# Patient Record
Sex: Female | Born: 1975
Health system: Southern US, Community
[De-identification: ages and names within clinical notes are randomized; demographics above are authoritative.]

## PROBLEM LIST (undated history)

## (undated) DIAGNOSIS — J45909 Unspecified asthma, uncomplicated: Secondary | ICD-10-CM

## (undated) DIAGNOSIS — N3010 Interstitial cystitis (chronic) without hematuria: Secondary | ICD-10-CM

## (undated) DIAGNOSIS — K589 Irritable bowel syndrome without diarrhea: Secondary | ICD-10-CM

## (undated) DIAGNOSIS — R51 Headache: Secondary | ICD-10-CM

## (undated) DIAGNOSIS — M1990 Unspecified osteoarthritis, unspecified site: Secondary | ICD-10-CM

## (undated) DIAGNOSIS — R519 Headache, unspecified: Secondary | ICD-10-CM

## (undated) DIAGNOSIS — K219 Gastro-esophageal reflux disease without esophagitis: Secondary | ICD-10-CM

## (undated) DIAGNOSIS — R102 Pelvic and perineal pain: Secondary | ICD-10-CM

## (undated) DIAGNOSIS — Z803 Family history of malignant neoplasm of breast: Secondary | ICD-10-CM

## (undated) DIAGNOSIS — M542 Cervicalgia: Secondary | ICD-10-CM

## (undated) DIAGNOSIS — T7840XA Allergy, unspecified, initial encounter: Secondary | ICD-10-CM

## (undated) DIAGNOSIS — I10 Essential (primary) hypertension: Secondary | ICD-10-CM

## (undated) DIAGNOSIS — R011 Cardiac murmur, unspecified: Secondary | ICD-10-CM

## (undated) DIAGNOSIS — E785 Hyperlipidemia, unspecified: Secondary | ICD-10-CM

## (undated) DIAGNOSIS — N809 Endometriosis, unspecified: Secondary | ICD-10-CM

## (undated) DIAGNOSIS — G8929 Other chronic pain: Secondary | ICD-10-CM

## (undated) HISTORY — PX: TUBAL LIGATION: SHX77

## (undated) HISTORY — DX: Headache: R51

## (undated) HISTORY — DX: Unspecified osteoarthritis, unspecified site: M19.90

## (undated) HISTORY — DX: Pelvic and perineal pain: R10.2

## (undated) HISTORY — DX: Other chronic pain: G89.29

## (undated) HISTORY — DX: Hyperlipidemia, unspecified: E78.5

## (undated) HISTORY — DX: Headache, unspecified: R51.9

## (undated) HISTORY — DX: Interstitial cystitis (chronic) without hematuria: N30.10

## (undated) HISTORY — DX: Gastro-esophageal reflux disease without esophagitis: K21.9

## (undated) HISTORY — DX: Essential (primary) hypertension: I10

## (undated) HISTORY — DX: Irritable bowel syndrome, unspecified: K58.9

## (undated) HISTORY — DX: Cervicalgia: M54.2

## (undated) HISTORY — DX: Endometriosis, unspecified: N80.9

## (undated) HISTORY — DX: Family history of malignant neoplasm of breast: Z80.3

## (undated) HISTORY — DX: Cardiac murmur, unspecified: R01.1

## (undated) HISTORY — DX: Unspecified asthma, uncomplicated: J45.909

## (undated) HISTORY — PX: CHOLECYSTECTOMY: SHX55

## (undated) HISTORY — DX: Allergy, unspecified, initial encounter: T78.40XA

---

## 2001-12-27 ENCOUNTER — Other Ambulatory Visit: Admission: RE | Admit: 2001-12-27 | Discharge: 2001-12-27 | Payer: Self-pay | Admitting: Obstetrics & Gynecology

## 2004-04-10 HISTORY — PX: LAPAROSCOPY: SHX197

## 2004-08-31 ENCOUNTER — Ambulatory Visit: Payer: Self-pay | Admitting: Unknown Physician Specialty

## 2004-11-10 ENCOUNTER — Ambulatory Visit: Payer: Self-pay | Admitting: Unknown Physician Specialty

## 2005-08-21 ENCOUNTER — Emergency Department: Payer: Self-pay | Admitting: Emergency Medicine

## 2009-05-17 ENCOUNTER — Ambulatory Visit: Payer: Self-pay | Admitting: Obstetrics and Gynecology

## 2009-08-27 ENCOUNTER — Emergency Department: Payer: Self-pay | Admitting: Emergency Medicine

## 2009-09-04 ENCOUNTER — Emergency Department (HOSPITAL_COMMUNITY): Admission: EM | Admit: 2009-09-04 | Discharge: 2009-09-05 | Payer: Self-pay | Admitting: Emergency Medicine

## 2009-10-13 ENCOUNTER — Ambulatory Visit: Payer: Self-pay | Admitting: Gastroenterology

## 2009-10-15 LAB — PATHOLOGY REPORT

## 2010-01-05 ENCOUNTER — Ambulatory Visit: Payer: Self-pay | Admitting: Gastroenterology

## 2010-08-04 ENCOUNTER — Other Ambulatory Visit: Payer: Self-pay | Admitting: Obstetrics & Gynecology

## 2010-08-04 DIAGNOSIS — N939 Abnormal uterine and vaginal bleeding, unspecified: Secondary | ICD-10-CM

## 2010-08-17 ENCOUNTER — Ambulatory Visit (HOSPITAL_COMMUNITY)
Admission: RE | Admit: 2010-08-17 | Discharge: 2010-08-17 | Disposition: A | Discharge status: Home / Self Care | Payer: BC Managed Care – PPO | Source: Ambulatory Visit | Attending: Obstetrics & Gynecology | Admitting: Obstetrics & Gynecology

## 2010-08-17 ENCOUNTER — Ambulatory Visit (HOSPITAL_COMMUNITY): Payer: BC Managed Care – PPO

## 2010-08-17 DIAGNOSIS — N925 Other specified irregular menstruation: Secondary | ICD-10-CM | POA: Insufficient documentation

## 2010-08-17 DIAGNOSIS — N803 Endometriosis of pelvic peritoneum, unspecified: Secondary | ICD-10-CM | POA: Insufficient documentation

## 2010-08-17 DIAGNOSIS — N938 Other specified abnormal uterine and vaginal bleeding: Secondary | ICD-10-CM | POA: Insufficient documentation

## 2010-08-17 DIAGNOSIS — N949 Unspecified condition associated with female genital organs and menstrual cycle: Secondary | ICD-10-CM | POA: Insufficient documentation

## 2010-08-17 DIAGNOSIS — N939 Abnormal uterine and vaginal bleeding, unspecified: Secondary | ICD-10-CM

## 2010-08-23 NOTE — Assessment & Plan Note (Signed)
NAME:  Anita Weaver, Anita Weaver                   ACCOUNT NO.:  [id number]   MEDICAL RECORD NO.:  [id number]          PATIENT TYPE:  POB   LOCATION:  [facility]         FACILITY:  [facility]   PHYSICIAN:  Argentina Donovan, MD        DATE OF BIRTH:  November 21, 1975   DATE OF SERVICE:  05/17/2009                                  CLINIC NOTE   The patient is a 35 year old Caucasian female gravida 3, para 3-0-0-3  with youngest child is 10, who had a tubal ligation after the birth of  her youngest child who is 10.  The patient in 2006 had a laparoscopy for  chronic pelvic pain and gallbladder disease.  She had a gallbladder  removed at that time.  At that point, she had a tubal ligation by  laparoscopy and was told by the gynecologist then that she had severe  endometriosis.  At that point she was only having painful periods but  that has developed since then into chronic pelvic pain even in between  periods as well as much more discomfort with her period.  Her pain is  localized to the lower abdomen bilaterally and it radiates to the lower  back.   PHYSICAL EXAMINATION:  ABDOMEN:  Soft, flat, slightly tender to deep  palpation in the lower abdomen but no guarding or rebound.  GU:  External genitalia is normal.  BUS within normal limits.  Vagina is  clean and well rugated.  Cervix clean and parous, it has been far off  anterior.  Bimanual pelvic examination, the uterosacral ligaments are  both covered with palpable nodules although the cul-de-sac seemed like  it is free and the uterus is in first-degree retroversion.  Adnexa could  not be outlined.   My plan is to obtain an ultrasound on this patient, we obtained the  records of her laparoscopy.  She also states that the other doctor has  photographs that were taken at the time of her laparoscopy which she is  going to obtain herself and I am going to have her come back in 2 weeks  for evaluation by one of her doctors at the surgery at this point.  I  talked to the patient about treatments and at the fact that it probably  would be best off with total abdominal hysterectomy and possibly removal  of the ovaries.  We talked about the pros and cons of removing the  ovaries in detail and if one is left often times, it means a second  operation down the line.  She does not seem like she wants that done.  I  have also told her that sometimes we will use nipple Lupron to soften  things up and make the surgery a little bit easier possibly and  preparation for the surgery especially if the ultrasound is  significantly abnormal.  She apparently has researched endometriosis  quite a bit since she was told she had and seems fairly educated to the  disease.   Impression is severe symptomatic pelvic endometriosis, probably needs  treatment with eventual hysterectomy.           ______________________________  Loistine Chance  Okey Dupre, MD     PR/MEDQ  D:  05/17/2009  T:  05/18/2009  Job:  [id number]

## 2011-08-23 ENCOUNTER — Other Ambulatory Visit: Payer: Self-pay | Admitting: Neurology

## 2011-08-23 DIAGNOSIS — R51 Headache: Secondary | ICD-10-CM

## 2011-09-02 ENCOUNTER — Ambulatory Visit
Admission: RE | Admit: 2011-09-02 | Discharge: 2011-09-02 | Disposition: A | Discharge status: Home / Self Care | Payer: BC Managed Care – PPO | Source: Ambulatory Visit | Attending: Neurology | Admitting: Neurology

## 2011-09-02 DIAGNOSIS — R51 Headache: Secondary | ICD-10-CM

## 2011-09-08 ENCOUNTER — Ambulatory Visit: Payer: Self-pay | Admitting: Internal Medicine

## 2011-11-01 ENCOUNTER — Ambulatory Visit: Payer: Self-pay

## 2011-11-01 LAB — RAPID STREP-A WITH REFLX: Micro Text Report: NEGATIVE

## 2011-11-04 LAB — BETA STREP CULTURE(ARMC)

## 2011-11-11 ENCOUNTER — Ambulatory Visit: Payer: Self-pay | Admitting: Internal Medicine

## 2011-11-11 LAB — RAPID STREP-A WITH REFLX: Micro Text Report: NEGATIVE

## 2011-11-14 LAB — BETA STREP CULTURE(ARMC)

## 2011-12-25 ENCOUNTER — Emergency Department: Payer: Self-pay | Admitting: Emergency Medicine

## 2012-01-12 ENCOUNTER — Encounter: Payer: BC Managed Care – PPO | Admitting: Obstetrics and Gynecology

## 2012-03-19 ENCOUNTER — Other Ambulatory Visit: Payer: BC Managed Care – PPO

## 2012-03-26 ENCOUNTER — Ambulatory Visit: Payer: BC Managed Care – PPO | Admitting: Family Medicine

## 2012-03-29 ENCOUNTER — Ambulatory Visit: Payer: BC Managed Care – PPO | Admitting: Obstetrics and Gynecology

## 2012-03-29 DIAGNOSIS — N899 Noninflammatory disorder of vagina, unspecified: Secondary | ICD-10-CM

## 2012-04-08 ENCOUNTER — Ambulatory Visit: Payer: BC Managed Care – PPO | Admitting: Family Medicine

## 2012-04-08 DIAGNOSIS — N899 Noninflammatory disorder of vagina, unspecified: Secondary | ICD-10-CM

## 2012-04-18 ENCOUNTER — Ambulatory Visit (INDEPENDENT_AMBULATORY_CARE_PROVIDER_SITE_OTHER): Payer: BC Managed Care – PPO | Admitting: Obstetrics and Gynecology

## 2012-04-18 ENCOUNTER — Encounter: Payer: Self-pay | Admitting: Obstetrics and Gynecology

## 2012-04-18 VITALS — BP 117/83 | HR 87 | Ht 68.0 in | Wt 155.0 lb

## 2012-04-18 DIAGNOSIS — G8929 Other chronic pain: Secondary | ICD-10-CM | POA: Insufficient documentation

## 2012-04-18 DIAGNOSIS — N949 Unspecified condition associated with female genital organs and menstrual cycle: Secondary | ICD-10-CM

## 2012-04-18 DIAGNOSIS — N809 Endometriosis, unspecified: Secondary | ICD-10-CM | POA: Insufficient documentation

## 2012-04-18 DIAGNOSIS — N760 Acute vaginitis: Secondary | ICD-10-CM

## 2012-04-18 LAB — POCT URINALYSIS DIPSTICK
Bilirubin, UA: NEGATIVE
Glucose, UA: NEGATIVE
Ketones, UA: NEGATIVE
Leukocytes, UA: NEGATIVE
Nitrite, UA: NEGATIVE
Protein, UA: NEGATIVE
Spec Grav, UA: 1.015
Urobilinogen, UA: 0.2
pH, UA: 5

## 2012-04-18 MED ORDER — MEDROXYPROGESTERONE ACETATE 150 MG/ML IM SUSP: 150.0000 mg | INTRAMUSCULAR | Status: DC

## 2012-04-18 MED ORDER — HYDROCODONE-ACETAMINOPHEN 5-500 MG PO TABS: 1.0000 | ORAL_TABLET | Freq: Four times a day (QID) | ORAL | Status: DC | PRN

## 2012-04-18 NOTE — Progress Notes (Signed)
Patient ID: Chavie Kolinski, female   DOB: [birth year], 37 y.o.   MRN: [id number] 37 yo G3P3 with LMP 12/23 presenting today for evaluation of pelvic pain and vaginal discharge. Patient reports onset of vaginal pruritis last week with the presence of a discharge with odor. Patient is in a monogamous relationship using BTL for birth control. Patient also reports h/o endometriosis diagnosed at the time of her tubal ligation in 2006. Patient states OCP did not work to manage her pain. She took depo-Lupron for 1 year but could not tolerate the side effects. Patient was planning on using Mirena IUD but could not afford the deductible. Patient states her pain use to be worst at the time of her period but now is Rohin Krejci and aggravated with her periods.  GENERAL: Well-developed, well-nourished female in no acute distress.  ABDOMEN: Soft, nontender, nondistended. No organomegaly. PELVIC: Normal external female genitalia. Vagina is pink and rugated.  Normal discharge. Normal appearing cervix. Uterus is normal in size and tender to touch. No adnexal mass or tenderness. EXTREMITIES: No cyanosis, clubbing, or edema, 2+ distal pulses.  A/P 37 yo with endometriosis and pelvic pain - Wet prep collected for her vaginitis - Long discussion regarding the use of birth control method to better manage her endometriosis- Patient is willing to try depo-provera while saving up for Mirena IUD -Rx Vicodin provided - Patient understands that definitive treatment involves hysterectomy with BSO. - Patient will be contacted with any abnormal results. -UA showed moderate blood in urine. Patient states all her urine test always show blood in her urine. Advised to follow up with PCP for possible urology eval.

## 2012-04-19 LAB — WET PREP, GENITAL
Trich, Wet Prep: NONE SEEN
Yeast Wet Prep HPF POC: NONE SEEN

## 2012-04-20 LAB — URINE CULTURE: Colony Count: 2000

## 2012-04-23 MED ORDER — METRONIDAZOLE 500 MG PO TABS: 500.0000 mg | ORAL_TABLET | Freq: Two times a day (BID) | ORAL | Status: DC

## 2012-04-23 NOTE — Addendum Note (Signed)
Addended by: Catalina Antigua on: 04/23/2012 11:40 AM   Modules accepted: Orders

## 2012-04-25 ENCOUNTER — Telehealth: Payer: Self-pay | Admitting: *Deleted

## 2012-04-25 DIAGNOSIS — B9689 Other specified bacterial agents as the cause of diseases classified elsewhere: Secondary | ICD-10-CM

## 2012-04-25 DIAGNOSIS — N760 Acute vaginitis: Secondary | ICD-10-CM

## 2012-04-25 MED ORDER — METRONIDAZOLE 500 MG PO TABS: 500.0000 mg | ORAL_TABLET | Freq: Two times a day (BID) | ORAL | Status: DC

## 2012-04-25 NOTE — Telephone Encounter (Signed)
Patient needs medication called into [facility] instead of [facility].

## 2012-07-18 ENCOUNTER — Ambulatory Visit: Payer: BC Managed Care – PPO | Admitting: Obstetrics & Gynecology

## 2012-08-05 ENCOUNTER — Ambulatory Visit (INDEPENDENT_AMBULATORY_CARE_PROVIDER_SITE_OTHER): Payer: BC Managed Care – PPO | Admitting: Family Medicine

## 2012-08-05 ENCOUNTER — Encounter: Payer: Self-pay | Admitting: Family Medicine

## 2012-08-05 VITALS — BP 136/85 | HR 73 | Ht 68.0 in | Wt 165.0 lb

## 2012-08-05 DIAGNOSIS — N809 Endometriosis, unspecified: Secondary | ICD-10-CM

## 2012-08-05 DIAGNOSIS — R102 Pelvic and perineal pain: Secondary | ICD-10-CM

## 2012-08-05 DIAGNOSIS — N898 Other specified noninflammatory disorders of vagina: Secondary | ICD-10-CM

## 2012-08-05 DIAGNOSIS — G8929 Other chronic pain: Secondary | ICD-10-CM

## 2012-08-05 DIAGNOSIS — N949 Unspecified condition associated with female genital organs and menstrual cycle: Secondary | ICD-10-CM

## 2012-08-05 MED ORDER — SULINDAC 200 MG PO TABS: 200.0000 mg | ORAL_TABLET | Freq: Two times a day (BID) | ORAL | Status: DC

## 2012-08-05 MED ORDER — NORETHINDRONE ACETATE 5 MG PO TABS: 5.0000 mg | ORAL_TABLET | Freq: Every day | ORAL | Status: DC

## 2012-08-05 MED ORDER — METRONIDAZOLE 500 MG PO TABS: 500.0000 mg | ORAL_TABLET | Freq: Two times a day (BID) | ORAL | Status: AC

## 2012-08-05 NOTE — Assessment & Plan Note (Signed)
Check wet prep--rx for flagyl given.  Ways to avoid in the future discussed.

## 2012-08-05 NOTE — Progress Notes (Signed)
Discuss treatment for endometriosis and also believes bacterial vaginitis is back.

## 2012-08-05 NOTE — Patient Instructions (Addendum)
Bacterial Vaginosis Bacterial vaginosis (BV) is a vaginal infection where the normal balance of bacteria in the vagina is disrupted. The normal balance is then replaced by an overgrowth of certain bacteria. There are several different kinds of bacteria that can cause BV. BV is the most common vaginal infection in women of childbearing age. CAUSES   The cause of BV is not fully understood. BV develops when there is an increase or imbalance of harmful bacteria.  Some activities or behaviors can upset the normal balance of bacteria in the vagina and put women at increased risk including:  Having a new sex partner or multiple sex partners.  Douching.  Using an intrauterine device (IUD) for contraception.  It is not clear what role sexual activity plays in the development of BV. However, women that have never had sexual intercourse are rarely infected with BV. Women do not get BV from toilet seats, bedding, swimming pools or from touching objects around them.  SYMPTOMS   Grey vaginal discharge.  A fish-like odor with discharge, especially after sexual intercourse.  Itching or burning of the vagina and vulva.  Burning or pain with urination.  Some women have no signs or symptoms at all. DIAGNOSIS  Your caregiver must examine the vagina for signs of BV. Your caregiver will perform lab tests and look at the sample of vaginal fluid through a microscope. They will look for bacteria and abnormal cells (clue cells), a pH test higher than 4.5, and a positive amine test all associated with BV.  RISKS AND COMPLICATIONS   Pelvic inflammatory disease (PID).  Infections following gynecology surgery.  Developing HIV.  Developing herpes virus. TREATMENT  Sometimes BV will clear up without treatment. However, all women with symptoms of BV should be treated to avoid complications, especially if gynecology surgery is planned. Female partners generally do not need to be treated. However, BV may spread  between female sex partners so treatment is helpful in preventing a recurrence of BV.   BV may be treated with antibiotics. The antibiotics come in either pill or vaginal cream forms. Either can be used with nonpregnant or pregnant women, but the recommended dosages differ. These antibiotics are not harmful to the baby.  BV can recur after treatment. If this happens, a second round of antibiotics will often be prescribed.  Treatment is important for pregnant women. If not treated, BV can cause a premature delivery, especially for a pregnant woman who had a premature birth in the past. All pregnant women who have symptoms of BV should be checked and treated.  For chronic reoccurrence of BV, treatment with a type of prescribed gel vaginally twice a week is helpful. HOME CARE INSTRUCTIONS   Finish all medication as directed by your caregiver.  Do not have sex until treatment is completed.  Tell your sexual partner that you have a vaginal infection. They should see their caregiver and be treated if they have problems, such as a mild rash or itching.  Practice safe sex. Use condoms. Only have 1 sex partner. PREVENTION  Basic prevention steps can help reduce the risk of upsetting the natural balance of bacteria in the vagina and developing BV:  Do not have sexual intercourse (be abstinent).  Do not douche.  Use all of the medicine prescribed for treatment of BV, even if the signs and symptoms go away.  Tell your sex partner if you have BV. That way, they can be treated, if needed, to prevent reoccurrence. SEEK MEDICAL CARE IF:     Your symptoms are not improving after 3 days of treatment.  You have increased discharge, pain, or fever. MAKE SURE YOU:   Understand these instructions.  Will watch your condition.  Will get help right away if you are not doing well or get worse. FOR MORE INFORMATION  Division of STD Prevention (DSTDP), Centers for Disease Control and Prevention:  SolutionApps.co.za American Social Health Association (ASHA): www.ashastd.org  Document Released: 03/27/2005 Document Revised: 06/19/2011 Document Reviewed: 09/17/2008 [facility] Patient Information 2013 [place], [place]. Endometriosis Endometriosis is a disease that occurs when the endometrium (lining of the uterus) is misplaced outside of its normal location. It may occur in many locations close to the uterus (womb), but commonly on the ovaries, fallopian tubes, vagina (birth canal) and bowel located close to the uterus. Because the uterus sloughs (expels) its lining every month (menses), there is bleeding whereever the endometrial tissue is located. SYMPTOMS  Often there are no symptoms. However, because blood is irritating to tissues not normally exposed to it, when symptoms occur they vary with the location of the misplaced endometrium. Symptoms often include back and abdominal pain. Periods may be heavier and intercourse may be painful. Infertility may be present. You may have all of these symptoms at one time or another or you may have months with no symptoms at all. Although the symptoms occur mainly during menses, they can occur mid-cycle as well, and usually terminate with menopause. DIAGNOSIS  Your caregiver may recommend a blood test and urine test (urinalysis) to help rule out other conditions. Another common test is ultrasound, a painless procedure that uses sound waves to make a sonogram "picture" of abnormal tissue that could be endometriosis. If your bowel movements are painful around your periods, your caregiver may advise a barium enema (an X-ray of the lower bowel), to try to find the source of your pain. This is sometimes confirmed by laparoscopy. Laparoscopy is a procedure where your caregiver looks into your abdomen with a laparoscope (a small pencil sized telescope). Your caregiver may take a tiny piece of tissue (biopsy) from any abnormal tissue to confirm or document your problem. These  tissues are sent to the lab and a pathologist looks at them under the microscope to give a microscopic diagnosis. TREATMENT  Once the diagnosis is made, it can be treated by destruction of the misplaced endometrial tissue using heat (diathermy), laser, cutting (excision), or chemical means. It may also be treated with hormonal therapy. When using hormonal therapy menses are eliminated, therefore eliminating the monthly exposure to blood by the misplaced endometrial tissue. Only in severe cases is it necessary to perform a hysterectomy with removal of the tubes, uterus and ovaries. HOME CARE INSTRUCTIONS  Only take over-the-counter or prescription medicines for pain, discomfort, or fever as directed by your caregiver. Avoid activities that produce pain, including a physical sexual relationship. Do not take aspirin as this may increase bleeding when not on hormonal therapy. See your caregiver for pain or problems not controlled with treatment. SEEK IMMEDIATE MEDICAL CARE IF:  Your pain is severe and is not responding to pain medicine. You develop severe nausea and vomiting, or you cannot keep foods down. Your pain localizes to the right lower part of your abdomen (possible appendicitis). You have swelling or increasing pain in the abdomen. You have a fever. You see blood in your stool. MAKE SURE YOU:  Understand these instructions. Will watch your condition. Will get help right away if you are not doing well or get worse. Document  Released: 03/24/2000 Document Revised: 06/19/2011 Document Reviewed: 11/13/2007 [facility] Patient Information 2013 [place], [place].

## 2012-08-05 NOTE — Progress Notes (Signed)
  Subjective:    Patient ID: Anita Weaver, female    DOB: 1975/04/15, 37 y.o.   MRN: [id number]  HPI Here today with 2 issues 1.  Vaginal irritation, and odor.  Like prev. BV.  Frequent sit-down baths. 2.  Endometriosis-prev. Been on and not tolerated Depo-Lupron, considering Mirena IUD, not wanting to have high co-pay.  Recent worsening of pain, previously cyclical, now more prominent.  On Ibuprofen and likely taking too much. Has IC as well, but unwilling to go for further treatment of this.   Review of Systems  Constitutional: Negative for fever and chills.  Respiratory: Negative for shortness of breath.   Gastrointestinal: Positive for abdominal pain.  Genitourinary: Positive for dysuria and dyspareunia.  Skin: Negative for rash.       Objective:   Physical Exam  Vitals reviewed. Constitutional: She is oriented to person, place, and time. She appears well-developed and well-nourished.  HENT:  Head: Normocephalic and atraumatic.  Eyes: No scleral icterus.  Neck: Neck supple.  Cardiovascular: Normal rate.   Abdominal: Soft. There is no tenderness.  Genitourinary: Vagina normal. Uterus is tender. Right adnexum displays tenderness. Right adnexum displays no mass. Left adnexum displays tenderness. Left adnexum displays no mass.  Neurological: She is alert and oriented to person, place, and time.          Assessment & Plan:

## 2012-08-05 NOTE — Assessment & Plan Note (Signed)
Trial of Aygestin, change to twice daily dosing of NSAIDS to decrease damage done.  She will consider long-term plan of Hyst + ovarian removal.  Likelihood of success discussed with IC and long-term pain.

## 2012-08-06 LAB — WET PREP, GENITAL
Clue Cells Wet Prep HPF POC: NONE SEEN
Trich, Wet Prep: NONE SEEN
Yeast Wet Prep HPF POC: NONE SEEN

## 2012-12-12 ENCOUNTER — Ambulatory Visit (INDEPENDENT_AMBULATORY_CARE_PROVIDER_SITE_OTHER): Payer: Self-pay | Admitting: Obstetrics & Gynecology

## 2012-12-12 ENCOUNTER — Encounter: Payer: Self-pay | Admitting: Obstetrics & Gynecology

## 2012-12-12 VITALS — BP 123/93 | HR 66 | Ht 68.0 in | Wt 170.0 lb

## 2012-12-12 DIAGNOSIS — N898 Other specified noninflammatory disorders of vagina: Secondary | ICD-10-CM

## 2012-12-12 DIAGNOSIS — N390 Urinary tract infection, site not specified: Secondary | ICD-10-CM

## 2012-12-12 DIAGNOSIS — B373 Candidiasis of vulva and vagina: Secondary | ICD-10-CM

## 2012-12-12 LAB — POCT URINALYSIS DIPSTICK
Bilirubin, UA: NEGATIVE
Glucose, UA: NEGATIVE
Ketones, UA: NEGATIVE
Nitrite, UA: NEGATIVE
Protein, UA: NEGATIVE
Spec Grav, UA: >=1.03
Urobilinogen, UA: NEGATIVE
pH, UA: 6

## 2012-12-12 NOTE — Progress Notes (Signed)
GYNECOLOGY CLINIC ENCOUNTER NOTE  History:  37 y.o. G3P3 here today for evaluation of malodorous vaginal discharge x 3 weeks. Denies itching. No other symptoms.  The following portions of the patient's history were reviewed and updated as appropriate: allergies, current medications, past family history, past medical history, past social history, past surgical history and problem list. No pap smear recently.  Review of Systems:  Pertinent items are noted in HPI.  Objective:  Physical Exam BP 123/93  Pulse 66  Ht 5\' 8"  (1.727 m)  Wt 170 lb (77.111 kg)  BMI 25.85 kg/m2  LMP 12/02/2012 Gen: NAD Abd: Soft, nontender and nondistended Pelvic: Normal appearing external genitalia; normal appearing vaginal mucosa and cervix.  Small amount of white, nonodorous vaginal discharge noted, sample obtained for wet prep.  Small uterus, no other palpable masses, no uterine or adnexal tenderness  Assessment & Plan:  Will follow up results and manage accordingly. Proper vulvar hygiene emphasized: discussed avoidance of perfumed soaps, detergents, lotions and any type of douches; in addition to wearing cotton underwear and no underwear at night.   Routine preventative health maintenance measures emphasized, patient will schedule annual physical visit soon.

## 2012-12-12 NOTE — Addendum Note (Signed)
Addended by: Barbara Cower on: 12/12/2012 02:56 PM   Modules accepted: Orders

## 2012-12-12 NOTE — Patient Instructions (Signed)
Return to clinic for any scheduled appointments or for any gynecologic concerns as needed.   

## 2012-12-13 LAB — WET PREP, GENITAL
Clue Cells Wet Prep HPF POC: NONE SEEN
Trich, Wet Prep: NONE SEEN
WBC, Wet Prep HPF POC: NONE SEEN

## 2012-12-13 LAB — URINE CULTURE
Colony Count: NO GROWTH
Organism ID, Bacteria: NO GROWTH

## 2012-12-13 MED ORDER — FLUCONAZOLE 150 MG PO TABS: 150.0000 mg | ORAL_TABLET | Freq: Once | ORAL | Status: DC

## 2012-12-13 NOTE — Addendum Note (Signed)
Addended by: Jaynie Collins A on: 12/13/2012 06:01 PM   Modules accepted: Orders

## 2012-12-16 ENCOUNTER — Telehealth: Payer: Self-pay | Admitting: *Deleted

## 2012-12-16 NOTE — Telephone Encounter (Signed)
Patient given test results and she will pick up medication at her pharmacy for Diflucan.

## 2013-03-03 ENCOUNTER — Emergency Department: Payer: Self-pay | Admitting: Emergency Medicine

## 2013-03-03 LAB — URINALYSIS, COMPLETE
Bilirubin,UR: NEGATIVE
Glucose,UR: NEGATIVE mg/dL (ref 0–75)
Ketone: NEGATIVE
Leukocyte Esterase: NEGATIVE
Nitrite: NEGATIVE
Ph: 7 (ref 4.5–8.0)
Protein: NEGATIVE
RBC,UR: 9 /HPF (ref 0–5)
Specific Gravity: 1.021 (ref 1.003–1.030)
Squamous Epithelial: 2
WBC UR: 2 /HPF (ref 0–5)

## 2013-03-03 LAB — CBC WITH DIFFERENTIAL/PLATELET
Basophil #: 0 10*3/uL (ref 0.0–0.1)
Basophil %: 0.6 %
Eosinophil #: 0.2 10*3/uL (ref 0.0–0.7)
Eosinophil %: 2.8 %
HCT: 40.2 % (ref 35.0–47.0)
HGB: 13.8 g/dL (ref 12.0–16.0)
Lymphocyte #: 2.9 10*3/uL (ref 1.0–3.6)
Lymphocyte %: 36.6 %
MCH: 29.3 pg (ref 26.0–34.0)
MCHC: 34.4 g/dL (ref 32.0–36.0)
MCV: 85 fL (ref 80–100)
Monocyte #: 0.6 x10 3/mm (ref 0.2–0.9)
Monocyte %: 7.8 %
Neutrophil #: 4.1 10*3/uL (ref 1.4–6.5)
Neutrophil %: 52.2 %
Platelet: 288 10*3/uL (ref 150–440)
RBC: 4.71 10*6/uL (ref 3.80–5.20)
RDW: 13.3 % (ref 11.5–14.5)
WBC: 7.8 10*3/uL (ref 3.6–11.0)

## 2013-03-03 LAB — LIPASE, BLOOD: Lipase: 144 U/L (ref 73–393)

## 2013-03-03 LAB — COMPREHENSIVE METABOLIC PANEL
Albumin: 3.8 g/dL (ref 3.4–5.0)
Alkaline Phosphatase: 41 U/L — ABNORMAL LOW
Anion Gap: 6 — ABNORMAL LOW (ref 7–16)
BUN: 11 mg/dL (ref 7–18)
Bilirubin,Total: 0.4 mg/dL (ref 0.2–1.0)
Calcium, Total: 8.8 mg/dL (ref 8.5–10.1)
Chloride: 105 mmol/L (ref 98–107)
Co2: 28 mmol/L (ref 21–32)
Creatinine: 0.88 mg/dL (ref 0.60–1.30)
EGFR (African American): >60
EGFR (Non-African Amer.): >60
Glucose: 106 mg/dL — ABNORMAL HIGH (ref 65–99)
Osmolality: 277 (ref 275–301)
Potassium: 3.7 mmol/L (ref 3.5–5.1)
SGOT(AST): 8 U/L — ABNORMAL LOW (ref 15–37)
SGPT (ALT): 15 U/L (ref 12–78)
Sodium: 139 mmol/L (ref 136–145)
Total Protein: 7.3 g/dL (ref 6.4–8.2)

## 2013-03-03 LAB — GC/CHLAMYDIA PROBE AMP

## 2013-03-03 LAB — WET PREP, GENITAL

## 2013-03-05 LAB — URINE CULTURE

## 2013-05-08 ENCOUNTER — Ambulatory Visit (INDEPENDENT_AMBULATORY_CARE_PROVIDER_SITE_OTHER): Payer: 59 | Admitting: Neurology

## 2013-05-08 ENCOUNTER — Encounter: Payer: Self-pay | Admitting: Neurology

## 2013-05-08 ENCOUNTER — Encounter (INDEPENDENT_AMBULATORY_CARE_PROVIDER_SITE_OTHER): Payer: Self-pay

## 2013-05-08 VITALS — BP 110/77 | HR 79 | Ht 67.0 in | Wt 170.0 lb

## 2013-05-08 DIAGNOSIS — G43909 Migraine, unspecified, not intractable, without status migrainosus: Secondary | ICD-10-CM

## 2013-05-08 DIAGNOSIS — F319 Bipolar disorder, unspecified: Secondary | ICD-10-CM

## 2013-05-08 MED ORDER — RIZATRIPTAN BENZOATE 10 MG PO TBDP: 10.0000 mg | ORAL_TABLET | ORAL | Status: DC | PRN

## 2013-05-08 MED ORDER — DIVALPROEX SODIUM ER 500 MG PO TB24: 500.0000 mg | ORAL_TABLET | Freq: Every day | ORAL | Status: DC

## 2013-05-08 NOTE — Progress Notes (Signed)
[facility]  PATIENT: Anita Weaver DOB: 01/15/1976  HISTORICAL  Anita Weaver is a 38 years old right-handed Caucasian female, accompanied by her husband, referred by her primary care physician Dr. Manuella Ghazi for evaluation of migraine headaches  She reported a history of migraine headaches since age 64, at that time, she also has excessive stress, for a while, she been having headaches daily, since her separation with her previous husband, her headache has much improved, doing very well from 2013-2014, but since October 2014, she began to get migraine again, gradually getting worse, to almost daily basis, she has been taking Excedrin Migraine every day 2-4 tablets, her migraine headaches are usually lateralized severe pounding headaches with associated light noise sensitivity, nauseous, lasting for days,  Previously she has tried different preventive medications including Topamax, moderate help, nortriptyline, no help, there was an 1 medication prescribed by her psychologist for her bipolar disorder, has been very helpful for him migraine, but she could not remember the name  Over-the-counter Tylenol, ibuprofen and is no longer helpful,  Excedrin migraine only provide mild temporary relief, she has tried triptan treatment in the past, Imitrex causing heart racing, sweaty, difficulty breathing, Maxalt has been very helpful, but she was not able afford it because the high co-pay, Flexeril has  Made her sleepy  Trigger for her migraines, weather change, stress, caffeine, sleep deprivation  She had tubal ligations now, she also complains of worsening depression over the past few months, will set up appointment with her psychologist, she is not not on any medications   REVIEW OF SYSTEMS: Full 14 system review of systems performed and notable only for urination problems, eye pain, cough, easy bruising, achy muscles, energy, headaches, insomnia, restless leg, depression, anxiety, not enough  sleep  ALLERGIES: Allergies  Allergen Reactions  . Demerol [Meperidine]     HOME MEDICATIONS: No outpatient prescriptions prior to visit.   No facility-administered medications prior to visit.    PAST MEDICAL HISTORY: History reviewed. No pertinent past medical history.  PAST SURGICAL HISTORY: Past Surgical History  Procedure Laterality Date  . Cholecystectomy    . Tubal ligation      FAMILY HISTORY: Family History  Problem Relation Age of Onset  . Cancer Mother   . Cancer Sister     SOCIAL HISTORY:  History   Social History  . Marital Status: Unknown    Spouse Name: N/A    Number of Children: 3  . 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 Smoking status: Never Smoker   . Smokeless tobacco: Never Used  . Alcohol Use: No  . Drug Use: No  . Sexual Activity: Not on file   Other Topics Concern  . Not on file   Social History Narrative   Patient lives at home with [name]   Patient works full time.   Right handed.   Caffeine- none   Education- high school     PHYSICAL EXAM   Filed Vitals:   05/08/13 1055  BP: 110/77  Pulse: 79  Height: 5\' 7"  (1.702 m)  Weight: 170 lb (77.111 kg)    Not recorded    Body mass index is 26.62 kg/(m^2).   Generalized: In no acute distress  Neck: Supple, no carotid bruits   Cardiac: Regular rate rhythm  Pulmonary: Clear to auscultation bilaterally  Musculoskeletal: No deformity  Neurological examination  Mentation: Alert  oriented to time, place, history taking, and causual conversation, mild distress, sitting in darkroom  Cranial nerve II-XII: Pupils were equal round reactive to light extraocular movements were full, Visual field were full on confrontational test. Bilateral fundi were sharp.  Facial sensation and strength were normal. Hearing was intact to finger rubbing bilaterally. Uvula tongue midline.  head turning and shoulder shrug  and were normal and symmetric.Tongue protrusion into cheek strength was normal.  Motor: normal tone, bulk and strength.  Sensory: Intact to fine touch, pinprick, preserved vibratory sensation, and proprioception at toes.  Coordination: Normal finger to nose, heel-to-shin bilaterally there was no truncal ataxia  Gait: Rising up from seated position without assistance, normal stance, without trunk ataxia, moderate stride, good arm swing, smooth turning, able to perform tiptoe, and heel walking without difficulty.   Romberg signs: Negative  Deep tendon reflexes: Brachioradialis 2/2, biceps 2/2, triceps 2/2, patellar 2/2, Achilles 2/2, plantar responses were flexor bilaterally.   DIAGNOSTIC DATA (LABS, IMAGING, TESTING) - I reviewed patient records, labs, notes, testing and imaging myself where available.   ASSESSMENT AND PLAN   21 some years old Caucasian female, with history of migraine, bipolar disorder, presenting with frequent migraine headaches, normal neurological examination  1, starting preventive medications, Depakote ER 500 mg every night, potential side effects explained, she has tubal ligation,  2, Maxalt as needed,  3, return to clinic with Hoyle Sauer 2-3 months     Marcial Pacas, M.D. Ph.D.  [facility] [address] [place], [place] [address] [phone] 2 with equal a is on As you: Had a middle school as

## 2013-07-11 ENCOUNTER — Telehealth: Payer: Self-pay | Admitting: Neurology

## 2013-07-11 NOTE — Telephone Encounter (Signed)
Patient was taking Depakote ER for migraines-after patient did research on drug stated this drug is very bad for her liver--can another Rx be called in--patient would also like Rx called in for Maxalt with quantity of more than 15 pills--thank you.

## 2013-07-15 NOTE — Telephone Encounter (Signed)
Seems like she has tried and failed several preventative medications hence if Depakote ER is working for her I recommend she stay on it and discuss this with Dr. Krista Blue further after her return. Maxalt cannot be given more than 15 pills. Refill the standard prescription if she wants.

## 2013-07-15 NOTE — Telephone Encounter (Signed)
I called the patient back.  Got no answer.  Left message relaying Dr Clydene Fake response.

## 2013-08-01 ENCOUNTER — Telehealth: Payer: Self-pay | Admitting: Neurology

## 2013-08-01 MED ORDER — PROPRANOLOL HCL 40 MG PO TABS: 40.0000 mg | ORAL_TABLET | Freq: Two times a day (BID) | ORAL | Status: DC

## 2013-08-01 NOTE — Telephone Encounter (Signed)
I called the patient back.  Got no answer.  Left message saying we sent a new Rx to her pharmacy and also asked that she call us back to schedule an appt so other medication questions may be addressed.

## 2013-08-01 NOTE — Telephone Encounter (Signed)
I fail to reach patient, Please let her know, I have written Inderal 40mg  bid as migraine prevention, further medication questions will be addressed on her follow up visit.   Please schedule a follow up appt with Hoyle Sauer in her next available.

## 2013-08-01 NOTE — Telephone Encounter (Signed)
Pt called states she is having sharp pains in the back of her head and neck, she use to take Tizandine, Flexirel (Muscle relaxer). And wanted to know if this can be prescribed for this. Also pt states she can't take the divalproex (DEPAKOTE ER) 500 MG 24 hr tablet please see previous note concerning the medication and the research pt did on it. Pt wants to see if Dr. Krista Blue can prescribe something else in place of the Depakote. Please call pt concerning this matter, pt states if you call after 1:00 she will be at work and that you can leave a message on her phone. Thanks

## 2013-10-28 ENCOUNTER — Telehealth: Payer: Self-pay | Admitting: Neurology

## 2013-10-28 NOTE — Telephone Encounter (Signed)
I talked to [facility] and she is willing to see the patient. I will see with her if needed. Thanks.

## 2013-10-28 NOTE — Telephone Encounter (Signed)
Pt calling requesting an appt to come in for severe migraines. Pt was last seen by Dr. Krista Blue on 05/08/13 and instructed to f/u in 2-3 months with Hoyle Sauer, NP, per Dr. Krista Blue phone note on 08/01/13. Sending pt information to the [facility], Dr. Janann Colonel, with Dr. Krista Blue out of the office, could the pt come in for a f/u with one of the NP's, because neither one of the NP has saw the pt. Please advise

## 2013-10-29 NOTE — Telephone Encounter (Signed)
Called pt and left message for pt to call back to get an appt with Jinny Blossom, NP per Dr. Janann Colonel, [facility], because Dr. Krista Blue is out of the office, per phone note on 10/28/13.

## 2013-10-31 NOTE — Telephone Encounter (Signed)
Called pt and left message for pt to call back.

## 2013-10-31 NOTE — Telephone Encounter (Signed)
Patient returning call to [facility], please call her back, she is currently on her lunch break and can take call until 1 PM but then has to go back to work and will not be able to answer until after 5 PM.

## 2013-11-03 NOTE — Telephone Encounter (Signed)
Called pt to set up an appt per Dr. Janann Colonel, [facility] on 10/28/13, for pt to come in for sooner appt. I made an appt for the pt for 11/10/13 with Dr. Krista Blue. I advised the pt that if she has any other problems, questions or concerns to call the office. Pt verbalized understanding.

## 2013-11-03 NOTE — Telephone Encounter (Signed)
Patient requesting Anita Weaver return call @ [phone].  Please leave message with appt time and date on VM, unable to talk on phone at work.  Thanks

## 2013-11-04 ENCOUNTER — Ambulatory Visit: Payer: Self-pay | Admitting: Neurology

## 2013-11-10 ENCOUNTER — Encounter: Payer: Self-pay | Admitting: Neurology

## 2013-11-10 ENCOUNTER — Ambulatory Visit (INDEPENDENT_AMBULATORY_CARE_PROVIDER_SITE_OTHER): Payer: 59 | Admitting: Neurology

## 2013-11-10 VITALS — BP 119/76 | HR 79 | Ht 67.0 in | Wt 179.0 lb

## 2013-11-10 DIAGNOSIS — F319 Bipolar disorder, unspecified: Secondary | ICD-10-CM

## 2013-11-10 DIAGNOSIS — G43909 Migraine, unspecified, not intractable, without status migrainosus: Secondary | ICD-10-CM

## 2013-11-10 MED ORDER — LAMOTRIGINE 100 MG PO TABS: 100.0000 mg | ORAL_TABLET | Freq: Every day | ORAL | Status: DC

## 2013-11-10 MED ORDER — RIZATRIPTAN BENZOATE 10 MG PO TABS: 10.0000 mg | ORAL_TABLET | ORAL | Status: DC | PRN

## 2013-11-10 MED ORDER — LAMOTRIGINE 25 MG PO TABS: ORAL_TABLET | ORAL | Status: DC

## 2013-11-10 NOTE — Patient Instructions (Signed)
Magnesium oxide 400 mg twice a day Riboflavin  100 mg twice a day 

## 2013-11-10 NOTE — Progress Notes (Signed)
[facility]  PATIENT: Anita Weaver DOB: 04/27/1975  HISTORICAL  (Initial Visit Jan 2015)  Anita Weaver is a 38 years old right-handed Caucasian female, accompanied by her husband, referred by her primary care physician Dr. Manuella Ghazi for evaluation of migraine headaches  She reported a history of migraine headaches since age 38, at that time, she also has excessive stress, for a while, she been having headaches daily, since her separation with her previous husband, her headache has much improved, doing very well from 2013-2014, but since October 2014, she began to get migraine again, gradually getting worse, to almost daily basis, she has been taking Excedrin Migraine every day 2-4 tablets, her migraine headaches are usually lateralized severe pounding headaches with associated light noise sensitivity, nauseous, lasting for days.  Previously she has tried different preventive medications including Topamax, moderate help, nortriptyline, no help, there was an 1 medication prescribed by her psychologist for her bipolar disorder, Depakote has been very helpful for him migraine  Over-the-counter Tylenol, ibuprofen and is no longer helpful,  Excedrin migraine only provide mild temporary relief, she has tried triptan treatment in the past, Imitrex causing heart racing, sweaty, difficulty breathing, Maxalt has been very helpful, but she was not able afford it because the high co-pay, Flexeril has  dade her sleepy  Trigger for her migraines, weather change, stress, caffeine, sleep deprivation  She had tubal ligations now, she also complains of worsening depression over the past few months, will set up appointment with her psychologist, she is not not on any medications  UPDATE August 3rd 2015:  She came in with her husband today, She has tried Depakote ER 500mg  qhs for almost 2 months without helping, worry about side effect,  Inderal 40mg  bid did not help either.   She still complains of  headaches 20 days in a month, Maxalt works well, headache would go away in 30 minutes, she used it up all 15 tabs of Maxalt in one month.  She also complains of dizziness, spinning sensation, fall over, slurred speech  "i have a cyst on the back part of my brain"   REVIEW OF SYSTEMS: Full 14 system review of systems performed and notable only for urination problems, eye pain, cough, easy bruising, achy muscles, energy, headaches, insomnia, restless leg, depression, anxiety, not enough sleep  ALLERGIES: Allergies  Allergen Reactions  . Demerol [Meperidine]     HOME MEDICATIONS: Outpatient Prescriptions Prior to Visit  Medication Sig Dispense Refill  . divalproex (DEPAKOTE ER) 500 MG 24 hr tablet Take 1 tablet (500 mg total) by mouth at bedtime.  30 tablet  12  . ibuprofen (ADVIL,MOTRIN) 200 MG tablet Take 200 mg by mouth every 6 (six) hours as needed.      . rizatriptan (MAXALT-MLT) 10 MG disintegrating tablet Take 1 tablet (10 mg total) by mouth as needed for migraine. May repeat in 2 hours if needed  15 tablet  6  . propranolol (INDERAL) 40 MG tablet Take 1 tablet (40 mg total) by mouth 2 (two) times daily.  60 tablet  23   No facility-administered medications prior to visit.    PAST MEDICAL HISTORY: History reviewed. No pertinent past medical history.  PAST SURGICAL HISTORY: Past Surgical History  Procedure Laterality Date  . Cholecystectomy    . Tubal ligation      FAMILY HISTORY: Family History  Problem Relation Age of Onset  . Cancer Mother   . Cancer Sister     SOCIAL HISTORY:  History  Social History  . Marital Status: Unknown    Spouse Name: N/A    Number of Children: 3  . 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 Smoking status: Never Smoker   . Smokeless tobacco: Never Used  . Alcohol Use: No  . Drug Use: No  . Sexual Activity: Not on file   Other Topics Concern  . Not on file   Social  History Narrative   Patient lives at home with [name]   Patient works full time.   Right handed.   Caffeine- none   Education- high school     PHYSICAL EXAM   Filed Vitals:   11/10/13 0835  BP: 119/76  Pulse: 79  Height: 5\' 7"  (1.702 m)  Weight: 179 lb (81.194 kg)    Not recorded    Body mass index is 28.03 kg/(m^2).   Generalized: In no acute distress  Neck: Supple, no carotid bruits   Cardiac: Regular rate rhythm  Pulmonary: Clear to auscultation bilaterally  Musculoskeletal: No deformity  Neurological examination  Mentation: Alert oriented to time, place, history taking, and causual conversation, mild distress, sitting in darkroom  Cranial nerve II-XII: Pupils were equal round reactive to light extraocular movements were full, Visual field were full on confrontational test. Bilateral fundi were sharp.  Facial sensation and strength were normal. Hearing was intact to finger rubbing bilaterally. Uvula tongue midline.  head turning and shoulder shrug and were normal and symmetric.Tongue protrusion into cheek strength was normal.  Motor: normal tone, bulk and strength.  Sensory: Intact to fine touch, pinprick, preserved vibratory sensation, and proprioception at toes.  Coordination: Normal finger to nose, heel-to-shin bilaterally there was no truncal ataxia  Gait: Rising up from seated position without assistance, normal stance, without trunk ataxia, moderate stride, good arm swing, smooth turning, able to perform tiptoe, and heel walking without difficulty.   Romberg signs: Negative  Deep tendon reflexes: Brachioradialis 2/2, biceps 2/2, triceps 2/2, patellar 2/2, Achilles 2/2, plantar responses were flexor bilaterally.   DIAGNOSTIC DATA (LABS, IMAGING, TESTING) - I reviewed patient records, labs, notes, testing and imaging myself where available.   ASSESSMENT AND PLAN   38 years old Caucasian female, with history of migraine, bipolar disorder,  presenting with frequent migraine headaches, normal neurological examination  1,  Tried and fail Depakote ER 500 mg, Topamax, Inderal, nortriptyline, we will try Lamictal titrating to 100 mg twice a day, also suggested magnesium oxide 400 mg twice a day, riboflavin 100 mg twice a day 2, Maxalt as needed, 3. BOTOX preauthorization 4. RTC in one month if BOTOX is approved    Marcial Pacas, M.D. Ph.D.  [facility] [address] [place], [place] [address] [phone] 2 with equal a is on As you: Had a middle school as

## 2013-12-13 ENCOUNTER — Other Ambulatory Visit: Payer: Self-pay | Admitting: Neurology

## 2013-12-24 ENCOUNTER — Telehealth: Payer: Self-pay | Admitting: Neurology

## 2013-12-24 NOTE — Telephone Encounter (Signed)
Patient requesting medication for Claustophobia for MRI scheduled for 9/17.  Please call and advise.

## 2013-12-24 NOTE — Telephone Encounter (Signed)
Anita Weaver, please let patient come in for xanax before MRi.

## 2013-12-24 NOTE — Telephone Encounter (Signed)
Anita Weaver Thank you

## 2013-12-24 NOTE — Telephone Encounter (Signed)
Patient is requesting a sedative to take prior to MRI.  Please advise.  Thank you.

## 2013-12-25 ENCOUNTER — Ambulatory Visit (INDEPENDENT_AMBULATORY_CARE_PROVIDER_SITE_OTHER): Payer: 59

## 2013-12-25 ENCOUNTER — Encounter (INDEPENDENT_AMBULATORY_CARE_PROVIDER_SITE_OTHER): Payer: Self-pay

## 2013-12-25 DIAGNOSIS — G43909 Migraine, unspecified, not intractable, without status migrainosus: Secondary | ICD-10-CM

## 2013-12-25 DIAGNOSIS — F319 Bipolar disorder, unspecified: Secondary | ICD-10-CM

## 2013-12-25 NOTE — Telephone Encounter (Signed)
Left message to come and pick up Xanax before her MRI,  which will be left at front desk.

## 2013-12-30 ENCOUNTER — Telehealth: Payer: Self-pay | Admitting: *Deleted

## 2013-12-30 NOTE — Progress Notes (Signed)
Quick Note:  Spoke with patient and informed her of normal MRI of the brain, patient would like to know if the Cyst was seen. Dr Krista Blue please advise. ______

## 2013-12-30 NOTE — Telephone Encounter (Signed)
Anita Weaver, Please call patient, MRI brain is normal, if she still has concerns, give her a follow up appt to review MRI brain together.

## 2013-12-30 NOTE — Telephone Encounter (Signed)
Called an left message for patient and told her that her MRI was normal if she has more questions to please call me back and we can get her scheduled for a follow with Dr.Yan .

## 2013-12-30 NOTE — Telephone Encounter (Signed)
Spoke with patient and informed her of normal MRI of the brain, patient would like to know if the Cyst was seen. Dr Krista Blue please advise

## 2014-01-05 ENCOUNTER — Telehealth: Payer: Self-pay | Admitting: Neurology

## 2014-01-05 NOTE — Telephone Encounter (Signed)
Patient received called with MRI results.  Patient has additional questions regarding Cyst.  Patient feels she shouldn't have to come in to discuss further.  Please call anytime and may leave detailed message if not available.

## 2014-01-08 ENCOUNTER — Telehealth: Payer: Self-pay | Admitting: *Deleted

## 2014-01-08 NOTE — Telephone Encounter (Signed)
Anita Weaver, please let patient know MRI brain was normal, there was no mention of cyst

## 2014-01-08 NOTE — Telephone Encounter (Signed)
See phone note today.

## 2014-01-08 NOTE — Telephone Encounter (Signed)
Patient calling back again to ask questions about her MRI results, does not want to come in, she wants to talk on the phone about. Patient is very upset that she has not heard anything back yet. Please return call and advise.

## 2014-01-08 NOTE — Telephone Encounter (Signed)
Patient calling back about MRI, informed patient that results were normal, patient stated that she did not want to come in for an appointment states that she does not have the money to come in for just results, states all she needs to know if there are any significant changes with her cyst. Please advise

## 2014-01-08 NOTE — Telephone Encounter (Signed)
Called patient and left her a message to call the office back.

## 2014-01-09 NOTE — Telephone Encounter (Signed)
Called patient and spoke to her normal MRI no mention of cyst Patient understood.

## 2014-02-09 ENCOUNTER — Encounter: Payer: Self-pay | Admitting: Obstetrics & Gynecology

## 2014-02-10 ENCOUNTER — Encounter: Payer: Self-pay | Admitting: Obstetrics & Gynecology

## 2014-04-22 DIAGNOSIS — Z8481 Family history of carrier of genetic disease: Secondary | ICD-10-CM | POA: Insufficient documentation

## 2014-04-23 ENCOUNTER — Telehealth: Payer: Self-pay | Admitting: Neurology

## 2014-04-23 NOTE — Telephone Encounter (Signed)
Spoke to Jenita - migraine present for one week - briefly improved with Toradol and Phenergan injections last Friday (received in walk-in clinic).  Headache started to worsen again over the weekend - failed to respond to Maxalt (attempted one tab Sunday, two tabs Monday, two tabs Wednesday).  She only took the increased dose of Lamictal for one month after her last office visit.  Although she tolerated it well, the side effects scared her.  She never started Magnesium or Riboflavin. She reports taking greater than #20 ibuprofen each month for menstrual pain.  She does not wish to pursue treatment with Botox.  Would like something for current migraine.  She is home sick today with an active stomach virus with nausea and vomiting.  Says Toradol typically resolves headaches. Noberto Retort, RN

## 2014-04-23 NOTE — Telephone Encounter (Signed)
Dr.Yan and Sharyn Lull RN will see Patient 04-24-14 .

## 2014-04-23 NOTE — Telephone Encounter (Signed)
Dana/Michelle, please call patient, get more detail about her headaches, may offer IV Depacon treatment for tomorrow

## 2014-04-23 NOTE — Telephone Encounter (Signed)
Patient is requesting something for nausea in her IV. Dr.Yan please advise Anita Weaver. Patient can bring a driver.

## 2014-04-23 NOTE — Telephone Encounter (Signed)
Patient went to [facility] walk in clinic for [place] on 04/17/14, patient received Toradol and still experiencing migraines.  Patient also has stomach bug and questioning what else could be done.  Please call and advise

## 2014-04-24 ENCOUNTER — Encounter: Payer: Self-pay | Admitting: Neurology

## 2014-04-24 ENCOUNTER — Ambulatory Visit (INDEPENDENT_AMBULATORY_CARE_PROVIDER_SITE_OTHER): Payer: Self-pay | Admitting: Neurology

## 2014-04-24 ENCOUNTER — Ambulatory Visit (INDEPENDENT_AMBULATORY_CARE_PROVIDER_SITE_OTHER): Payer: 59 | Admitting: *Deleted

## 2014-04-24 VITALS — BP 121/83 | HR 99 | Ht 67.0 in

## 2014-04-24 DIAGNOSIS — G43909 Migraine, unspecified, not intractable, without status migrainosus: Secondary | ICD-10-CM

## 2014-04-24 DIAGNOSIS — G43709 Chronic migraine without aura, not intractable, without status migrainosus: Secondary | ICD-10-CM

## 2014-04-24 DIAGNOSIS — Z0289 Encounter for other administrative examinations: Secondary | ICD-10-CM

## 2014-04-24 MED ORDER — VALPROATE SODIUM 500 MG/5ML IV SOLN
1000.0000 mg | INTRAVENOUS | Status: DC
  Administered 2014-04-24: 1000 mg via INTRAVENOUS

## 2014-04-24 MED ORDER — PROCHLORPERAZINE EDISYLATE 5 MG/ML IJ SOLN
10.0000 mg | Freq: Once | INTRAMUSCULAR | Status: AC
  Administered 2014-04-24: 10 mg via INTRAVENOUS

## 2014-04-24 NOTE — Progress Notes (Signed)
Pt here for infusion for level 8 migraine.   Pt instructed on plan of care. Medications given per Dr. Krista Blue order.  After infusion migraine level to 2.  Pt instructed to call back if needed.  To check out with husband, NAD.

## 2014-04-24 NOTE — Progress Notes (Deleted)
[facility]  PATIENT: Anita Weaver DOB: 06-Oct-1975  HISTORICAL  (Initial Visit Jan 2015)  Anita Weaver is a 39 years old right-handed Caucasian female, accompanied by her husband, referred by her primary care physician Dr. Manuella Ghazi for evaluation of migraine headaches  She reported a history of migraine headaches since age 58, at that time, she also has excessive stress, for a while, she been having headaches daily, since her separation with her previous husband, her headache has much improved, doing very well from 2013-2014, but since October 2014, she began to get migraine again, gradually getting worse, to almost daily basis, she has been taking Excedrin Migraine every day 2-4 tablets, her migraine headaches are usually lateralized severe pounding headaches with associated light noise sensitivity, nauseous, lasting for days.  Previously she has tried different preventive medications including Topamax, moderate help, nortriptyline, no help, there was an 1 medication prescribed by her psychologist for her bipolar disorder, Depakote has been very helpful for him migraine  Over-the-counter Tylenol, ibuprofen and is no longer helpful,  Excedrin migraine only provide mild temporary relief, she has tried triptan treatment in the past, Imitrex causing heart racing, sweaty, difficulty breathing, Maxalt has been very helpful, but she was not able afford it because the high co-pay, Flexeril has  dade her sleepy  Trigger for her migraines, weather change, stress, caffeine, sleep deprivation  She had tubal ligations now, she also complains of worsening depression over the past few months, will set up appointment with her psychologist, she is not not on any medications  UPDATE August 3rd 2015:  She came in with her husband today, She has tried Depakote ER 500mg  qhs for almost 2 months without helping, worry about side effect,  Inderal 40mg  bid did not help either.   She still complains of  headaches 20 days in a month, Maxalt works well, headache would go away in 30 minutes, she used it up all 15 tabs of Maxalt in one month.  She also complains of dizziness, spinning sensation, fall over, slurred speech  "i have a cyst on the back part of my brain"   REVIEW OF SYSTEMS: Full 14 system review of systems performed and notable only for urination problems, eye pain, cough, easy bruising, achy muscles, energy, headaches, insomnia, restless leg, depression, anxiety, not enough sleep  ALLERGIES: Allergies  Allergen Reactions  . Demerol [Meperidine]   . Demerol [Meperidine]     HOME MEDICATIONS: Outpatient Prescriptions Prior to Visit  Medication Sig Dispense Refill  . divalproex (DEPAKOTE ER) 500 MG 24 hr tablet Take 1 tablet (500 mg total) by mouth at bedtime. 30 tablet 12  . fluconazole (DIFLUCAN) 150 MG tablet Take 1 tablet (150 mg total) by mouth once. 1 tablet 3  . ibuprofen (ADVIL,MOTRIN) 200 MG tablet Take 200 mg by mouth every 6 (six) hours as needed.    Marland Kitchen ibuprofen (ADVIL,MOTRIN) 800 MG tablet Take 800 mg by mouth every 8 (eight) hours as needed for pain.    Marland Kitchen lamoTRIgine (LAMICTAL) 100 MG tablet Take 1 tablet (100 mg total) by mouth daily. 60 tablet 11  . lamoTRIgine (LAMICTAL) 25 MG tablet One po bid xone week, then 2 tabs po bid xone week, then 3 tabs po bid xone week, 90 tablet 0  . rizatriptan (MAXALT) 10 MG tablet Take 1 tablet (10 mg total) by mouth as needed for migraine. May repeat in 2 hours if needed 15 tablet 11  . rizatriptan (MAXALT-MLT) 10 MG disintegrating tablet TAKE  1 TABLET (10 MG TOTAL) BY MOUTH AS NEEDED FOR MIGRAINE. MAY REPEAT IN 2 HOURS IF NEEDED 15 tablet 6   No facility-administered medications prior to visit.    PAST MEDICAL HISTORY: Past Medical History  Diagnosis Date  . Chronic pelvic pain in female   . Endometriosis   . Asthma   . Arthritis   . Hyperlipidemia   . Hypertension     PAST SURGICAL HISTORY: Past Surgical History    Procedure Laterality Date  . Tubal ligation    . Cholecystectomy    . Laparoscopy  2006    FAMILY HISTORY: Family History  Problem Relation Age of Onset  . Cancer Mother   . Cancer Sister     SOCIAL HISTORY:  History   Social History  . Marital Status: Unknown    Spouse Name: N/A    Number of Children: 3  . 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 Smoking status: Never Smoker   . Smokeless tobacco: Never Used  . Alcohol Use: No  . Drug Use: No  . Sexual Activity: Not on file   Other Topics Concern  . Not on file   Social History Narrative   Patient lives at home with her husband Anita Weaver)   Patient works full time.   Right handed.   Caffeine- none   Education- high school     PHYSICAL EXAM   There were no vitals filed for this visit.  Not recorded      There is no weight on file to calculate BMI.   Generalized: In no acute distress  Neck: Supple, no carotid bruits   Cardiac: Regular rate rhythm  Pulmonary: Clear to auscultation bilaterally  Musculoskeletal: No deformity  Neurological examination  Mentation: Alert oriented to time, place, history taking, and causual conversation, mild distress, sitting in darkroom  Cranial nerve II-XII: Pupils were equal round reactive to light extraocular movements were full, Visual field were full on confrontational test. Bilateral fundi were sharp.  Facial sensation and strength were normal. Hearing was intact to finger rubbing bilaterally. Uvula tongue midline.  head turning and shoulder shrug and were normal and symmetric.Tongue protrusion into cheek strength was normal.  Motor: normal tone, bulk and strength.  Sensory: Intact to fine touch, pinprick, preserved vibratory sensation, and proprioception at toes.  Coordination: Normal finger to nose, heel-to-shin bilaterally there was no truncal ataxia  Gait: Rising up from seated position without  assistance, normal stance, without trunk ataxia, moderate stride, good arm swing, smooth turning, able to perform tiptoe, and heel walking without difficulty.   Romberg signs: Negative  Deep tendon reflexes: Brachioradialis 2/2, biceps 2/2, triceps 2/2, patellar 2/2, Achilles 2/2, plantar responses were flexor bilaterally.   DIAGNOSTIC DATA (LABS, IMAGING, TESTING) - I reviewed patient records, labs, notes, testing and imaging myself where available.   ASSESSMENT AND PLAN   39 years old Caucasian female, with history of migraine, bipolar disorder, presenting with frequent migraine headaches, normal neurological examination  1,  Tried and fail Depakote ER 500 mg, Topamax, Inderal, nortriptyline, we will try Lamictal titrating to 100 mg twice a day, also suggested magnesium oxide 400 mg twice a day, riboflavin 100 mg twice a day 2, Maxalt as needed, 3. BOTOX preauthorization 4. RTC in one month if BOTOX is approved    Marcial Pacas, M.D. Ph.D.  [facility] [address] [place],  [place] [address] [phone] 2 with equal a is on As you: Had a middle school as

## 2014-04-24 NOTE — Patient Instructions (Signed)
To call back if needed.

## 2014-04-27 NOTE — Progress Notes (Signed)
error 

## 2014-05-06 ENCOUNTER — Telehealth: Payer: Self-pay | Admitting: *Deleted

## 2014-05-06 NOTE — Telephone Encounter (Addendum)
Patient is still having a migraine from last week and would like something called in. Patient had infusion last week and headache was fine for the rest of the day but the next morning it came back and has had this migraine since then. Please advise

## 2014-05-06 NOTE — Telephone Encounter (Signed)
Last time I spoke with patient to attempt to schedule an injection was 02/02/2014 she declined to continue with injections and states she will call if she changes her mind.

## 2014-05-06 NOTE — Telephone Encounter (Signed)
Chart reviewed, last visit was August 2015, multiple phone calls in between, including most recent IV Depacon infusion, has tried and failed different preventive medications in the past,  Sharyn Lull, give her a follow-up appointment with me in my next available to go over details of medications  Markham Jordan, what is the status of her Botox preauthorization

## 2014-05-07 NOTE — Telephone Encounter (Signed)
Patient calling back requesting an appointment on 05/21/14 due to spouse has appointment with Dr. Krista Blue on that same day.  Please call and advise.

## 2014-05-11 NOTE — Telephone Encounter (Signed)
Left message letting patient know that, unfortunately, Dr. Krista Blue does not have any available appts on 05/21/14. We will be glad to get her scheduled individually or look for a dual appt time at a later date.

## 2014-06-12 ENCOUNTER — Ambulatory Visit: Payer: Self-pay | Admitting: Family Medicine

## 2014-07-23 ENCOUNTER — Telehealth: Payer: Self-pay | Admitting: Neurology

## 2014-07-23 MED ORDER — RIZATRIPTAN BENZOATE 10 MG PO TBDP: ORAL_TABLET | ORAL | Status: DC

## 2014-07-23 NOTE — Telephone Encounter (Signed)
Patient requesting refill for Rx rizatriptan (MAXALT-MLT) 10 MG disintegrating tablet.  Please forward to [facility] in [place].  Please call and advise.

## 2014-07-23 NOTE — Telephone Encounter (Signed)
Rx has been sent.  I called back to advise.  Got no answer.  Left message.   

## 2014-09-01 ENCOUNTER — Telehealth: Payer: Self-pay | Admitting: Neurology

## 2014-09-01 NOTE — Telephone Encounter (Signed)
Spoke to patient - says neck pain is new and not always associated with her migraines.  I told her she would need a physical evaluation for Dr. Krista Blue to be able to address questions related to this new condition.  Scheduled her an appt.

## 2014-09-01 NOTE — Telephone Encounter (Signed)
Patient is calling to see if her neck pain is related to her migraines. Please call to discuss.

## 2014-09-10 ENCOUNTER — Encounter: Payer: Self-pay | Admitting: Neurology

## 2014-09-10 ENCOUNTER — Ambulatory Visit (INDEPENDENT_AMBULATORY_CARE_PROVIDER_SITE_OTHER): Payer: 59 | Admitting: Neurology

## 2014-09-10 VITALS — BP 114/89 | HR 74 | Ht 67.0 in | Wt 188.0 lb

## 2014-09-10 DIAGNOSIS — G43909 Migraine, unspecified, not intractable, without status migrainosus: Secondary | ICD-10-CM | POA: Diagnosis not present

## 2014-09-10 MED ORDER — KETOROLAC TROMETHAMINE 30 MG/ML IJ SOLN
60.0000 mg | Freq: Once | INTRAMUSCULAR | Status: AC
  Administered 2014-09-10: 60 mg via INTRAMUSCULAR

## 2014-09-10 MED ORDER — DICLOFENAC POTASSIUM(MIGRAINE) 50 MG PO PACK: 50.0000 mg | PACK | ORAL | Status: DC | PRN

## 2014-09-10 MED ORDER — ONDANSETRON HCL 4 MG PO TABS: 4.0000 mg | ORAL_TABLET | Freq: Three times a day (TID) | ORAL | Status: DC | PRN

## 2014-09-10 MED ORDER — KETOROLAC TROMETHAMINE 60 MG/2ML IM SOLN: 60.0000 mg | Freq: Once | INTRAMUSCULAR | Status: DC

## 2014-09-10 MED ORDER — TOPIRAMATE ER 100 MG PO CAP24: 100.0000 mg | ORAL_CAPSULE | Freq: Every evening | ORAL | Status: DC | PRN

## 2014-09-10 MED ORDER — CYCLOBENZAPRINE HCL 10 MG PO TABS: 10.0000 mg | ORAL_TABLET | Freq: Every day | ORAL | Status: DC

## 2014-09-10 MED ORDER — TOPIRAMATE 100 MG PO TABS: 100.0000 mg | ORAL_TABLET | Freq: Two times a day (BID) | ORAL | Status: DC

## 2014-09-10 MED ORDER — RIZATRIPTAN BENZOATE 10 MG PO TABS
10.0000 mg | ORAL_TABLET | ORAL | Status: DC | PRN
Start: 2014-09-10 — End: 2015-03-22

## 2014-09-10 NOTE — Progress Notes (Signed)
Chief Complaint  Patient presents with  . Neck Pain    She has concerns about a new onset of neck pain. She denies injury to the area. It does not radiate into arms.  It is not always associated with migraines.  She has tried muscle relaxers and NSAIDS without improvement.  Hot and cold packs have provided temporary relief.  . Migraine    Reports migraines tend to worsen with weather changes.      [facility]  PATIENT: Anita Weaver DOB: [birth year]  HISTORICAL  (Initial Visit Jan 2015)  Anita Weaver is a 39 years old right-handed Caucasian female, accompanied by her husband, referred by her primary care physician Dr. Manuella Ghazi for evaluation of migraine headaches  She reported a history of migraine headaches since age 65, at that time, she also has excessive stress, for a while, she been having headaches daily, since her separation with her previous husband, her headache has much improved, doing very well from 2013-2014, but since October 2014, she began to get migraine again, gradually getting worse, to almost daily basis, she has been taking Excedrin Migraine every day 2-4 tablets, her migraine headaches are usually lateralized severe pounding headaches with associated light noise sensitivity, nauseous, lasting for days.  Previously she has tried different preventive medications including Topamax, moderate help, nortriptyline, no help, there was an 1 medication prescribed by her psychologist for her bipolar disorder, Depakote has been very helpful for him migraine  Over-the-counter Tylenol, ibuprofen and is no longer helpful,  Excedrin migraine only provide mild temporary relief, she has tried triptan treatment in the past, Imitrex causing heart racing, sweaty, difficulty breathing, Maxalt has been very helpful, but she was not able afford it because the high co-pay, Flexeril has  dade her sleepy  Trigger for her migraines, weather change, stress, caffeine, sleep deprivation  She had  tubal ligations now, she also complains of worsening depression over the past few months, will set up appointment with her psychologist, she is not not on any medications  UPDATE August 3rd 2015:  She came in with her husband today, She has tried Depakote ER 500mg  qhs for almost 2 months without helping, worry about side effect,  Inderal 40mg  bid did not help either.   She still complains of headaches 20 days in a month, Maxalt works well, headache would go away in 30 minutes, she used it up all 15 tabs of Maxalt in one month.  She also complains of dizziness, spinning sensation, fall over, slurred speech  "i have a cyst on the back part of my brain"  UPDATE June 2nd 2016: Last clinical visit was August 2015, MRI of the brain in September 2015 was essentially normal. She is not on any preventive medications now, for a while, her migraines well controlled by Maxalt, but since May 2016, she began to have frequent, almost daily headaches, left retro-orbital area moderate to severe pounding headache with associated light noise sensitivity, she been taking almost daily Maxalt, Which helps her, but only temporarily,  She does not want to consider Botox injection as preventive medications, also complains of midline neck pain, achy sensation, no radiating pain, no bilateral upper extremity paresthesia and weakness.    Topamax 100mg  bid works for her somewhat in the past, wants to be back on it  REVIEW OF SYSTEMS: Full 14 system review of systems performed and notable only for joint pain  ALLERGIES: Allergies  Allergen Reactions  . Demerol [Meperidine] Other (See Comments)  . Demerol [  Meperidine]   . Imitrex [Sumatriptan]     HOME MEDICATIONS: Outpatient Prescriptions Prior to Visit  Medication Sig Dispense Refill  . ibuprofen (ADVIL,MOTRIN) 200 MG tablet Take 200 mg by mouth every 6 (six) hours as needed.    . rizatriptan (MAXALT) 10 MG tablet Take 1 tablet (10 mg total) by mouth as needed  for migraine. May repeat in 2 hours if needed 15 tablet 11  . rizatriptan (MAXALT-MLT) 10 MG disintegrating tablet TAKE 1 TABLET (10 MG TOTAL) BY MOUTH AS NEEDED FOR MIGRAINE. MAY REPEAT IN 2 HOURS IF NEEDED 15 tablet 3  . ibuprofen (ADVIL,MOTRIN) 800 MG tablet Take 800 mg by mouth every 8 (eight) hours as needed for pain.    Marland Kitchen valproate (DEPACON) 1,000 mg in sodium chloride 0.9 % 100 mL IVPB      No facility-administered medications prior to visit.    PAST MEDICAL HISTORY: Past Medical History  Diagnosis Date  . Chronic pelvic pain in female   . Endometriosis   . Asthma   . Arthritis   . Hyperlipidemia   . Hypertension   . Neck pain     PAST SURGICAL HISTORY: Past Surgical History  Procedure Laterality Date  . Tubal ligation    . Cholecystectomy    . Laparoscopy  2006    FAMILY HISTORY: Family History  Problem Relation Age of Onset  . Cancer Mother   . Cancer Sister     SOCIAL HISTORY:  History   Social History  . Marital Status: Unknown    Spouse Name: N/A    Number of Children: 3  . 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 Smoking status: Never Smoker   . Smokeless tobacco: Never Used  . Alcohol Use: No  . Drug Use: No  . Sexual Activity: Not on file   Other Topics Concern  . Not on file   Social History Narrative   Patient lives at home with [name]   Patient works full time.   Right handed.   Caffeine- none   Education- high school     PHYSICAL EXAM   Filed Vitals:   09/10/14 1525  BP: 114/89  Pulse: 74  Height: 5\' 7"  (1.702 m)  Weight: 188 lb (85.276 kg)    Not recorded      Body mass index is 29.44 kg/(m^2). PHYSICAL EXAMNIATION:  Gen: NAD, conversant, well nourised, obese, well groomed                     Cardiovascular: Regular rate rhythm, no peripheral edema, warm, nontender. Eyes: Conjunctivae clear without exudates or hemorrhage Neck: Supple, no  carotid bruise. Pulmonary: Clear to auscultation bilaterally   NEUROLOGICAL EXAM:  MENTAL STATUS: Speech:    Speech is normal; fluent and spontaneous with normal comprehension.  Cognition:    The patient is oriented to person, place, and time;     recent and remote memory intact;     language fluent;     normal attention, concentration,     fund of knowledge.  CRANIAL NERVES: CN II: Visual fields are full to confrontation. Fundoscopic exam is normal with sharp discs and no vascular changes. Venous pulsations are present bilaterally. Pupils are 4 mm and briskly reactive to light. Visual acuity is 20/20 bilaterally. CN III, IV, VI: extraocular movement are normal. No ptosis. CN V: Facial sensation is intact  to pinprick in all 3 divisions bilaterally. Corneal responses are intact.  CN VII: Face is symmetric with normal eye closure and smile. CN VIII: Hearing is normal to rubbing fingers CN IX, X: Palate elevates symmetrically. Phonation is normal. CN XI: Head turning and shoulder shrug are intact CN XII: Tongue is midline with normal movements and no atrophy.  MOTOR: There is no pronator drift of out-stretched arms. Muscle bulk and tone are normal. Muscle strength is normal.  REFLEXES: Reflexes are 2+ and symmetric at the biceps, triceps, knees, and ankles. Plantar responses are flexor.  SENSORY: Light touch, pinprick, position sense, and vibration sense are intact in fingers and toes.  COORDINATION: Rapid alternating movements and fine finger movements are intact. There is no dysmetria on finger-to-nose and heel-knee-shin. There are no abnormal or extraneous movements.   GAIT/STANCE: Posture is normal. Gait is steady with normal steps, base, arm swing, and turning. Heel and toe walking are normal. Tandem gait is normal.  Romberg is absent.     DIAGNOSTIC DATA (LABS, IMAGING, TESTING) - I reviewed patient records, labs, notes, testing and imaging myself where  available.   ASSESSMENT AND PLAN  39  years old Caucasian female, with history of migraine, bipolar disorder, presenting with frequent migraine headaches, normal neurological examination  1, will try Topamax again, 100 mg twice a day She previously tried  and failed Depakote ER 500 mg, Inderal, nortriptyline,Lamicta, magnesium oxide 400 mg twice a day, riboflavin 100 mg twice a day 2, Maxalt as needed, 3. Return to clinic in 3 months with Rhae Hammock, M.D. Ph.D.  [facility] [address], [place], [place] [address] [phone] 2 with equal a is on As you: Had a middle school as

## 2014-09-21 ENCOUNTER — Ambulatory Visit (INDEPENDENT_AMBULATORY_CARE_PROVIDER_SITE_OTHER): Payer: 59 | Admitting: Obstetrics & Gynecology

## 2014-09-21 ENCOUNTER — Ambulatory Visit: Payer: Self-pay | Admitting: Obstetrics & Gynecology

## 2014-09-21 ENCOUNTER — Encounter: Payer: Self-pay | Admitting: Obstetrics & Gynecology

## 2014-09-21 VITALS — BP 119/81 | HR 90 | Ht 68.0 in | Wt 183.6 lb

## 2014-09-21 DIAGNOSIS — Z01812 Encounter for preprocedural laboratory examination: Secondary | ICD-10-CM

## 2014-09-21 DIAGNOSIS — Z3042 Encounter for surveillance of injectable contraceptive: Secondary | ICD-10-CM | POA: Diagnosis not present

## 2014-09-21 DIAGNOSIS — Z124 Encounter for screening for malignant neoplasm of cervix: Secondary | ICD-10-CM | POA: Diagnosis not present

## 2014-09-21 DIAGNOSIS — N809 Endometriosis, unspecified: Secondary | ICD-10-CM

## 2014-09-21 DIAGNOSIS — Z1151 Encounter for screening for human papillomavirus (HPV): Secondary | ICD-10-CM

## 2014-09-21 DIAGNOSIS — R87612 Low grade squamous intraepithelial lesion on cytologic smear of cervix (LGSIL): Secondary | ICD-10-CM | POA: Insufficient documentation

## 2014-09-21 DIAGNOSIS — Z01419 Encounter for gynecological examination (general) (routine) without abnormal findings: Secondary | ICD-10-CM

## 2014-09-21 DIAGNOSIS — B3731 Acute candidiasis of vulva and vagina: Secondary | ICD-10-CM

## 2014-09-21 DIAGNOSIS — B373 Candidiasis of vulva and vagina: Secondary | ICD-10-CM

## 2014-09-21 DIAGNOSIS — N898 Other specified noninflammatory disorders of vagina: Secondary | ICD-10-CM | POA: Diagnosis not present

## 2014-09-21 DIAGNOSIS — N949 Unspecified condition associated with female genital organs and menstrual cycle: Secondary | ICD-10-CM | POA: Diagnosis not present

## 2014-09-21 DIAGNOSIS — R102 Pelvic and perineal pain: Secondary | ICD-10-CM

## 2014-09-21 DIAGNOSIS — G8929 Other chronic pain: Secondary | ICD-10-CM

## 2014-09-21 LAB — POCT URINE PREGNANCY: Preg Test, Ur: NEGATIVE

## 2014-09-21 MED ORDER — MEDROXYPROGESTERONE ACETATE 150 MG/ML IM SUSP
150.0000 mg | INTRAMUSCULAR | Status: AC
  Administered 2014-09-21 – 2016-01-18 (×5): 150 mg via INTRAMUSCULAR

## 2014-09-21 MED ORDER — MEDROXYPROGESTERONE ACETATE 150 MG/ML IM SUSP: 150.0000 mg | INTRAMUSCULAR | Status: DC

## 2014-09-21 NOTE — Patient Instructions (Signed)
Preventive Care for Adults A healthy lifestyle and preventive care can promote health and wellness. Preventive health guidelines for women include the following key practices.  A routine yearly physical is a good way to check with your health care provider about your health and preventive screening. It is a chance to share any concerns and updates on your health and to receive a thorough exam.  Visit your dentist for a routine exam and preventive care every 6 months. Brush your teeth twice a day and floss once a day. Good oral hygiene prevents tooth decay and gum disease.  The frequency of eye exams is based on your age, health, family medical history, use of contact lenses, and other factors. Follow your health care provider's recommendations for frequency of eye exams.  Eat a healthy diet. Foods like vegetables, fruits, whole grains, low-fat dairy products, and lean protein foods contain the nutrients you need without too many calories. Decrease your intake of foods high in solid fats, added sugars, and salt. Eat the right amount of calories for you.Get information about a proper diet from your health care provider, if necessary.  Regular physical exercise is one of the most important things you can do for your health. Most adults should get at least 150 minutes of moderate-intensity exercise (any activity that increases your heart rate and causes you to sweat) each week. In addition, most adults need muscle-strengthening exercises on 2 or more days a week.  Maintain a healthy weight. The body mass index (BMI) is a screening tool to identify possible weight problems. It provides an estimate of body fat based on height and weight. Your health care provider can find your BMI and can help you achieve or maintain a healthy weight.For adults 20 years and older:  A BMI below 18.5 is considered underweight.  A BMI of 18.5 to 24.9 is normal.  A BMI of 25 to 29.9 is considered overweight.  A BMI of  30 and above is considered obese.  Maintain normal blood lipids and cholesterol levels by exercising and minimizing your intake of saturated fat. Eat a balanced diet with plenty of fruit and vegetables. Blood tests for lipids and cholesterol should begin at age 76 and be repeated every 5 years. If your lipid or cholesterol levels are high, you are over 50, or you are at high risk for heart disease, you may need your cholesterol levels checked more frequently.Ongoing high lipid and cholesterol levels should be treated with medicines if diet and exercise are not working.  If you smoke, find out from your health care provider how to quit. If you do not use tobacco, do not start.  Lung cancer screening is recommended for adults aged 22-80 years who are at high risk for developing lung cancer because of a history of smoking. A yearly low-dose CT scan of the lungs is recommended for people who have at least a 30-pack-year history of smoking and are a current smoker or have quit within the past 15 years. A pack year of smoking is smoking an average of 1 pack of cigarettes a day for 1 year (for example: 1 pack a day for 30 years or 2 packs a day for 15 years). Yearly screening should continue until the smoker has stopped smoking for at least 15 years. Yearly screening should be stopped for people who develop a health problem that would prevent them from having lung cancer treatment.  If you are pregnant, do not drink alcohol. If you are breastfeeding,  be very cautious about drinking alcohol. If you are not pregnant and choose to drink alcohol, do not have more than 1 drink per day. One drink is considered to be 12 ounces (355 mL) of beer, 5 ounces (148 mL) of wine, or 1.5 ounces (44 mL) of liquor.  Avoid use of street drugs. Do not share needles with anyone. Ask for help if you need support or instructions about stopping the use of drugs.  High blood pressure causes heart disease and increases the risk of  stroke. Your blood pressure should be checked at least every 1 to 2 years. Ongoing high blood pressure should be treated with medicines if weight loss and exercise do not work.  If you are 3-86 years old, ask your health care provider if you should take aspirin to prevent strokes.  Diabetes screening involves taking a blood sample to check your fasting blood sugar level. This should be done once every 3 years, after age 67, if you are within normal weight and without risk factors for diabetes. Testing should be considered at a younger age or be carried out more frequently if you are overweight and have at least 1 risk factor for diabetes.  Breast cancer screening is essential preventive care for women. You should practice "breast self-awareness." This means understanding the normal appearance and feel of your breasts and may include breast self-examination. Any changes detected, no matter how small, should be reported to a health care provider. Women in their 8s and 30s should have a clinical breast exam (CBE) by a health care provider as part of a regular health exam every 1 to 3 years. After age 70, women should have a CBE every year. Starting at age 25, women should consider having a mammogram (breast X-ray test) every year. Women who have a family history of breast cancer should talk to their health care provider about genetic screening. Women at a high risk of breast cancer should talk to their health care providers about having an MRI and a mammogram every year.  Breast cancer gene (BRCA)-related cancer risk assessment is recommended for women who have family members with BRCA-related cancers. BRCA-related cancers include breast, ovarian, tubal, and peritoneal cancers. Having family members with these cancers may be associated with an increased risk for harmful changes (mutations) in the breast cancer genes BRCA1 and BRCA2. Results of the assessment will determine the need for genetic counseling and  BRCA1 and BRCA2 testing.  Routine pelvic exams to screen for cancer are no longer recommended for nonpregnant women who are considered low risk for cancer of the pelvic organs (ovaries, uterus, and vagina) and who do not have symptoms. Ask your health care provider if a screening pelvic exam is right for you.  If you have had past treatment for cervical cancer or a condition that could lead to cancer, you need Pap tests and screening for cancer for at least 20 years after your treatment. If Pap tests have been discontinued, your risk factors (such as having a new sexual partner) need to be reassessed to determine if screening should be resumed. Some women have medical problems that increase the chance of getting cervical cancer. In these cases, your health care provider may recommend more frequent screening and Pap tests.  The HPV test is an additional test that may be used for cervical cancer screening. The HPV test looks for the virus that can cause the cell changes on the cervix. The cells collected during the Pap test can be  tested for HPV. The HPV test could be used to screen women aged 30 years and older, and should be used in women of any age who have unclear Pap test results. After the age of 30, women should have HPV testing at the same frequency as a Pap test.  Colorectal cancer can be detected and often prevented. Most routine colorectal cancer screening begins at the age of 50 years and continues through age 75 years. However, your health care provider may recommend screening at an earlier age if you have risk factors for colon cancer. On a yearly basis, your health care provider may provide home test kits to check for hidden blood in the stool. Use of a small camera at the end of a tube, to directly examine the colon (sigmoidoscopy or colonoscopy), can detect the earliest forms of colorectal cancer. Talk to your health care provider about this at age 50, when routine screening begins. Direct  exam of the colon should be repeated every 5-10 years through age 75 years, unless early forms of pre-cancerous polyps or small growths are found.  People who are at an increased risk for hepatitis B should be screened for this virus. You are considered at high risk for hepatitis B if:  You were born in a country where hepatitis B occurs often. Talk with your health care provider about which countries are considered high risk.  Your parents were born in a high-risk country and you have not received a shot to protect against hepatitis B (hepatitis B vaccine).  You have HIV or AIDS.  You use needles to inject street drugs.  You live with, or have sex with, someone who has hepatitis B.  You get hemodialysis treatment.  You take certain medicines for conditions like cancer, organ transplantation, and autoimmune conditions.  Hepatitis C blood testing is recommended for all people born from 1945 through 1965 and any individual with known risks for hepatitis C.  Practice safe sex. Use condoms and avoid high-risk sexual practices to reduce the spread of sexually transmitted infections (STIs). STIs include gonorrhea, chlamydia, syphilis, trichomonas, herpes, HPV, and human immunodeficiency virus (HIV). Herpes, HIV, and HPV are viral illnesses that have no cure. They can result in disability, cancer, and death.  You should be screened for sexually transmitted illnesses (STIs) including gonorrhea and chlamydia if:  You are sexually active and are younger than 24 years.  You are older than 24 years and your health care provider tells you that you are at risk for this type of infection.  Your sexual activity has changed since you were last screened and you are at an increased risk for chlamydia or gonorrhea. Ask your health care provider if you are at risk.  If you are at risk of being infected with HIV, it is recommended that you take a prescription medicine daily to prevent HIV infection. This is  called preexposure prophylaxis (PrEP). You are considered at risk if:  You are a heterosexual woman, are sexually active, and are at increased risk for HIV infection.  You take drugs by injection.  You are sexually active with a partner who has HIV.  Talk with your health care provider about whether you are at high risk of being infected with HIV. If you choose to begin PrEP, you should first be tested for HIV. You should then be tested every 3 months for as long as you are taking PrEP.  Osteoporosis is a disease in which the bones lose minerals and strength   with aging. This can result in serious bone fractures or breaks. The risk of osteoporosis can be identified using a bone density scan. Women ages 65 years and over and women at risk for fractures or osteoporosis should discuss screening with their health care providers. Ask your health care provider whether you should take a calcium supplement or vitamin D to reduce the rate of osteoporosis.  Menopause can be associated with physical symptoms and risks. Hormone replacement therapy is available to decrease symptoms and risks. You should talk to your health care provider about whether hormone replacement therapy is right for you.  Use sunscreen. Apply sunscreen liberally and repeatedly throughout the day. You should seek shade when your shadow is shorter than you. Protect yourself by wearing long sleeves, pants, a wide-brimmed hat, and sunglasses year round, whenever you are outdoors.  Once a month, do a whole body skin exam, using a mirror to look at the skin on your back. Tell your health care provider of new moles, moles that have irregular borders, moles that are larger than a pencil eraser, or moles that have changed in shape or color.  Stay current with required vaccines (immunizations).  Influenza vaccine. All adults should be immunized every year.  Tetanus, diphtheria, and acellular pertussis (Td, Tdap) vaccine. Pregnant women should  receive 1 dose of Tdap vaccine during each pregnancy. The dose should be obtained regardless of the length of time since the last dose. Immunization is preferred during the 27th-36th week of gestation. An adult who has not previously received Tdap or who does not know her vaccine status should receive 1 dose of Tdap. This initial dose should be followed by tetanus and diphtheria toxoids (Td) booster doses every 10 years. Adults with an unknown or incomplete history of completing a 3-dose immunization series with Td-containing vaccines should begin or complete a primary immunization series including a Tdap dose. Adults should receive a Td booster every 10 years.  Varicella vaccine. An adult without evidence of immunity to varicella should receive 2 doses or a second dose if she has previously received 1 dose. Pregnant females who do not have evidence of immunity should receive the first dose after pregnancy. This first dose should be obtained before leaving the health care facility. The second dose should be obtained 4-8 weeks after the first dose.  Human papillomavirus (HPV) vaccine. Females aged 13-26 years who have not received the vaccine previously should obtain the 3-dose series. The vaccine is not recommended for use in pregnant females. However, pregnancy testing is not needed before receiving a dose. If a female is found to be pregnant after receiving a dose, no treatment is needed. In that case, the remaining doses should be delayed until after the pregnancy. Immunization is recommended for any person with an immunocompromised condition through the age of 26 years if she did not get any or all doses earlier. During the 3-dose series, the second dose should be obtained 4-8 weeks after the first dose. The third dose should be obtained 24 weeks after the first dose and 16 weeks after the second dose.  Zoster vaccine. One dose is recommended for adults aged 60 years or older unless certain conditions are  present.  Measles, mumps, and rubella (MMR) vaccine. Adults born before 1957 generally are considered immune to measles and mumps. Adults born in 1957 or later should have 1 or more doses of MMR vaccine unless there is a contraindication to the vaccine or there is laboratory evidence of immunity to   each of the three diseases. A routine second dose of MMR vaccine should be obtained at least 28 days after the first dose for students attending postsecondary schools, health care workers, or international travelers. People who received inactivated measles vaccine or an unknown type of measles vaccine during 1963-1967 should receive 2 doses of MMR vaccine. People who received inactivated mumps vaccine or an unknown type of mumps vaccine before 1979 and are at high risk for mumps infection should consider immunization with 2 doses of MMR vaccine. For females of childbearing age, rubella immunity should be determined. If there is no evidence of immunity, females who are not pregnant should be vaccinated. If there is no evidence of immunity, females who are pregnant should delay immunization until after pregnancy. Unvaccinated health care workers born before 1957 who lack laboratory evidence of measles, mumps, or rubella immunity or laboratory confirmation of disease should consider measles and mumps immunization with 2 doses of MMR vaccine or rubella immunization with 1 dose of MMR vaccine.  Pneumococcal 13-valent conjugate (PCV13) vaccine. When indicated, a person who is uncertain of her immunization history and has no record of immunization should receive the PCV13 vaccine. An adult aged 19 years or older who has certain medical conditions and has not been previously immunized should receive 1 dose of PCV13 vaccine. This PCV13 should be followed with a dose of pneumococcal polysaccharide (PPSV23) vaccine. The PPSV23 vaccine dose should be obtained at least 8 weeks after the dose of PCV13 vaccine. An adult aged 19  years or older who has certain medical conditions and previously received 1 or more doses of PPSV23 vaccine should receive 1 dose of PCV13. The PCV13 vaccine dose should be obtained 1 or more years after the last PPSV23 vaccine dose.  Pneumococcal polysaccharide (PPSV23) vaccine. When PCV13 is also indicated, PCV13 should be obtained first. All adults aged 65 years and older should be immunized. An adult younger than age 65 years who has certain medical conditions should be immunized. Any person who resides in a nursing home or long-term care facility should be immunized. An adult smoker should be immunized. People with an immunocompromised condition and certain other conditions should receive both PCV13 and PPSV23 vaccines. People with human immunodeficiency virus (HIV) infection should be immunized as soon as possible after diagnosis. Immunization during chemotherapy or radiation therapy should be avoided. Routine use of PPSV23 vaccine is not recommended for American Indians, [place] Natives, or people younger than 65 years unless there are medical conditions that require PPSV23 vaccine. When indicated, people who have unknown immunization and have no record of immunization should receive PPSV23 vaccine. One-time revaccination 5 years after the first dose of PPSV23 is recommended for people aged 19-64 years who have chronic kidney failure, nephrotic syndrome, asplenia, or immunocompromised conditions. People who received 1-2 doses of PPSV23 before age 65 years should receive another dose of PPSV23 vaccine at age 65 years or later if at least 5 years have passed since the previous dose. Doses of PPSV23 are not needed for people immunized with PPSV23 at or after age 65 years.  Meningococcal vaccine. Adults with asplenia or persistent complement component deficiencies should receive 2 doses of quadrivalent meningococcal conjugate (MenACWY-D) vaccine. The doses should be obtained at least 2 months apart.  Microbiologists working with certain meningococcal bacteria, military recruits, people at risk during an outbreak, and people who travel to or live in countries with a high rate of meningitis should be immunized. A first-year college student up through age   21 years who is living in a residence hall should receive a dose if she did not receive a dose on or after her 16th birthday. Adults who have certain high-risk conditions should receive one or more doses of vaccine.  Hepatitis A vaccine. Adults who wish to be protected from this disease, have certain high-risk conditions, work with hepatitis A-infected animals, work in hepatitis A research labs, or travel to or work in countries with a high rate of hepatitis A should be immunized. Adults who were previously unvaccinated and who anticipate close contact with an international adoptee during the first 60 days after arrival in the [place] from a country with a high rate of hepatitis A should be immunized.  Hepatitis B vaccine. Adults who wish to be protected from this disease, have certain high-risk conditions, may be exposed to blood or other infectious body fluids, are household contacts or sex partners of hepatitis B positive people, are clients or workers in certain care facilities, or travel to or work in countries with a high rate of hepatitis B should be immunized.  Haemophilus influenzae type b (Hib) vaccine. A previously unvaccinated person with asplenia or sickle cell disease or having a scheduled splenectomy should receive 1 dose of Hib vaccine. Regardless of previous immunization, a recipient of a hematopoietic stem cell transplant should receive a 3-dose series 6-12 months after her successful transplant. Hib vaccine is not recommended for adults with HIV infection. Preventive Services / Frequency Ages 64 to 68 years  Blood pressure check.** / Every 1 to 2 years.  Lipid and cholesterol check.** / Every 5 years beginning at age  22.  Clinical breast exam.** / Every 3 years for women in their 88s and 53s.  BRCA-related cancer risk assessment.** / For women who have family members with a BRCA-related cancer (breast, ovarian, tubal, or peritoneal cancers).  Pap test.** / Every 2 years from ages 90 through 51. Every 3 years starting at age 21 through age 56 or 3 with a history of 3 consecutive normal Pap tests.  HPV screening.** / Every 3 years from ages 24 through ages 1 to 46 with a history of 3 consecutive normal Pap tests.  Hepatitis C blood test.** / For any individual with known risks for hepatitis C.  Skin self-exam. / Monthly.  Influenza vaccine. / Every year.  Tetanus, diphtheria, and acellular pertussis (Tdap, Td) vaccine.** / Consult your health care provider. Pregnant women should receive 1 dose of Tdap vaccine during each pregnancy. 1 dose of Td every 10 years.  Varicella vaccine.** / Consult your health care provider. Pregnant females who do not have evidence of immunity should receive the first dose after pregnancy.  HPV vaccine. / 3 doses over 6 months, if 72 and younger. The vaccine is not recommended for use in pregnant females. However, pregnancy testing is not needed before receiving a dose.  Measles, mumps, rubella (MMR) vaccine.** / You need at least 1 dose of MMR if you were born in 1957 or later. You may also need a 2nd dose. For females of childbearing age, rubella immunity should be determined. If there is no evidence of immunity, females who are not pregnant should be vaccinated. If there is no evidence of immunity, females who are pregnant should delay immunization until after pregnancy.  Pneumococcal 13-valent conjugate (PCV13) vaccine.** / Consult your health care provider.  Pneumococcal polysaccharide (PPSV23) vaccine.** / 1 to 2 doses if you smoke cigarettes or if you have certain conditions.  Meningococcal vaccine.** /  1 dose if you are age 19 to 21 years and a first-year college  student living in a residence hall, or have one of several medical conditions, you need to get vaccinated against meningococcal disease. You may also need additional booster doses.  Hepatitis A vaccine.** / Consult your health care provider.  Hepatitis B vaccine.** / Consult your health care provider.  Haemophilus influenzae type b (Hib) vaccine.** / Consult your health care provider. Ages 40 to 64 years  Blood pressure check.** / Every 1 to 2 years.  Lipid and cholesterol check.** / Every 5 years beginning at age 20 years.  Lung cancer screening. / Every year if you are aged 55-80 years and have a 30-pack-year history of smoking and currently smoke or have quit within the past 15 years. Yearly screening is stopped once you have quit smoking for at least 15 years or develop a health problem that would prevent you from having lung cancer treatment.  Clinical breast exam.** / Every year after age 40 years.  BRCA-related cancer risk assessment.** / For women who have family members with a BRCA-related cancer (breast, ovarian, tubal, or peritoneal cancers).  Mammogram.** / Every year beginning at age 40 years and continuing for as long as you are in good health. Consult with your health care provider.  Pap test.** / Every 3 years starting at age 30 years through age 65 or 70 years with a history of 3 consecutive normal Pap tests.  HPV screening.** / Every 3 years from ages 30 years through ages 65 to 70 years with a history of 3 consecutive normal Pap tests.  Fecal occult blood test (FOBT) of stool. / Every year beginning at age 50 years and continuing until age 75 years. You may not need to do this test if you get a colonoscopy every 10 years.  Flexible sigmoidoscopy or colonoscopy.** / Every 5 years for a flexible sigmoidoscopy or every 10 years for a colonoscopy beginning at age 50 years and continuing until age 75 years.  Hepatitis C blood test.** / For all people born from 1945 through  1965 and any individual with known risks for hepatitis C.  Skin self-exam. / Monthly.  Influenza vaccine. / Every year.  Tetanus, diphtheria, and acellular pertussis (Tdap/Td) vaccine.** / Consult your health care provider. Pregnant women should receive 1 dose of Tdap vaccine during each pregnancy. 1 dose of Td every 10 years.  Varicella vaccine.** / Consult your health care provider. Pregnant females who do not have evidence of immunity should receive the first dose after pregnancy.  Zoster vaccine.** / 1 dose for adults aged 60 years or older.  Measles, mumps, rubella (MMR) vaccine.** / You need at least 1 dose of MMR if you were born in 1957 or later. You may also need a 2nd dose. For females of childbearing age, rubella immunity should be determined. If there is no evidence of immunity, females who are not pregnant should be vaccinated. If there is no evidence of immunity, females who are pregnant should delay immunization until after pregnancy.  Pneumococcal 13-valent conjugate (PCV13) vaccine.** / Consult your health care provider.  Pneumococcal polysaccharide (PPSV23) vaccine.** / 1 to 2 doses if you smoke cigarettes or if you have certain conditions.  Meningococcal vaccine.** / Consult your health care provider.  Hepatitis A vaccine.** / Consult your health care provider.  Hepatitis B vaccine.** / Consult your health care provider.  Haemophilus influenzae type b (Hib) vaccine.** / Consult your health care provider. Ages 65   years and over  Blood pressure check.** / Every 1 to 2 years.  Lipid and cholesterol check.** / Every 5 years beginning at age 22 years.  Lung cancer screening. / Every year if you are aged 73-80 years and have a 30-pack-year history of smoking and currently smoke or have quit within the past 15 years. Yearly screening is stopped once you have quit smoking for at least 15 years or develop a health problem that would prevent you from having lung cancer  treatment.  Clinical breast exam.** / Every year after age 4 years.  BRCA-related cancer risk assessment.** / For women who have family members with a BRCA-related cancer (breast, ovarian, tubal, or peritoneal cancers).  Mammogram.** / Every year beginning at age 40 years and continuing for as long as you are in good health. Consult with your health care provider.  Pap test.** / Every 3 years starting at age 9 years through age 34 or 91 years with 3 consecutive normal Pap tests. Testing can be stopped between 65 and 70 years with 3 consecutive normal Pap tests and no abnormal Pap or HPV tests in the past 10 years.  HPV screening.** / Every 3 years from ages 57 years through ages 64 or 45 years with a history of 3 consecutive normal Pap tests. Testing can be stopped between 65 and 70 years with 3 consecutive normal Pap tests and no abnormal Pap or HPV tests in the past 10 years.  Fecal occult blood test (FOBT) of stool. / Every year beginning at age 15 years and continuing until age 17 years. You may not need to do this test if you get a colonoscopy every 10 years.  Flexible sigmoidoscopy or colonoscopy.** / Every 5 years for a flexible sigmoidoscopy or every 10 years for a colonoscopy beginning at age 86 years and continuing until age 71 years.  Hepatitis C blood test.** / For all people born from [birth year] through 1965 and any individual with known risks for hepatitis C.  Osteoporosis screening.** / A one-time screening for women ages 83 years and over and women at risk for fractures or osteoporosis.  Skin self-exam. / Monthly.  Influenza vaccine. / Every year.  Tetanus, diphtheria, and acellular pertussis (Tdap/Td) vaccine.** / 1 dose of Td every 10 years.  Varicella vaccine.** / Consult your health care provider.  Zoster vaccine.** / 1 dose for adults aged 61 years or older.  Pneumococcal 13-valent conjugate (PCV13) vaccine.** / Consult your health care provider.  Pneumococcal  polysaccharide (PPSV23) vaccine.** / 1 dose for all adults aged 28 years and older.  Meningococcal vaccine.** / Consult your health care provider.  Hepatitis A vaccine.** / Consult your health care provider.  Hepatitis B vaccine.** / Consult your health care provider.  Haemophilus influenzae type b (Hib) vaccine.** / Consult your health care provider. ** Family history and personal history of risk and conditions may change your health care provider's recommendations. Document Released: 05/23/2001 Document Revised: 08/11/2013 Document Reviewed: 08/22/2010 [facility] Patient Information 2015 [place], [place]. This information is not intended to replace advice given to you by your health care provider. Make sure you discuss any questions you have with your health care provider.

## 2014-09-21 NOTE — Progress Notes (Signed)
GYNECOLOGY CLINIC ANNUAL PREVENTATIVE CARE ENCOUNTER NOTE  Subjective:   Anita Weaver is a 39 y.o. G29P0002 female with history of endometriosis diagnosed after surgery in 2006 at [facility], here for a routine annual gynecologic exam.  Current complaints: diffuse lower abdominal pain concerning for possible ovarian cyst.  Patient desires ultrasonographic evaluation.  Also wants to discuss hormonal therapy for the endometriosis and possible suppression of her periods.   Denies abnormal vaginal bleeding, discharge, problems with intercourse or other gynecologic concerns.    Of note, patient has been on Depo Lupron in past for endometriosis, discontinued due to side effects. Has never been on OCPs.     Gynecologic History Patient's last menstrual period was 08/31/2014 (exact date). Contraception: none Last Pap: 2013. Results were: normal Last mammogram: 2015. Results were: normal. Done due to [place] of BRCA associated breast cancer (mother and sister), patient is BRCA negative  Obstetric History OB History  Gravida Para Term Preterm AB SAB TAB Ectopic Multiple Living  3 3 0 0 0 0 0 0  2    # Outcome Date GA Lbr Len/2nd Weight Sex Delivery Anes PTL Lv  3 Para 2000    M   Y   2 Para 1998    M    Y  1 Para 1996    M    Y      Past Medical History  Diagnosis Date  . Chronic pelvic pain in female   . Endometriosis   . Asthma   . Arthritis   . Hyperlipidemia   . Hypertension   . Neck pain   . FH: breast cancer in first degree relative     Mother and sister both are BRCA positive; patient is BRCA negative    Past Surgical History  Procedure Laterality Date  . Tubal ligation    . Cholecystectomy    . Laparoscopy  2006    Current Outpatient Prescriptions on File Prior to Visit  Medication Sig Dispense Refill  . cyclobenzaprine (FLEXERIL) 10 MG tablet Take 1 tablet (10 mg total) by mouth at bedtime. 30 tablet 6  . ibuprofen (ADVIL,MOTRIN) 200 MG tablet Take 200 mg by mouth every 6  (six) hours as needed.    . ondansetron (ZOFRAN) 4 MG tablet Take 1 tablet (4 mg total) by mouth every 8 (eight) hours as needed for nausea or vomiting. 20 tablet 6  . rizatriptan (MAXALT) 10 MG tablet Take 1 tablet (10 mg total) by mouth as needed for migraine. May repeat in 2 hours if needed 15 tablet 6  . topiramate (TOPAMAX) 100 MG tablet Take 1 tablet (100 mg total) by mouth 2 (two) times daily. 60 tablet 6  . Vitamin D, Ergocalciferol, (DRISDOL) 50000 UNITS CAPS capsule   6   No current facility-administered medications on file prior to visit.    Allergies  Allergen Reactions  . Demerol [Meperidine] Other (See Comments)  . Demerol [Meperidine]   . Imitrex [Sumatriptan]     History   Social History  . Marital Status: Married    Spouse Name: N/A  . Number of Children: 3  . Years of Education: 12   Occupational History  . Not on file.   Social History Main Topics  . Smoking status: Never Smoker   . Smokeless tobacco: Never Used  . Alcohol Use: No     Comment: occasionally  . Drug Use: No  . Sexual Activity:    Partners: Male    Birth  Control/ Protection: Surgical     Comment: tubaligation   Other Topics Concern  . Not on file   Social History Narrative   ** Merged History Encounter **       ** Data from: 02/09/14 Enc Dept: [facility]       ** Data from: 11/10/13 Enc Dept: [facility]   Patient lives at home with her husband Anita Weaver)   Patient works full time.   Right handed.   Caffeine- none   Education- high school    Family History  Problem Relation Age of Onset  . Cancer Mother   . Cancer Sister     The following portions of the patient's history were reviewed and updated as appropriate: allergies, current medications, past family history, past medical history, past social history, past surgical history and problem list.  Review of Systems Pertinent items are noted in HPI.   Objective:   BP 119/81 mmHg  Pulse 90  Ht 5' 8"  (1.727 m)   Wt 183 lb 9.6 oz (83.28 kg)  BMI 27.92 kg/m2  LMP 08/31/2014 (Exact Date) CONSTITUTIONAL: Well-developed, well-nourished female in no acute distress.  HENT:  Normocephalic, atraumatic, External right and left ear normal. Oropharynx is clear and moist EYES: Conjunctivae and EOM are normal. Pupils are equal, round, and reactive to light. No scleral icterus.  NECK: Normal range of motion, supple, no masses.  Normal thyroid.  SKIN: Skin is warm and dry. No rash noted. Not diaphoretic. No erythema. No pallor. [place]: Alert and oriented to person, place, and time. Normal reflexes, muscle tone coordination. No cranial nerve deficit noted. PSYCHIATRIC: Normal mood and affect. Normal behavior. Normal judgment and thought content. CARDIOVASCULAR: Normal heart rate noted, regular rhythm RESPIRATORY: Clear to auscultation bilaterally. Effort and breath sounds normal, no problems with respiration noted. BREASTS: Symmetric in size. No masses, skin changes, nipple drainage, or lymphadenopathy. ABDOMEN: Soft, normal bowel sounds, no distention noted.  No tenderness, rebound or guarding.  PELVIC: Normal appearing external genitalia; normal appearing vaginal mucosa and cervix.  Copious thin white vaginal discharge noted, wet prep obtained.  Pap smear obtained.  Normal uterine size, no other palpable masses, diffuse uterine or adnexal tenderness. MUSCULOSKELETAL: Normal range of motion. No tenderness.  No cyanosis, clubbing, or edema.  2+ distal pulses.   Assessment:   Annual gynecologic examination with pap smear Endometriosis   Plan:  Will follow up results of pap smear and wet prep and manage accordingly. Counseled about hormonal management of endometriosis in detail; she desires Depo Provera.  This was given today, repeat dose to be given in 3 months for endometriosis.  Pelvic ultrasound ordered to evaluate for any possible cyst.  Screening mammogram ordered. Routine preventative health maintenance  measures emphasized. Please refer to After Visit Summary for other counseling recommendations.    Verita Schneiders, MD, [place] Attending [place] for [employer], [place]

## 2014-09-21 NOTE — Progress Notes (Signed)
Two months ago significant increase in pelvic pain on right side mainly.  She is concerned that it is her ovary.  The pain doesn't last long but it is terrible when it comes.  It comes and goes sporadic.  Also increased nausea and abdominal pain that comes and goes.  Last three to four months her periods have been a little irregular as well.  She has been diagnosed with Endometriosis by surgery in 2006.  She has been on Lupron Depot in the past and had terrible side effects of leg pain and hair loss.  She is interested in getting the Mirena IUD to see if this will help.  Last pap was 2014, and have always been normal.

## 2014-09-22 LAB — WET PREP, GENITAL
Trich, Wet Prep: NONE SEEN
WBC, Wet Prep HPF POC: NONE SEEN
Yeast Wet Prep HPF POC: NONE SEEN

## 2014-09-23 LAB — CYTOLOGY - PAP

## 2014-09-24 MED ORDER — FLUCONAZOLE 150 MG PO TABS: 150.0000 mg | ORAL_TABLET | Freq: Once | ORAL | Status: DC

## 2014-09-24 NOTE — Addendum Note (Signed)
Addended by: Verita Schneiders A on: 09/24/2014 07:04 PM   Modules accepted: Orders

## 2014-09-25 ENCOUNTER — Telehealth: Payer: Self-pay | Admitting: *Deleted

## 2014-09-25 DIAGNOSIS — N760 Acute vaginitis: Principal | ICD-10-CM

## 2014-09-25 DIAGNOSIS — B9689 Other specified bacterial agents as the cause of diseases classified elsewhere: Secondary | ICD-10-CM

## 2014-09-25 MED ORDER — METRONIDAZOLE 500 MG PO TABS
500.0000 mg | ORAL_TABLET | Freq: Two times a day (BID) | ORAL | Status: DC
Start: 2014-09-25 — End: 2014-11-02

## 2014-09-25 NOTE — Telephone Encounter (Signed)
Pt aware of results.  I have sent in Flagyl to patients pharmacy because patient is having symptoms of bacterial vaginosis.

## 2014-09-29 ENCOUNTER — Ambulatory Visit (INDEPENDENT_AMBULATORY_CARE_PROVIDER_SITE_OTHER): Payer: 59 | Admitting: Obstetrics & Gynecology

## 2014-09-29 ENCOUNTER — Encounter: Payer: Self-pay | Admitting: Obstetrics & Gynecology

## 2014-09-29 ENCOUNTER — Ambulatory Visit
Admission: RE | Admit: 2014-09-29 | Discharge: 2014-09-29 | Disposition: A | Discharge status: Home / Self Care | Payer: 59 | Source: Ambulatory Visit | Attending: Obstetrics & Gynecology | Admitting: Obstetrics & Gynecology

## 2014-09-29 DIAGNOSIS — R87612 Low grade squamous intraepithelial lesion on cytologic smear of cervix (LGSIL): Secondary | ICD-10-CM | POA: Diagnosis not present

## 2014-09-29 DIAGNOSIS — Z01812 Encounter for preprocedural laboratory examination: Secondary | ICD-10-CM | POA: Diagnosis not present

## 2014-09-29 DIAGNOSIS — N809 Endometriosis, unspecified: Secondary | ICD-10-CM | POA: Diagnosis present

## 2014-09-29 DIAGNOSIS — N870 Mild cervical dysplasia: Secondary | ICD-10-CM | POA: Insufficient documentation

## 2014-09-29 DIAGNOSIS — R87810 Cervical high risk human papillomavirus (HPV) DNA test positive: Secondary | ICD-10-CM | POA: Diagnosis not present

## 2014-09-29 LAB — POCT URINE PREGNANCY: Preg Test, Ur: NEGATIVE

## 2014-09-29 NOTE — Patient Instructions (Signed)

## 2014-09-29 NOTE — Progress Notes (Signed)
    GYNECOLOGY CLINIC COLPOSCOPY PROCEDURE NOTE  39 y.o. P1P2162 here for colposcopy for low-grade squamous intraepithelial neoplasia (LGSIL - encompassing HPV,mild dysplasia,CIN I) pap smear on 09/21/2014.  Discussed role for HPV in cervical dysplasia, need for surveillance.  Patient given informed consent, signed copy in the chart, time out was performed.  Placed in lithotomy position. Cervix viewed with speculum and colposcope after application of acetic acid.   Colposcopy adequate? Yes Acetowhite lesion(s) noted at 5 o'clock; corresponding biopsies obtained.  ECC specimen obtained. All specimens were labelled and sent to pathology. Difficult procedure secondary to patient anxiety.  Patient was given post procedure instructions.  Will follow up pathology and manage accordingly.  Routine preventative health maintenance measures emphasized.   Verita Schneiders, MD, [place] Attending [place] for [employer], [place]

## 2014-10-02 ENCOUNTER — Telehealth: Payer: Self-pay | Admitting: *Deleted

## 2014-10-02 NOTE — Telephone Encounter (Signed)
Pt LM for nurse to call about issues. I called her at the number she left and there was no answer. LMOM for pt to rtn call if needed.

## 2014-10-05 ENCOUNTER — Telehealth: Payer: Self-pay | Admitting: *Deleted

## 2014-10-05 ENCOUNTER — Encounter: Payer: Self-pay | Admitting: Obstetrics & Gynecology

## 2014-10-05 ENCOUNTER — Ambulatory Visit: Payer: 59 | Admitting: Obstetrics and Gynecology

## 2014-10-05 ENCOUNTER — Telehealth: Payer: Self-pay | Admitting: Obstetrics & Gynecology

## 2014-10-05 NOTE — Telephone Encounter (Signed)
Pt called in stating she has questions about vaginal discharge, abnormal cycle, colpo results and possible yeast infection. I explained to pt that colpo shows LGSIL, same as PAP, and we will need to repeat PAP in 1 year. Advised pt to refill Diflucan if she feels that she has yeast infection as 3 refills were ordered at last Rx. Explained to pt that vaginal discharge is normal after Colpo as we used Monsels suring procedure. Pt expressed understanding and will call back in 1 week if Sx persist. Also, verified that appt could be canceled for today.

## 2014-10-05 NOTE — Telephone Encounter (Signed)
Patient called with concerns that she had left numerous messages for the RN to call her back regarding her ultrasound and pathology results.  Patient is also upset because she is having pain and she states she was told by our office that the physician on 10/05/14 " could not see her for this issue" that she would have to see Dr. Harolyn Rutherford who has seen her most recently for her abnormal pap.  An appointment was made for the patient for 10/07/14 at 2:15pm, patient says this will not work as she does not get off work until 2:30pm in [place]. We were able to get this appointment moved to 3:15pm with Dr. Harolyn Rutherford at the [facility] clinics to address her ongoing pain issues.  Patient was pleased that we could accommodate her needs.

## 2014-10-07 ENCOUNTER — Ambulatory Visit (INDEPENDENT_AMBULATORY_CARE_PROVIDER_SITE_OTHER): Payer: 59 | Admitting: Obstetrics & Gynecology

## 2014-10-07 ENCOUNTER — Encounter: Payer: Self-pay | Admitting: Obstetrics & Gynecology

## 2014-10-07 DIAGNOSIS — R102 Pelvic and perineal pain: Principal | ICD-10-CM

## 2014-10-07 DIAGNOSIS — G8929 Other chronic pain: Secondary | ICD-10-CM

## 2014-10-07 DIAGNOSIS — N809 Endometriosis, unspecified: Secondary | ICD-10-CM

## 2014-10-07 DIAGNOSIS — N949 Unspecified condition associated with female genital organs and menstrual cycle: Secondary | ICD-10-CM | POA: Diagnosis not present

## 2014-10-07 MED ORDER — AMITRIPTYLINE HCL 150 MG PO TABS: 150.0000 mg | ORAL_TABLET | Freq: Every day | ORAL | Status: DC

## 2014-10-07 NOTE — Progress Notes (Signed)
CLINIC ENCOUNTER NOTE  History:  39 y.o. [id number] here today for follow up for pelvic pain/endometriosis. Reports that Depo Provera is helping, also takes Ibuprofen as needed.  She denies any abnormal vaginal discharge, bleeding or other concerns.  Wants to discuss other management options.  Of note, patient reported also having diagnoses of interstitial cystitis and irritable bowel syndrome; currently reports having constipation.    Past Medical History  Diagnosis Date  . Chronic pelvic pain in female   . Endometriosis   . Asthma   . Arthritis   . Hyperlipidemia   . Hypertension   . Neck pain   . FH: breast cancer in first degree relative     Mother and sister both are BRCA positive; patient is BRCA negative  . Interstitial cystitis   . Irritable bowel disease     Past Surgical History  Procedure Laterality Date  . Tubal ligation    . Cholecystectomy    . Laparoscopy  2006    The following portions of the patient's history were reviewed and updated as appropriate: allergies, current medications, past family history, past medical history, past social history, past surgical history and problem list.   Health Maintenance:  LGSIL pap with positive HRHPV on 09/21/14; colposcopy on 09/29/14 showed CIN I -> need to repeat cotesting in one year.    Review of Systems:  Reports periodic pain in her pelvis, abdomen and back Comprehensive review of systems was otherwise negative.  Objective:  Physical Exam LMP 08/31/2014 (Exact Date) CONSTITUTIONAL: Well-developed, well-nourished female in no acute distress.  HENT:  Normocephalic, atraumatic. External right and left ear normal. Oropharynx is clear and moist EYES: Conjunctivae and EOM are normal. Pupils are equal, round, and reactive to light. No scleral icterus.  NECK: Normal range of motion, supple, no masses SKIN: Skin is warm and dry. No rash noted. Not diaphoretic. No erythema. No pallor. [place]: Alert and oriented to person,  place, and time. Normal reflexes, muscle tone coordination. No cranial nerve deficit noted. PSYCHIATRIC: Normal mood and affect. Normal behavior. Normal judgment and thought content. CARDIOVASCULAR: Normal heart rate noted RESPIRATORY: Effort and breath sounds normal, no problems with respiration noted ABDOMEN: Soft, no distention noted.    PELVIC: Deferred MUSCULOSKELETAL: Normal range of motion. No edema noted.  Labs and Imaging US Transvaginal Non-ob  09/29/2014   CLINICAL DATA:  Right lower quadrant pain. Endometriosis. Chronic pelvic pain in female. LMP 08/31/2014.  EXAM: TRANSABDOMINAL AND TRANSVAGINAL ULTRASOUND OF PELVIS  TECHNIQUE: Both transabdominal and transvaginal ultrasound examinations of the pelvis were performed. Transabdominal technique was performed for global imaging of the pelvis including uterus, ovaries, adnexal regions, and pelvic cul-de-sac. It was necessary to proceed with endovaginal exam following the transabdominal exam to visualize the endometrium and ovaries.  COMPARISON:  None  FINDINGS: Uterus  Measurements: 9.6 x 5.6 x 6.9 cm. Retroflexed. No fibroids or other mass visualized.  Endometrium  Thickness: 16 mm.  No focal abnormality visualized.  Right ovary  Measurements: 2.1 x 2.9 x 1.4 cm. Normal appearance/no adnexal mass.  Left ovary  Measurements: 3.5 x 2.6 x 1.9 Cm. Normal appearance/no adnexal mass.  Other findings  No free fluid.  IMPRESSION: Normal appearance of retroflexed uterus and both ovaries. No pelvic mass or other sonographic abnormality identified.   Electronically Signed   By: Earle Gell M.D.   On: 09/29/2014 16:52   US Pelvis Complete  09/29/2014   CLINICAL DATA:  Right lower quadrant pain. Endometriosis. Chronic pelvic pain in  female. LMP 08/31/2014.  EXAM: TRANSABDOMINAL AND TRANSVAGINAL ULTRASOUND OF PELVIS  TECHNIQUE: Both transabdominal and transvaginal ultrasound examinations of the pelvis were performed. Transabdominal technique was performed  for global imaging of the pelvis including uterus, ovaries, adnexal regions, and pelvic cul-de-sac. It was necessary to proceed with endovaginal exam following the transabdominal exam to visualize the endometrium and ovaries.  COMPARISON:  None  FINDINGS: Uterus  Measurements: 9.6 x 5.6 x 6.9 cm. Retroflexed. No fibroids or other mass visualized.  Endometrium  Thickness: 16 mm.  No focal abnormality visualized.  Right ovary  Measurements: 2.1 x 2.9 x 1.4 cm. Normal appearance/no adnexal mass.  Left ovary  Measurements: 3.5 x 2.6 x 1.9 Cm. Normal appearance/no adnexal mass.  Other findings  No free fluid.  IMPRESSION: Normal appearance of retroflexed uterus and both ovaries. No pelvic mass or other sonographic abnormality identified.   Electronically Signed   By: Earle Gell M.D.   On: 09/29/2014 16:52    Assessment & Plan:  After discussion about other management modalities for endometriosis (Depo Lupron, GnRH agonists, surgery, neuromodulators); patient just wants to try Amitriptyline.  This was prescribed for patient, will monitor response.  Explained that pain is multifactorial in nature; definitve treatment for endometriosis still may not help reduce her chronic pain.  Continue NSAIDs as needed. Patient wants to defer any surgery/ hysterectomy for now Will need to get records from 2006 laparoscopy   Verita Schneiders, MD, FACOG Attending [place] for [place], [place]

## 2014-10-07 NOTE — Patient Instructions (Signed)
Return to clinic for any scheduled appointments or for any gynecologic concerns as needed.   

## 2014-10-13 ENCOUNTER — Telehealth: Payer: Self-pay | Admitting: *Deleted

## 2014-10-13 NOTE — Telephone Encounter (Signed)
Pt called stating that she has been constipated since her Depo last month. She stated that she took Ducolax over the weekend and had good results but the cramping was awful.    I suggested to try a fleets enema and then use Miralax on a daily basis.  She is in agreement to try this.

## 2014-10-21 ENCOUNTER — Telehealth: Payer: Self-pay | Admitting: *Deleted

## 2014-10-21 DIAGNOSIS — N809 Endometriosis, unspecified: Secondary | ICD-10-CM

## 2014-10-21 MED ORDER — IBUPROFEN 800 MG PO TABS: 800.0000 mg | ORAL_TABLET | Freq: Three times a day (TID) | ORAL | Status: DC | PRN

## 2014-10-21 MED ORDER — TRAMADOL HCL 50 MG PO TABS: 50.0000 mg | ORAL_TABLET | Freq: Four times a day (QID) | ORAL | Status: DC | PRN

## 2014-10-21 NOTE — Telephone Encounter (Signed)
Pt called in stating she has been in pain that is caused by her endometriosis with no relief with taking previously prescribed Amitriptyline. Spoke to Dr Harolyn Rutherford. Sent over Ibuprofen 800mg  and Tramadol 50mg  to pt pharmacy. Explained to pt that these medications are "AS NEEDED" and shall not be taken every 8 hours continuously. Pt expressed understanding.

## 2014-10-23 ENCOUNTER — Ambulatory Visit: Payer: Self-pay | Admitting: Family Medicine

## 2014-10-26 ENCOUNTER — Ambulatory Visit: Payer: Self-pay | Admitting: Family Medicine

## 2014-10-28 ENCOUNTER — Ambulatory Visit
Admission: RE | Admit: 2014-10-28 | Discharge: 2014-10-28 | Disposition: A | Discharge status: Home / Self Care | Payer: Commercial Managed Care - HMO | Source: Ambulatory Visit | Attending: Obstetrics & Gynecology | Admitting: Obstetrics & Gynecology

## 2014-10-28 DIAGNOSIS — Z1231 Encounter for screening mammogram for malignant neoplasm of breast: Secondary | ICD-10-CM | POA: Diagnosis not present

## 2014-10-28 DIAGNOSIS — Z01419 Encounter for gynecological examination (general) (routine) without abnormal findings: Secondary | ICD-10-CM

## 2014-11-02 ENCOUNTER — Other Ambulatory Visit: Payer: Self-pay | Admitting: Obstetrics & Gynecology

## 2014-11-02 ENCOUNTER — Telehealth: Payer: Self-pay | Admitting: Adult Health

## 2014-11-02 MED ORDER — MECLIZINE HCL 25 MG PO TABS: 25.0000 mg | ORAL_TABLET | Freq: Three times a day (TID) | ORAL | Status: DC | PRN

## 2014-11-02 NOTE — Telephone Encounter (Signed)
Pt called and needs refill  ondansetron (ZOFRAN) 4 MG tablet. States that she is having some vertigo with her migraines and would like a Rx for that as well. May call pt at [phone]

## 2014-11-02 NOTE — Telephone Encounter (Signed)
Please let patient know, I have called in meclizine 25 mg as needed for vertigo

## 2014-11-02 NOTE — Telephone Encounter (Signed)
Patient would like a Rx to take for Vertigo associated with Migraine.    Regarding Zofran, 6 Refills were sent at last OV.  I called the pharmacy and spoke with [facility].  She verified the patient has refills on file, and they will fill the Rx today.  I called the patient back.  She is aware.  Says if we need to call back about Vertigo Rx, it is okay to leave a message if she doesn't answer.

## 2014-11-02 NOTE — Telephone Encounter (Signed)
I called back and spoke with patient.  She is aware Rx has been called in and will call back if anything further is needed.

## 2014-11-02 NOTE — Addendum Note (Signed)
Addended by: Marcial Pacas on: 11/02/2014 05:27 PM   Modules accepted: Orders

## 2014-11-06 ENCOUNTER — Telehealth: Payer: Self-pay | Admitting: *Deleted

## 2014-11-06 NOTE — Telephone Encounter (Signed)
rcvd message from after hours to call patient. LMOM for pt to rtn call if needed

## 2014-11-10 ENCOUNTER — Encounter: Payer: Self-pay | Admitting: Obstetrics & Gynecology

## 2014-11-10 ENCOUNTER — Ambulatory Visit (INDEPENDENT_AMBULATORY_CARE_PROVIDER_SITE_OTHER): Payer: Commercial Managed Care - HMO | Admitting: Obstetrics & Gynecology

## 2014-11-10 VITALS — BP 130/85 | HR 79 | Ht 68.0 in | Wt 176.0 lb

## 2014-11-10 DIAGNOSIS — N809 Endometriosis, unspecified: Secondary | ICD-10-CM

## 2014-11-10 DIAGNOSIS — R102 Pelvic and perineal pain unspecified side: Secondary | ICD-10-CM

## 2014-11-10 DIAGNOSIS — N949 Unspecified condition associated with female genital organs and menstrual cycle: Secondary | ICD-10-CM | POA: Diagnosis not present

## 2014-11-10 DIAGNOSIS — G8929 Other chronic pain: Secondary | ICD-10-CM

## 2014-11-10 MED ORDER — GABAPENTIN 300 MG PO CAPS: 300.0000 mg | ORAL_CAPSULE | Freq: Three times a day (TID) | ORAL | Status: DC

## 2014-11-10 MED ORDER — TRAMADOL HCL 50 MG PO TABS: 50.0000 mg | ORAL_TABLET | Freq: Four times a day (QID) | ORAL | Status: DC | PRN

## 2014-11-10 NOTE — Patient Instructions (Signed)
Return to clinic for any scheduled appointments or for any gynecologic concerns as needed.   

## 2014-11-10 NOTE — Progress Notes (Signed)
CLINIC ENCOUNTER NOTE  History:  39 y.o. [id number] here today for follow up of chronic pelvic pain and presumed endometriosis. Please see previous notes from more details.  Had Depo Provera on 09/21/14, and has had constant pain since her last period on 10/13/14. Last period lasted for two weeks.  Feels pain has gotten worse. It is a sharp, crampy, searing, burning pain which makes it difficult to sleep or do any activities. Constantly has heating pad on abdomen, worse when standing, has to sit down or lie down.  Feels pain is associated with Depo Provera even though at her last visit in 10/07/14, she reported Depo Provera was helping.  Was also on Amitriptyline, feels it has not helped.  Of note, patient reported also having diagnoses of interstitial cystitis and irritable bowel syndrome; currently reports having constipation. She denies any other concerns.   Past Medical History  Diagnosis Date  . Chronic pelvic pain in female   . Endometriosis   . Asthma   . Arthritis   . Hyperlipidemia   . Hypertension   . Neck pain   . FH: breast cancer in first degree relative     Mother and sister both are BRCA positive; patient is BRCA negative  . Interstitial cystitis   . Irritable bowel disease     Past Surgical History  Procedure Laterality Date  . Tubal ligation    . Cholecystectomy    . Laparoscopy  2006   The following portions of the patient's history were reviewed and updated as appropriate: allergies, current medications, past family history, past medical history, past social history, past surgical history and problem list.   Health Maintenance:  LGSIL pap with positive HRHPV on 09/21/14; colposcopy on 09/29/14 showed CIN I -> need to repeat cotesting in one year.   Normal mammogram on 10/29/14.   Review of Systems:  Reports periodic pain in her pelvis, abdomen and back Comprehensive review of systems was otherwise negative.  Objective:  Physical Exam BP 130/85 mmHg  Pulse 79  Ht  $R'5\' 8"'dI$  (1.727 m)  Wt 176 lb (79.833 kg)  BMI 26.77 kg/m2  LMP 10/13/2014 CONSTITUTIONAL: Well-developed, well-nourished female in no acute distress.  HENT:  Normocephalic, atraumatic. External right and left ear normal. Oropharynx is clear and moist EYES: Conjunctivae and EOM are normal. Pupils are equal, round, and reactive to light. No scleral icterus.  NECK: Normal range of motion, supple, no masses SKIN: Skin is warm and dry. No rash noted. Not diaphoretic. No erythema. No pallor. [place]: Alert and oriented to person, place, and time. Normal reflexes, muscle tone coordination. No cranial nerve deficit noted. PSYCHIATRIC: Normal mood and affect. Normal behavior. Normal judgment and thought content. CARDIOVASCULAR: Normal heart rate noted RESPIRATORY: Effort and breath sounds normal, no problems with respiration noted ABDOMEN: Soft, no distention noted, diffuse mild lower abdominal tenderness, no rebound or guarding.   PELVIC: Deferred MUSCULOSKELETAL: Normal range of motion. No edema noted.  Labs and Imaging 09/29/2014 CLINICAL DATA: Right lower quadrant pain. Endometriosis. Chronic pelvic pain in female. LMP 08/31/2014. EXAM: TRANSABDOMINAL AND TRANSVAGINAL ULTRASOUND OF PELVIS TECHNIQUE: Both transabdominal and transvaginal ultrasound examinations of the pelvis were performed. Transabdominal technique was performed for global imaging of the pelvis including uterus, ovaries, adnexal regions, and pelvic cul-de-sac. It was necessary to proceed with endovaginal exam following the transabdominal exam to visualize the endometrium and ovaries. COMPARISON: None FINDINGS: Uterus Measurements: 9.6 x 5.6 x 6.9 cm. Retroflexed. No fibroids or other mass visualized. Endometrium Thickness:  16 mm. No focal abnormality visualized. Right ovary Measurements: 2.1 x 2.9 x 1.4 cm. Normal appearance/no adnexal mass. Left ovary Measurements: 3.5 x 2.6 x 1.9 Cm. Normal appearance/no adnexal mass.  Other findings No free fluid. IMPRESSION: Normal appearance of retroflexed uterus and both ovaries. No pelvic mass or other sonographic abnormality identified. Electronically Signed By: Earle Gell M.D. On: 09/29/2014 16:52   Assessment & Plan:   Explained that pain is multifactorial in nature; unsure of etiology of her pain.  Still waiting for records from her previous provider that prove existence of endometriosis. The defintive treatment for endometriosis which is hysterectomy still may not help reduce her chronic pain. She declines further Depo Provera, doe snot want Depo Lupron. Will continue NSAIDs, recommended restarting Amitriptyline and give if 4-6 weeks to see if this will help.  Will also try Neurontin in the meantime to see if this will help in neuromodulation.  Will monitor response.   Will await records from 2006 laparoscopy; patient to obtain from previous provider. Need operative and pathology reports.   Routine preventative health maintenance measures emphasized. Please refer to After Visit Summary for other counseling recommendations.    Total face-to-face time with patient: 15 minutes. Over 50% of encounter was spent on counseling and coordination of care.   Verita Schneiders, MD, [place] Attending [place] for [employer], [place]

## 2014-11-10 NOTE — Progress Notes (Signed)
Had depo in June, and has had constant pain since her last period on 10/13/14.  Last period lasted for two weeks.

## 2014-12-07 ENCOUNTER — Ambulatory Visit (INDEPENDENT_AMBULATORY_CARE_PROVIDER_SITE_OTHER): Payer: Commercial Managed Care - HMO | Admitting: *Deleted

## 2014-12-07 DIAGNOSIS — Z3042 Encounter for surveillance of injectable contraceptive: Secondary | ICD-10-CM

## 2014-12-07 DIAGNOSIS — Z23 Encounter for immunization: Secondary | ICD-10-CM

## 2014-12-07 MED ORDER — MEDROXYPROGESTERONE ACETATE 150 MG/ML IM SUSP
150.0000 mg | Freq: Once | INTRAMUSCULAR | Status: AC
  Administered 2015-08-16: 150 mg via INTRAMUSCULAR

## 2014-12-07 NOTE — Progress Notes (Signed)
Patient here today for her Depo Provera injection.

## 2014-12-15 ENCOUNTER — Ambulatory Visit: Payer: Self-pay | Admitting: Nurse Practitioner

## 2015-01-11 ENCOUNTER — Other Ambulatory Visit: Payer: Self-pay | Admitting: General Surgery

## 2015-01-12 ENCOUNTER — Other Ambulatory Visit: Payer: Self-pay | Admitting: General Surgery

## 2015-01-12 DIAGNOSIS — K603 Anal fistula: Secondary | ICD-10-CM

## 2015-01-13 ENCOUNTER — Other Ambulatory Visit: Payer: Self-pay | Admitting: General Surgery

## 2015-01-13 DIAGNOSIS — K603 Anal fistula: Secondary | ICD-10-CM

## 2015-01-13 DIAGNOSIS — L988 Other specified disorders of the skin and subcutaneous tissue: Secondary | ICD-10-CM

## 2015-03-01 ENCOUNTER — Ambulatory Visit (INDEPENDENT_AMBULATORY_CARE_PROVIDER_SITE_OTHER): Payer: Commercial Managed Care - HMO | Admitting: *Deleted

## 2015-03-01 DIAGNOSIS — N809 Endometriosis, unspecified: Secondary | ICD-10-CM | POA: Diagnosis not present

## 2015-03-01 DIAGNOSIS — Z3042 Encounter for surveillance of injectable contraceptive: Secondary | ICD-10-CM

## 2015-03-01 MED ORDER — MEDROXYPROGESTERONE ACETATE 150 MG/ML IM SUSP
150.0000 mg | Freq: Once | INTRAMUSCULAR | Status: AC
  Administered 2015-03-01: 150 mg via INTRAMUSCULAR

## 2015-03-01 NOTE — Progress Notes (Signed)
Patient here today for her Depo Provera injection.  

## 2015-03-22 ENCOUNTER — Encounter: Payer: Self-pay | Admitting: Nurse Practitioner

## 2015-03-22 ENCOUNTER — Ambulatory Visit: Payer: Self-pay

## 2015-03-22 ENCOUNTER — Ambulatory Visit (INDEPENDENT_AMBULATORY_CARE_PROVIDER_SITE_OTHER): Payer: Commercial Managed Care - HMO | Admitting: Nurse Practitioner

## 2015-03-22 ENCOUNTER — Telehealth: Payer: Self-pay | Admitting: Nurse Practitioner

## 2015-03-22 VITALS — BP 150/100 | HR 88

## 2015-03-22 VITALS — BP 143/90 | HR 90 | Ht 68.0 in | Wt 182.0 lb

## 2015-03-22 DIAGNOSIS — G43909 Migraine, unspecified, not intractable, without status migrainosus: Secondary | ICD-10-CM | POA: Diagnosis not present

## 2015-03-22 MED ORDER — KETOROLAC TROMETHAMINE 30 MG/ML IJ SOLN
30.0000 mg | Freq: Once | INTRAMUSCULAR | Status: AC
  Administered 2015-03-22: 30 mg via INTRAVENOUS

## 2015-03-22 MED ORDER — VALPROATE SODIUM 500 MG/5ML IV SOLN
1.0000 g | Freq: Once | INTRAVENOUS | Status: AC
  Administered 2015-03-22: 1000 mg via INTRAVENOUS

## 2015-03-22 MED ORDER — SODIUM CHLORIDE 0.9 % IV SOLN
500.0000 mg | Freq: Once | INTRAVENOUS | Status: AC
  Administered 2015-03-22: 500 mg via INTRAVENOUS

## 2015-03-22 MED ORDER — RIZATRIPTAN BENZOATE 10 MG PO TABS: 10.0000 mg | ORAL_TABLET | ORAL | Status: DC | PRN

## 2015-03-22 NOTE — Telephone Encounter (Signed)
Patient called to advise that she will need an IV infusion of Toradol to break migraine when she comes in for appointment with Cecille Rubin, NP today.

## 2015-03-22 NOTE — Patient Instructions (Addendum)
She will get migraine cocktail today Continue Topamax as preventive Can take tizanidine one half at bedtime daily Continue Maxalt acutely Follow-up in 6 months

## 2015-03-22 NOTE — Progress Notes (Signed)
[facility]  PATIENT: Anita Weaver DOB: 1975-05-16   REASON FOR VISIT: Follow-up for migraine HISTORY FROM: Patient    HISTORY OF PRESENT ILLNESS:Anita Weaver is a 39 years old right-handed Caucasian female, accompanied by her husband, referred by her primary care physician Dr. Manuella Ghazi for evaluation of migraine headaches  She reported a history of migraine headaches since age 35, at that time, she also has excessive stress, for a while, she been having headaches daily, since her separation with her previous husband, her headache has much improved, doing very well from 2013-2014, but since October 2014, she began to get migraine again, gradually getting worse, to almost daily basis, she has been taking Excedrin Migraine every day 2-4 tablets, her migraine headaches are usually lateralized severe pounding headaches with associated light noise sensitivity, nauseous, lasting for days. Previously she has tried different preventive medications including Topamax, moderate help, nortriptyline, no help, there was an 1 medication prescribed by her psychologist for her bipolar disorder, Depakote has been very helpful for him migraine Over-the-counter Tylenol, ibuprofen and is no longer helpful, Excedrin migraine only provide mild temporary relief, she has tried triptan treatment in the past, Imitrex causing heart racing, sweaty, difficulty breathing, Maxalt has been very helpful, but she was not able afford it because the high co-pay, Flexeril has dade her sleepy Trigger for her migraines, weather change, stress, caffeine, sleep deprivation  She had tubal ligations now, she also complains of worsening depression over the past few months, will set up appointment with her psychologist, she is not not on any medications UPDATE August 3rd 2015:  She came in with her husband today, She has tried Depakote ER 554m qhs for almost 2 months without helping, worry about side effect, Inderal 486mbid  did not help either.  She still complains of headaches 20 days in a month, Maxalt works well, headache would go away in 30 minutes, she used it up all 15 tabs of Maxalt in one month. She also complains of dizziness, spinning sensation, fall over, slurred speech  "i have a cyst on the back part of my brain" UPDATE June 2nd 2016: Last clinical visit was August 2015, MRI of the brain in September 2015 was essentially normal. She is not on any preventive medications now, for a while, her migraines well controlled by Maxalt, but since May 2016, she began to have frequent, almost daily headaches, left retro-orbital area moderate to severe pounding headache with associated light noise sensitivity, she been taking almost daily Maxalt, Which helps her, but only temporarily, She does not want to consider Botox injection as preventive medications, also complains of midline neck pain, achy sensation, no radiating pain, no bilateral upper extremity paresthesia and weakness. Topamax 10041mid works for her somewhat in the past, wants to be back on it UPDATE 03/22/2015 Anita Weaver, 39 41ar old female returns for follow-up. She is previously been seen by Dr. YanKrista Blue this office. Her headache today has been ongoing for several weeks it goes away with Maxalt but returns. Her pain level today is 8. She's also had some nausea. Pain is in the left retro-orbital area pounding associated with light sensitivity. She is currently on Topamax as a preventive. She also has Flexeril does not take that very much. She returns for reevaluation. In the last 3 months she has been to urgent care twice for Toradol injections. She wakes with some of her headaches in the morning   REVIEW OF SYSTEMS: Full 14 system review of systems performed  and notable only for those listed, all others are neg:  Constitutional: neg  Cardiovascular: neg Ear/Nose/Throat: neg  Skin: neg Eyes: neg Respiratory: Cough Gastroitestinal: Abdominal  pain Hematology/Lymphatic: neg  Endocrine: neg Musculoskeletal: Neck pain Stiffness Allergy/Immunology: neg Neurological: Headache Psychiatric: neg Sleep : neg   ALLERGIES: Allergies  Allergen Reactions  . Demerol [Meperidine] Other (See Comments)  . Demerol [Meperidine]   . Imitrex [Sumatriptan]     Heart palpitations, hot/cold sweats    HOME MEDICATIONS: Outpatient Prescriptions Prior to Visit  Medication Sig Dispense Refill  . cyclobenzaprine (FLEXERIL) 10 MG tablet Take 1 tablet (10 mg total) by mouth at bedtime. 30 tablet 6  . ibuprofen (ADVIL,MOTRIN) 800 MG tablet Take 1 tablet (800 mg total) by mouth every 8 (eight) hours as needed. 30 tablet 2  . ondansetron (ZOFRAN) 4 MG tablet Take 1 tablet (4 mg total) by mouth every 8 (eight) hours as needed for nausea or vomiting. 20 tablet 6  . rizatriptan (MAXALT) 10 MG tablet Take 1 tablet (10 mg total) by mouth as needed for migraine. May repeat in 2 hours if needed 15 tablet 6  . topiramate (TOPAMAX) 100 MG tablet Take 1 tablet (100 mg total) by mouth 2 (two) times daily. 60 tablet 6  . traMADol (ULTRAM) 50 MG tablet Take 1 tablet (50 mg total) by mouth every 6 (six) hours as needed. 30 tablet 2  . Vitamin D, Ergocalciferol, (DRISDOL) 50000 UNITS CAPS capsule   6  . amitriptyline (ELAVIL) 150 MG tablet Take 1 tablet (150 mg total) by mouth at bedtime. (Patient not taking: Reported on 11/10/2014) 30 tablet 5  . gabapentin (NEURONTIN) 300 MG capsule Take 1 capsule (300 mg total) by mouth 3 (three) times daily. (Patient not taking: Reported on 03/22/2015) 30 capsule 1  . meclizine (ANTIVERT) 25 MG tablet Take 1 tablet (25 mg total) by mouth 3 (three) times daily as needed for dizziness. (Patient not taking: Reported on 11/10/2014) 30 tablet 6  . metroNIDAZOLE (FLAGYL) 500 MG tablet TAKE 1 TABLET BY MOUTH 2 TIMES DAILY. (Patient not taking: Reported on 03/22/2015) 14 tablet 0   Facility-Administered Medications Prior to Visit  Medication  Dose Route Frequency Provider Last Rate Last Dose  . medroxyPROGESTERone (DEPO-PROVERA) injection 150 mg  150 mg Intramuscular Q90 days Osborne Oman, MD   150 mg at 12/07/14 1532  . medroxyPROGESTERone (DEPO-PROVERA) injection 150 mg  150 mg Intramuscular Once Emily Filbert, MD        PAST MEDICAL HISTORY: Past Medical History  Diagnosis Date  . Chronic pelvic pain in female   . Endometriosis   . Asthma   . Arthritis   . Hyperlipidemia   . Hypertension   . Neck pain   . FH: breast cancer in first degree relative     Mother and sister both are BRCA positive; patient is BRCA negative  . Interstitial cystitis   . Irritable bowel disease     PAST SURGICAL HISTORY: Past Surgical History  Procedure Laterality Date  . Tubal ligation    . Cholecystectomy    . Laparoscopy  2006    FAMILY HISTORY: Family History  Problem Relation Age of Onset  . Cancer Mother 42  . Cancer Sister 60    SOCIAL HISTORY: Social History   Social History  . Marital Status: Married    Spouse Name: N/A  . Number of Children: 3  . Years of Education: 12   Occupational History  . Not on file.  Social History Main Topics  . Smoking status: Never Smoker   . Smokeless tobacco: Never Used  . Alcohol Use: No     Comment: occasionally  . Drug Use: No  . Sexual Activity:    Partners: Male    Birth Control/ Protection: Surgical     Comment: tubaligation   Other Topics Concern  . Not on file   Social History Narrative   ** Merged History Encounter **       ** Data from: 02/09/14 Enc Dept: [facility]       ** Data from: 11/10/13 Enc Dept: [facility]   Patient lives at home with [name]   Patient works full time.   Right handed.   Caffeine- none   Education- high school     PHYSICAL EXAM  Filed Vitals:   03/22/15 1527  BP: 143/90  Pulse: 90  Height: _0  (1.727 m)  Weight: 182 lb (82.555 kg)   Body mass index is 27.68 kg/(m^2).  Generalized: Well  developed, in no acute distress  Head: normocephalic and atraumatic,. Oropharynx benign [place] 3 Neck: Supple, no carotid bruits neck size 14.5 Cardiac: Regular rate rhythm, no murmur  Musculoskeletal: No deformity   Neurological examination   [place]: Alert oriented to time, place, history taking. Attention span and concentration appropriate. Recent and remote memory intact.  Follows all commands speech and language fluent. ESS 8, FSS 39 Cranial nerve II-XII: Fundoscopic exam deferred due to migraine .Pupils were equal round reactive to light extraocular movements were full, visual field were full on confrontational test. Facial sensation and strength were normal. hearing was intact to finger rubbing bilaterally. Uvula tongue midline. Head turning and shoulder shrug were normal and symmetric.Tongue protrusion into cheek strength was normal. Motor: normal bulk and tone, full strength in the BUE, BLE, fine finger movements normal, no pronator drift. No focal weakness Sensory: normal and symmetric to light touch, pinprick, and  Vibration, proprioception  Coordination: finger-nose-finger, heel-to-shin bilaterally, no dysmetria Reflexes: Brachioradialis 2/2, biceps 2/2, triceps 2/2, patellar 2/2, Achilles 2/2, plantar responses were flexor bilaterally. Gait and Station: Rising up from seated position without assistance, normal stance,  moderate stride, good arm swing, smooth turning, able to perform tiptoe, and heel walking without difficulty. Tandem gait is steady  DIAGNOSTIC DATA (LABS, IMAGING, TESTING)   ASSESSMENT AND PLAN  39 y.o. year old female  has a past medical history of Asthma; Arthritis; Hyperlipidemia; Hypertension; Neck pain; and migraine headache here to follow-up She previously tried and failed Depakote ER 500 mg, Inderal, nortriptyline,Lamicta, magnesium oxide 400 mg twice a day, riboflavin 100 mg twice a day  She will get migraine cocktail today, Depacon Solu-Medrol and  Toradol Continue Topamax as preventive Can take tizanidine one half at bedtime daily Continue Maxalt acutely will refill May think about sleep study Follow-up in 6 months Dennie Bible, [facility], [facility], [facility] [address] [place], [place] [address] [phone]

## 2015-03-22 NOTE — Progress Notes (Signed)
Patient's migraine started at 38/10. After infusion, patient stated it was coming down and was at 6/10. Patient tolerated infusion well. Patient accompanied to check out and advised to call tomorrow if further intervention is needed for migraine.

## 2015-03-23 NOTE — Telephone Encounter (Signed)
I spoke to [place] in [place].   1400 good today.  Spoke to pt and she will come then.  I relayed that Dr. Krista Blue would like to try migraine cocktail again, along with compazine.  She asked about it working this time because it did not work last time.   I told her that Dr. Krista Blue aware of results and thinks it could work, would like to try again along with compazine if pt has a driver.   Information given to [place].

## 2015-03-23 NOTE — Telephone Encounter (Signed)
Patient called back, needs to know something this morning regarding if she is to come into the office, she has appointment elsewhere this afternoon. If you don't get her on [phone] it's ok to leave a message.

## 2015-03-23 NOTE — Telephone Encounter (Signed)
Pt called sts infusion did not break migraine. Did not help at all, sts she was at a 10 pain scale when she got home. She has taken a triptan this morning which did knock off the edge but she is still in pain. She has an appt with another provider this afternoon. If she is to come into office she would like to know early if she needs to cancel that appt. Please call her at [phone]

## 2015-03-23 NOTE — Telephone Encounter (Signed)
Discussed with Dr. Krista Blue she wants to repeat migraine cocktail. If she has a driver can also give compazine.

## 2015-03-23 NOTE — Progress Notes (Signed)
I have reviewed and agreed above plan. 

## 2015-04-02 ENCOUNTER — Other Ambulatory Visit: Payer: Self-pay | Admitting: Obstetrics & Gynecology

## 2015-04-15 ENCOUNTER — Encounter: Payer: Self-pay | Admitting: Gastroenterology

## 2015-04-15 ENCOUNTER — Ambulatory Visit (INDEPENDENT_AMBULATORY_CARE_PROVIDER_SITE_OTHER): Payer: Commercial Managed Care - HMO | Admitting: Gastroenterology

## 2015-04-15 ENCOUNTER — Other Ambulatory Visit: Payer: Self-pay

## 2015-04-15 VITALS — BP 133/76 | HR 86 | Temp 97.9°F | Ht 68.0 in | Wt 189.0 lb

## 2015-04-15 DIAGNOSIS — R1031 Right lower quadrant pain: Secondary | ICD-10-CM

## 2015-04-15 DIAGNOSIS — G8929 Other chronic pain: Secondary | ICD-10-CM

## 2015-04-15 DIAGNOSIS — R1032 Left lower quadrant pain: Secondary | ICD-10-CM | POA: Diagnosis not present

## 2015-04-15 DIAGNOSIS — R12 Heartburn: Secondary | ICD-10-CM

## 2015-04-15 NOTE — Progress Notes (Signed)
Gastroenterology Consultation  Referring Provider:     Roselee Nova, MD Primary Care Physician:  Keith Rake, MD Primary Gastroenterologist:  Dr. Allen Norris     Reason for Consultation:     Abdominal pain and heartburn        HPI:   Anita Weaver is a 40 y.o. y/o female referred for consultation & management of Abdominal pain and heartburn by Dr. Keith Rake, MD.  This patient comes today with a report of heartburn. She states that her heartburn is sometimes relieved with Maalox. She also has been to the emergency room for Grasshopper's. She denies any change in weight or recent diet but states that her symptoms have been more frequent recently. She also states that symptoms are associated with burning in the lower abdomen below the umbilicus. There is no report of any unexplained weight loss black stools or bloody stools. She also denies any vomiting although she states she has some nausea intermittently and takes Zofran for a history of migraine headaches. The patient has had a history of a hiatal hernia in the past and reports that she's had endoscopies to evaluate this in the past. He is no report of any dysphagia or change in her voice.   Past Medical History  Diagnosis Date  . Chronic pelvic pain in female   . Endometriosis   . Asthma   . Arthritis   . Hyperlipidemia   . Hypertension   . Neck pain   . FH: breast cancer in first degree relative     Mother and sister both are BRCA positive; patient is BRCA negative  . Interstitial cystitis   . Irritable bowel disease     Past Surgical History  Procedure Laterality Date  . Tubal ligation    . Cholecystectomy    . Laparoscopy  2006    Prior to Admission medications   Medication Sig Start Date End Date Taking? Authorizing Provider  albuterol (PROAIR HFA) 108 (90 Base) MCG/ACT inhaler Inhale into the lungs. 04/13/15  Yes Historical Provider, MD  ALPRAZolam Duanne Moron) 0.5 MG tablet Take 0.5 mg by mouth at bedtime as needed for anxiety.    Yes Historical Provider, MD  cholecalciferol (VITAMIN D) 1000 units tablet Take 1,000 Units by mouth daily.   Yes Historical Provider, MD  ibuprofen (ADVIL,MOTRIN) 800 MG tablet Take 1 tablet (800 mg total) by mouth every 8 (eight) hours as needed. 10/21/14  Yes Osborne Oman, MD  lurasidone (LATUDA) 40 MG TABS tablet Take 20 mg by mouth daily with breakfast.   Yes Historical Provider, MD  ondansetron (ZOFRAN) 4 MG tablet Take 1 tablet (4 mg total) by mouth every 8 (eight) hours as needed for nausea or vomiting. 09/10/14  Yes Marcial Pacas, MD  pantoprazole (PROTONIX) 40 MG tablet Take by mouth. 04/13/15 04/12/16 Yes Historical Provider, MD  predniSONE (DELTASONE) 50 MG tablet Take by mouth. 04/13/15 04/20/15 Yes Historical Provider, MD  rizatriptan (MAXALT) 10 MG tablet Take 1 tablet (10 mg total) by mouth as needed for migraine. May repeat in 2 hours if needed 03/22/15  Yes Dennie Bible, NP  traMADol (ULTRAM) 50 MG tablet Take 1 tablet (50 mg total) by mouth every 6 (six) hours as needed. 11/10/14  Yes Osborne Oman, MD  cyclobenzaprine (FLEXERIL) 10 MG tablet Take 1 tablet (10 mg total) by mouth at bedtime. 09/10/14   Marcial Pacas, MD  metroNIDAZOLE (FLAGYL) 500 MG tablet TAKE 1 TABLET BY MOUTH 2 TIMES DAILY.  Patient not taking: Reported on 04/15/2015 04/02/15   Osborne Oman, MD  ranitidine ([facility] RANITIDINE) 75 MG tablet Take by mouth. Reported on 04/15/2015    Historical Provider, MD  topiramate (TOPAMAX) 100 MG tablet Take 1 tablet (100 mg total) by mouth 2 (two) times daily. Patient not taking: Reported on 04/15/2015 09/10/14   Marcial Pacas, MD  Vitamin D, Ergocalciferol, (DRISDOL) 50000 UNITS CAPS capsule Reported on 04/15/2015 07/15/14   Historical Provider, MD    Family History  Problem Relation Age of Onset  . Cancer Mother 81  . Cancer Sister 33     Social History  Substance Use Topics  . Smoking status: Never Smoker   . Smokeless tobacco: Never Used  . Alcohol Use: No     Comment:  occasionally    Allergies as of 04/15/2015 - Review Complete 04/15/2015  Allergen Reaction Noted  . Demerol [meperidine] Other (See Comments) 04/18/2012  . Demerol [meperidine]  05/08/2013  . Flagyl [metronidazole] Hives 04/15/2015  . Imitrex [sumatriptan]  04/24/2014    Review of Systems:    All systems reviewed and negative except where noted in HPI.   Physical Exam:  BP 133/76 mmHg  Pulse 86  Temp(Src) 97.9 F (36.6 C) (Oral)  Ht '5\' 8"'$  (1.727 m)  Wt 189 lb (85.73 kg)  BMI 28.74 kg/m2 No LMP recorded. Psych:  Alert and cooperative. Normal mood and affect. General:   Alert,  Well-developed, well-nourished, pleasant and cooperative in NAD Head:  Normocephalic and atraumatic. Eyes:  Sclera clear, no icterus.   Conjunctiva pink. Ears:  Normal auditory acuity. Nose:  No deformity, discharge, or lesions. Mouth:  No deformity or lesions,oropharynx pink & moist. Neck:  Supple; no masses or thyromegaly. Lungs:  Respirations even and unlabored.  Clear throughout to auscultation.   No wheezes, crackles, or rhonchi. No acute distress. Heart:  Regular rate and rhythm; no murmurs, clicks, rubs, or gallops. Abdomen:  Normal bowel sounds.  No bruits.  Soft, non-tender and non-distended without masses, hepatosplenomegaly or hernias noted.  No guarding or rebound tenderness.  Negative Carnett sign.   Rectal:  Deferred.  Msk:  Symmetrical without gross deformities.  Good, equal movement & strength bilaterally. Pulses:  Normal pulses noted. Extremities:  No clubbing or edema.  No cyanosis. Neurologic:  Alert and oriented x3;  grossly normal neurologically. Skin:  Intact without significant lesions or rashes.  No jaundice. Lymph Nodes:  No significant cervical adenopathy. Psych:  Alert and cooperative. Normal mood and affect.  Imaging Studies: No results found.  Assessment and Plan:   Anita Weaver is a 40 y.o. y/o female Who comes today with a history of heartburn that has not been  helped with Protonix and Maalox. The patient is having acid breakthrough with four episodes per week of acid breakthrough. The patient has to take Maalox and states that it is only partially helping. The patient also has lower abdominal pain that is a burning sensation but not near her stomach. The patient will be given a trial of Dexilant and has been told to contact me if her symptoms improve whereupon she will be given a prescription. If the symptoms do not improve then acid reflux may not be the patient's problems. The patient has been explained the plan and agrees with it.   Note: This dictation was prepared with Dragon dictation along with smaller phrase technology. Any transcriptional errors that result from this process are unintentional.

## 2015-05-03 ENCOUNTER — Other Ambulatory Visit: Payer: Self-pay

## 2015-05-03 ENCOUNTER — Telehealth: Payer: Self-pay | Admitting: Gastroenterology

## 2015-05-03 DIAGNOSIS — K2100 Gastro-esophageal reflux disease with esophagitis, without bleeding: Secondary | ICD-10-CM

## 2015-05-03 MED ORDER — DEXLANSOPRAZOLE 60 MG PO CPDR: 60.0000 mg | DELAYED_RELEASE_CAPSULE | Freq: Every day | ORAL | Status: DC

## 2015-05-03 NOTE — Telephone Encounter (Signed)
Patient said Dexilant  Samples are working good for her and would like a script called into her pharmacy  [facility] [place]. Please call patient once done

## 2015-05-03 NOTE — Telephone Encounter (Signed)
Pt notified [facility] [place] per her request.

## 2015-05-17 ENCOUNTER — Ambulatory Visit: Payer: Commercial Managed Care - HMO

## 2015-05-18 ENCOUNTER — Ambulatory Visit (INDEPENDENT_AMBULATORY_CARE_PROVIDER_SITE_OTHER): Payer: Commercial Managed Care - HMO | Admitting: Obstetrics & Gynecology

## 2015-05-18 ENCOUNTER — Encounter: Payer: Self-pay | Admitting: Obstetrics & Gynecology

## 2015-05-18 VITALS — BP 131/85 | HR 91 | Resp 18 | Wt 196.0 lb

## 2015-05-18 DIAGNOSIS — Z3042 Encounter for surveillance of injectable contraceptive: Secondary | ICD-10-CM | POA: Diagnosis not present

## 2015-05-18 DIAGNOSIS — N643 Galactorrhea not associated with childbirth: Secondary | ICD-10-CM

## 2015-05-18 DIAGNOSIS — O926 Galactorrhea: Secondary | ICD-10-CM

## 2015-05-18 DIAGNOSIS — Z0000 Encounter for general adult medical examination without abnormal findings: Secondary | ICD-10-CM | POA: Diagnosis not present

## 2015-05-18 NOTE — Progress Notes (Signed)
Pt was moving furniture and slammed rt nipple in drawer, also c/o nipple discharge from both nipples.

## 2015-05-18 NOTE — Progress Notes (Signed)
   Subjective:    Patient ID: Anita Weaver, female    DOB: 1976/04/09, 40 y.o.   MRN: [id number]  HPI  40 yo lady here because she noted bilateral milky nipple discharge after she injured her right nipple.  She breastfed her first child.  Review of Systems She has had a BTL.    Objective:   Physical Exam [id number] Breathing, conversing and ambulating normally Both breasts produce milk with palpation       Assessment & Plan:  Galactorrhea- check TSH and prolactin Preventative care- re check Vit D (h/o low levels) and fasting lipids

## 2015-05-24 ENCOUNTER — Other Ambulatory Visit (INDEPENDENT_AMBULATORY_CARE_PROVIDER_SITE_OTHER): Payer: Commercial Managed Care - HMO | Admitting: *Deleted

## 2015-05-24 DIAGNOSIS — R635 Abnormal weight gain: Secondary | ICD-10-CM

## 2015-05-24 DIAGNOSIS — N6452 Nipple discharge: Secondary | ICD-10-CM

## 2015-05-24 DIAGNOSIS — Z01419 Encounter for gynecological examination (general) (routine) without abnormal findings: Secondary | ICD-10-CM

## 2015-05-24 LAB — LIPID PANEL
Cholesterol: 173 mg/dL (ref 125–200)
HDL: 33 mg/dL — ABNORMAL LOW (ref >=46)
LDL Cholesterol: 119 mg/dL (ref <130)
Total CHOL/HDL Ratio: 5.2 Ratio — ABNORMAL HIGH (ref <=5.0)
Triglycerides: 104 mg/dL (ref <150)
VLDL: 21 mg/dL (ref <30)

## 2015-05-24 LAB — POCT URINE PREGNANCY: Preg Test, Ur: NEGATIVE

## 2015-05-24 NOTE — Addendum Note (Signed)
Addended by: Lin Landsman C on: 05/24/2015 08:55 AM   Modules accepted: Orders

## 2015-05-24 NOTE — Progress Notes (Signed)
Patient here today for labs only. °

## 2015-05-25 ENCOUNTER — Telehealth: Payer: Self-pay | Admitting: *Deleted

## 2015-05-25 LAB — PROLACTIN: Prolactin: 19 ng/mL

## 2015-05-25 LAB — TSH: TSH: 1.95 mIU/L

## 2015-05-25 LAB — VITAMIN D 25 HYDROXY (VIT D DEFICIENCY, FRACTURES): Vit D, 25-Hydroxy: 27 ng/mL — ABNORMAL LOW (ref 30–100)

## 2015-05-25 NOTE — Telephone Encounter (Signed)
-----   Message from Francia Greaves sent at 05/25/2015  4:00 PM EST ----- Regarding: Test Results Contact: [phone] Wants to know Test Results

## 2015-05-27 ENCOUNTER — Telehealth: Payer: Self-pay | Admitting: *Deleted

## 2015-05-27 NOTE — Telephone Encounter (Signed)
-----   Message from Francia Greaves sent at 05/25/2015  4:00 PM EST ----- Regarding: Test Results Contact: [phone] Wants to know Test Results

## 2015-05-27 NOTE — Telephone Encounter (Signed)
Informed pt of results and that Prolactin level was normal, advised to continue an OTC Vitamin D supplement due to Vit D being slightly low.  Pt acknowledged instructions and will call back with any changes.

## 2015-07-26 ENCOUNTER — Telehealth: Payer: Self-pay | Admitting: Neurology

## 2015-07-26 NOTE — Telephone Encounter (Addendum)
States she has been battling this current migraine for 8-9 days.  Her home medications have only "knocked the edge off" her headache but have not rid it completely.  She is requesting treatment with IV medications.  States the last infusion cocktail she had in December 2016 was very helpful.  Records indicate she had the following on 03/23/15:  Solu-Medrol 500mg , Depakote 1000mg , Toradol 30mg  and Compazine 10mg .

## 2015-07-26 NOTE — Telephone Encounter (Signed)
Patient is calling and states she needs an appointment for her migraines today for an infusion.  Please call this morning at work [phone] or after 2:30 at home @[phone].

## 2015-07-26 NOTE — Telephone Encounter (Signed)
Called patient at work twice this morning - recording picked up both times and I left a generic message for a return call.  I also called Lacreshia's cell phone and had to leave a message.

## 2015-07-26 NOTE — Telephone Encounter (Signed)
Pt called inquiring what time her appt is for the infusion tomorrow. She is leaving home and to please call her on cell [phone].

## 2015-07-26 NOTE — Telephone Encounter (Signed)
Returned Leisel's call - Otila Kluver said to be here at 11am.

## 2015-08-02 MED ORDER — TOPIRAMATE 100 MG PO TABS: ORAL_TABLET | ORAL | Status: DC

## 2015-08-02 NOTE — Telephone Encounter (Signed)
Spoke to patient - she has to stay at work today and will not be able to come in for an infusion until Friday, 08/06/15 (this is due to her work schedule and her husband's work schedule).  She is going to continue home meds and if this persistent migraine resolves, prior to Friday, she will call to cancel treatment.  Otherwise, she will be at our office at [facility] on 08/06/15.  Patient has requested to restart Topamax.  Per vo by Dr. Krista Blue, she will provide rx for Topamax 100mg , 0.5 tab BID for one week then increase to 1 tab BID.  Rx has been sent to patient's pharmacy.

## 2015-08-02 NOTE — Telephone Encounter (Signed)
Patient called to request another infusion, states the infusion she had last week, didn't work. Please call before 2:30pm to schedule [phone], if after 2:30pm call [phone].

## 2015-08-02 NOTE — Telephone Encounter (Signed)
Left message for a return call

## 2015-08-02 NOTE — Telephone Encounter (Signed)
Ok, per vo by Dr. Krista Blue, to come in for the following IV:  Solu-Medrol 500mg , Depakote 1000mg , Toradol 30mg  and Compazine 10mg .           Patient will bring a driver.

## 2015-08-06 ENCOUNTER — Encounter: Payer: Self-pay | Admitting: Neurology

## 2015-08-16 ENCOUNTER — Ambulatory Visit (INDEPENDENT_AMBULATORY_CARE_PROVIDER_SITE_OTHER): Payer: Commercial Managed Care - HMO | Admitting: *Deleted

## 2015-08-16 DIAGNOSIS — Z3042 Encounter for surveillance of injectable contraceptive: Secondary | ICD-10-CM | POA: Diagnosis not present

## 2015-09-29 ENCOUNTER — Other Ambulatory Visit: Payer: Self-pay | Admitting: Nurse Practitioner

## 2015-09-30 ENCOUNTER — Other Ambulatory Visit: Payer: Self-pay

## 2015-09-30 MED ORDER — RIZATRIPTAN BENZOATE 10 MG PO TABS: 10.0000 mg | ORAL_TABLET | ORAL | Status: DC | PRN

## 2015-10-25 ENCOUNTER — Ambulatory Visit (INDEPENDENT_AMBULATORY_CARE_PROVIDER_SITE_OTHER): Payer: Commercial Managed Care - HMO | Admitting: Obstetrics & Gynecology

## 2015-10-25 ENCOUNTER — Encounter: Payer: Self-pay | Admitting: Obstetrics & Gynecology

## 2015-10-25 VITALS — BP 138/91 | HR 90 | Resp 16 | Wt 210.0 lb

## 2015-10-25 DIAGNOSIS — Z124 Encounter for screening for malignant neoplasm of cervix: Secondary | ICD-10-CM

## 2015-10-25 DIAGNOSIS — N870 Mild cervical dysplasia: Secondary | ICD-10-CM

## 2015-10-25 DIAGNOSIS — Z1151 Encounter for screening for human papillomavirus (HPV): Secondary | ICD-10-CM

## 2015-10-25 DIAGNOSIS — Z01419 Encounter for gynecological examination (general) (routine) without abnormal findings: Secondary | ICD-10-CM

## 2015-10-25 NOTE — Progress Notes (Signed)
Subjective:    Anita Weaver is a 40 y.o. MW P3 (65, 25, and 57 yo kids)- no kids at home now.  female who presents for an annual exam. The patient has no complaints today. She is disappointed in depo provera because she has gained 55 # since January. She started depo more than a year. She reports a normal TSH in the last few months. The patient is sexually active. 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 The patient wears seatbelts: yes. The patient participates in regular exercise: no. Has the patient ever been transfused or tattooed?: yes. The patient reports that there is not domestic violence in her life.   She is planning a hysterectomy in January with Dr. Cletis Media.  Menstrual History: OB History    Gravida Para Term Preterm AB TAB SAB Ectopic Multiple Living   3 3 0 0 0 0 0 0  4      Menarche age: 43  No LMP recorded.    The following portions of the patient's history were reviewed and updated as appropriate: allergies, current medications, past family history, past medical history, past social history, past surgical history and problem list.  Review of Systems Pertinent items are noted in HPI. Works for [employer] as a Pharmacist, hospital. Married for 3 years. She has had a BTL. She has only rare bleeding with depo provera.   Objective:    BP 138/91 mmHg  Pulse 90  Resp 16  Wt 210 lb (95.255 kg)  General Appearance:    Alert, cooperative, no distress, appears stated age  Head:    Normocephalic, without obvious abnormality, atraumatic  Eyes:    PERRL, conjunctiva/corneas clear, EOM's intact, fundi    benign, both eyes  Ears:    Normal TM's and external ear canals, both ears  Nose:   Nares normal, septum midline, mucosa normal, no drainage    or sinus tenderness  Throat:   Lips, mucosa, and tongue normal; teeth and gums normal  Neck:   Supple, symmetrical, trachea midline, no adenopathy;    thyroid:  no enlargement/tenderness/nodules; no carotid   bruit or JVD  Back:      Symmetric, no curvature, ROM normal, no CVA tenderness  Lungs:     Clear to auscultation bilaterally, respirations unlabored  Chest Wall:    No tenderness or deformity   Heart:    Regular rate and rhythm, S1 and S2 normal, no murmur, rub   or gallop  Breast Exam:    No tenderness, masses, or nipple abnormality  Abdomen:     Soft, non-tender, bowel sounds active all four quadrants,    no masses, no organomegaly  Genitalia:    Normal female without lesion, discharge or tenderness, NSSA, no palpable adnexal masses     Extremities:   Extremities normal, atraumatic, no cyanosis or edema  Pulses:   2+ and symmetric all extremities  Skin:   Skin color, texture, turgor normal, no rashes or lesions  Lymph nodes:   Cervical, supraclavicular, and axillary nodes normal  Neurologic:   CNII-XII intact, normal strength, sensation and reflexes    throughout  .    Assessment:    Healthy female exam.    Plan:     Thin prep Pap smear. with cotesting

## 2015-10-27 LAB — CYTOLOGY - PAP

## 2015-11-01 ENCOUNTER — Ambulatory Visit: Payer: Commercial Managed Care - HMO

## 2015-11-01 ENCOUNTER — Telehealth: Payer: Self-pay | Admitting: *Deleted

## 2015-11-01 NOTE — Telephone Encounter (Signed)
-----   Message from Emily Filbert, MD sent at 11/01/2015  1:25 PM EDT ----- rec Anita Weaver

## 2015-11-01 NOTE — Telephone Encounter (Signed)
Called pt, no answer, left message to call the office.  

## 2015-11-01 NOTE — Telephone Encounter (Signed)
Informed pt of pap result and recommendation for colposcopy based on results.  Pt declines colpo at this time and states that she will be having a hysterectomy in January and does no want to proceed further.

## 2015-11-02 ENCOUNTER — Ambulatory Visit (INDEPENDENT_AMBULATORY_CARE_PROVIDER_SITE_OTHER): Payer: Commercial Managed Care - HMO | Admitting: *Deleted

## 2015-11-02 DIAGNOSIS — Z3042 Encounter for surveillance of injectable contraceptive: Secondary | ICD-10-CM

## 2015-11-15 ENCOUNTER — Other Ambulatory Visit: Payer: Self-pay | Admitting: Neurology

## 2015-11-16 ENCOUNTER — Telehealth: Payer: Self-pay | Admitting: Neurology

## 2015-11-16 NOTE — Telephone Encounter (Signed)
Ok, per vo by Dr. Leta Baptist, for patient to come in for IV treatment.  Returned call to patient - she is checking with her husband on what time he is available to bring her.  She will call Otila Kluver back to let her know.

## 2015-11-16 NOTE — Telephone Encounter (Addendum)
Spoke to Anita Weaver - reports current migraine to be present for two weeks, in varying degrees of pain.  Also, complains of intermittent nausea.  She has tried and failed home medications repeatedly (Maxalt, Zofran) and is requesting IV treatment.  Her last infusion was on 08/06/15, where she received the following: Solu-Medrol 500mg , Depakote 1000mg , Toradol 30mg , Compazine 10mg .  States the last infusion was helpful in resolving pain.  Her husband is available to drive her, if IV is ordered.  She is aware that she needs to make a follow up appt and will schedule that time today, after checking her work calendar.

## 2015-11-16 NOTE — Telephone Encounter (Signed)
Agree with plan 

## 2015-11-16 NOTE — Telephone Encounter (Addendum)
Patient called to request appointment for IV Infusion for "really bad migraine cycle for 2 1/2 weeks". If calling before 2:30pm today, call [phone].

## 2015-11-18 ENCOUNTER — Other Ambulatory Visit: Payer: Self-pay | Admitting: Neurology

## 2015-11-18 MED ORDER — RIZATRIPTAN BENZOATE 10 MG PO TABS: ORAL_TABLET | ORAL | 0 refills | Status: DC

## 2015-11-18 NOTE — Telephone Encounter (Signed)
Dr Jaynee Eagles- are you ok with writing new rx for quantity 30 instead of 15? See message below. Pt last saw CM, NP on 03/22/15. Looks like CM, NP mentioned previously that she used all 15 tabs in a month.   Called and spoke to pt. She stated she called her insurance company and they told her there is no quantity limit on the maxalt. Her pharmacy told her that since Dr Felecia Shelling wrote same prescription for 15 tablets, that it was the same as previous rx that Dr Krista Blue wrote and they cannot fill this. It would have to be a new rx for quantity 30. I advised I will ask Dr Jaynee Eagles is she is ok with this, and if so, we will send to pharmacy. If not, we will call back. She verbalized understanding.   Patient coming tomorrow morning at 9am for migraine infusion with Otila Kluver (intrafusion).

## 2015-11-18 NOTE — Telephone Encounter (Signed)
Patient called to advise she has infusion appointment tomorrow, Dr. Krista Blue prescribed 15 pills of rizatriptan (MAXALT) 10 MG tablet and she "has already exceeded that", would like prescription for more or samples, "insurance won't pay for more until the 26th". Patient advised, we no longer dispense samples. Please call [phone].

## 2015-11-18 NOTE — Telephone Encounter (Signed)
Called patient back. Asked her to call her insurance company to find out if there is a quantity limit for her maxalt for each month. She verbalized understanding.

## 2015-11-18 NOTE — Telephone Encounter (Signed)
Pt returned Emma's call °

## 2015-11-18 NOTE — Telephone Encounter (Signed)
I will give her another 10 but not 30. She should not be taking more than 15 triptans in a month. She needs to follow up in the office with Dr. Krista Blue.

## 2015-11-18 NOTE — Telephone Encounter (Signed)
Faxed printed rx maxalt quantity 10. Received confirmation.

## 2015-11-18 NOTE — Telephone Encounter (Signed)
Called pt back. LVM for her to call back.   Need to know when the last time she picked up rx maxalt from pharmacy?  She should be taking 1 tablet at the onset of her headache and repeat in 2 hours if needed only once. She should not take more than 2 in 24 hours or 2-3 doses in a week.   Looks like Dr Felecia Shelling gave rx on 11/15/15 for quantity 15. Rx was printed.

## 2015-11-18 NOTE — Telephone Encounter (Signed)
Called patient back. She stated she was last given rx maxalt on 7/26 and she has no more tablets left. She was told her insurance will not fill new rx from Dr Felecia Shelling given on 11/15/15 d/t her exceeding max allowable. She states she has "tried many other medications and maxalt is the only thing that works for her". She states Dr Krista Blue had her on the disintegrating tablet but this was burning her tongue. This is why she switched to the regular tablet. Advised Dr Krista Blue out of the office this week. Dr Jaynee Eagles covering for her this pm. I will speak to her and call her back to advise. She verbalized understanding.

## 2015-11-19 ENCOUNTER — Encounter: Payer: Self-pay | Admitting: Neurology

## 2015-11-19 MED ORDER — RIZATRIPTAN BENZOATE 10 MG PO TBDP: 10.0000 mg | ORAL_TABLET | ORAL | 0 refills | Status: DC | PRN

## 2015-11-19 NOTE — Telephone Encounter (Signed)
Ok per Dr. Erlinda Hong to call in riztriptan odt.

## 2015-11-19 NOTE — Telephone Encounter (Signed)
Spoke to pt who is here for migraine IV infusion.  She is asking for prescription for rizatriptan ODT since she has already taken rizatriptan tablets and is not able to get this refilled until 12-04-15.  She states she cannot go without having some on hand.  [facility] [place].  Please advise.

## 2015-11-19 NOTE — Telephone Encounter (Signed)
I agree with the plan. Thanks  Rosalin Hawking, MD PhD Stroke Neurology 11/19/2015 1:49 PM

## 2015-11-22 NOTE — Telephone Encounter (Signed)
Called patient - left message requesting her to call back to schedule a follow up appointment.

## 2015-11-22 NOTE — Telephone Encounter (Signed)
I spoke to pt on Friday and let her know of prescription escribed to [place].

## 2015-12-14 ENCOUNTER — Ambulatory Visit: Payer: Commercial Managed Care - HMO | Admitting: Family Medicine

## 2015-12-29 ENCOUNTER — Other Ambulatory Visit: Payer: Self-pay | Admitting: Neurology

## 2016-01-18 ENCOUNTER — Ambulatory Visit (INDEPENDENT_AMBULATORY_CARE_PROVIDER_SITE_OTHER): Payer: Commercial Managed Care - HMO | Admitting: *Deleted

## 2016-01-18 DIAGNOSIS — Z3042 Encounter for surveillance of injectable contraceptive: Secondary | ICD-10-CM | POA: Diagnosis not present

## 2016-01-20 ENCOUNTER — Encounter: Payer: Self-pay | Admitting: Family Medicine

## 2016-01-20 ENCOUNTER — Ambulatory Visit (INDEPENDENT_AMBULATORY_CARE_PROVIDER_SITE_OTHER): Payer: Commercial Managed Care - HMO | Admitting: Family Medicine

## 2016-01-20 VITALS — BP 136/80 | HR 101 | Temp 98.5°F | Resp 18 | Ht 68.0 in | Wt 214.0 lb

## 2016-01-20 DIAGNOSIS — R5383 Other fatigue: Secondary | ICD-10-CM | POA: Insufficient documentation

## 2016-01-20 DIAGNOSIS — G43709 Chronic migraine without aura, not intractable, without status migrainosus: Secondary | ICD-10-CM | POA: Diagnosis not present

## 2016-01-20 LAB — CBC WITH DIFFERENTIAL/PLATELET
Basophils Absolute: 0 cells/uL (ref 0–200)
Basophils Relative: 0 %
Eosinophils Absolute: 470 cells/uL (ref 15–500)
Eosinophils Relative: 5 %
HCT: 43.2 % (ref 35.0–45.0)
Hemoglobin: 14.9 g/dL (ref 11.7–15.5)
Lymphocytes Relative: 43 %
Lymphs Abs: 4042 cells/uL — ABNORMAL HIGH (ref 850–3900)
MCH: 30 pg (ref 27.0–33.0)
MCHC: 34.5 g/dL (ref 32.0–36.0)
MCV: 87.1 fL (ref 80.0–100.0)
MPV: 8 fL (ref 7.5–12.5)
Monocytes Absolute: 752 cells/uL (ref 200–950)
Monocytes Relative: 8 %
Neutro Abs: 4136 cells/uL (ref 1500–7800)
Neutrophils Relative %: 44 %
Platelets: 280 10*3/uL (ref 140–400)
RBC: 4.96 MIL/uL (ref 3.80–5.10)
RDW: 13.5 % (ref 11.0–15.0)
WBC: 9.4 10*3/uL (ref 3.8–10.8)

## 2016-01-20 MED ORDER — RIZATRIPTAN BENZOATE 10 MG PO TABS: ORAL_TABLET | ORAL | 0 refills | Status: DC

## 2016-01-20 MED ORDER — TOPIRAMATE 50 MG PO TABS: ORAL_TABLET | ORAL | 2 refills | Status: DC

## 2016-01-20 NOTE — Progress Notes (Signed)
Name: Anita Weaver   MRN: [id number]    DOB: 10/30/1975   Date:01/20/2016       Progress Note  Subjective  Chief Complaint  Chief Complaint  Patient presents with  . Medication Refill    rizatriptan / topamax    Migraine   This is a chronic problem. The pain is located in the retro-orbital region. The quality of the pain is described as throbbing. The pain is at a severity of 5/10 (Depends on the episode, normally it starts off low and then progressively gets worse.). Associated symptoms include nausea, phonophobia and photophobia. Pertinent negatives include no abdominal pain, blurred vision, scalp tenderness, seizures, tinnitus or visual change. The symptoms are aggravated by bright light and noise. She has tried triptans for the symptoms.    Fatigue: Presents for evaluation of fatigue, described as being exhausted, feeling tired all the time even after she sleeps well at night does not feel rested. SHe wishes to be tested for anemia, has no symptoms of acute blood loss except for easy bruising which is chronic and stable. She had testing for thyroid done in February and was normal.   Past Medical History:  Diagnosis Date  . Arthritis   . Asthma   . Chronic pelvic pain in female   . Endometriosis   . FH: breast cancer in first degree relative    Mother and sister both are BRCA positive; patient is BRCA negative  . Hyperlipidemia   . Hypertension   . Interstitial cystitis   . Irritable bowel disease   . Neck pain     Past Surgical History:  Procedure Laterality Date  . CHOLECYSTECTOMY    . LAPAROSCOPY  2006  . TUBAL LIGATION      Family History  Problem Relation Age of Onset  . Cancer Mother 10  . Cancer Sister 19    Social History   Social History  . Marital status: Married    Spouse name: N/A  . Number of children: 3  . Years of education: 12   Occupational History  . Not on file.   Social History Main Topics  . Smoking status: Never Smoker  .  Smokeless tobacco: Never Used  . Alcohol use No     Comment: occasionally  . Drug use: No  . Sexual activity: Yes    Partners: Male    Birth control/ protection: Surgical     Comment: tubaligation   Other Topics Concern  . Not on file   Social History Narrative   ** Merged History Encounter **       ** Data from: 02/09/14 Enc Dept: [facility]       ** Data from: 11/10/13 Enc Dept: [facility]   Patient lives at home with [name]   Patient works full time.   Right handed.   Caffeine- none   Education- high school     Current Outpatient Prescriptions:  .  ALPRAZolam (XANAX) 0.5 MG tablet, Take 0.5 mg by mouth at bedtime as needed for anxiety., Disp: , Rfl:  .  cyclobenzaprine (FLEXERIL) 10 MG tablet, Take 1 tablet (10 mg total) by mouth at bedtime., Disp: 30 tablet, Rfl: 6 .  ergocalciferol (VITAMIN D2) 50000 units capsule, Take by mouth., Disp: , Rfl:  .  ibuprofen (ADVIL,MOTRIN) 800 MG tablet, Take 1 tablet (800 mg total) by mouth every 8 (eight) hours as needed., Disp: 30 tablet, Rfl: 2 .  lurasidone (LATUDA) 40 MG TABS tablet, Take  20 mg by mouth daily with breakfast., Disp: , Rfl:  .  ondansetron (ZOFRAN) 4 MG tablet, TAKE 1 TABLET (4 MG TOTAL) BY MOUTH EVERY 8 (EIGHT) HOURS AS NEEDED FOR NAUSEA OR VOMITING., Disp: 20 tablet, Rfl: 0 .  ranitidine ([facility] RANITIDINE) 75 MG tablet, Take by mouth. Reported on 04/15/2015, Disp: , Rfl:  .  rizatriptan (MAXALT) 10 MG tablet, Take one tablet at the onset of migraine.  May repeat once in 2 hours if needed.  MUST BE SEEN PRIOR TO FUTURE REFILLS. PLEASE CALL [phone] FOR AN APPT., Disp: 10 tablet, Rfl: 0 .  topiramate (TOPAMAX) 100 MG tablet, Take 0.5 tablets BID for one week then increase to 1 tablet BID thereafter., Disp: 60 tablet, Rfl: 5 .  albuterol (VENTOLIN HFA) 108 (90 Base) MCG/ACT inhaler, Inhale into the lungs., Disp: , Rfl:  .  oxyCODONE-acetaminophen (PERCOCET/ROXICET) 5-325 MG tablet, Take by mouth.,  Disp: , Rfl:  .  rizatriptan (MAXALT-MLT) 10 MG disintegrating tablet, Take 1 tablet (10 mg total) by mouth as needed for migraine. May repeat in 2 hours if needed (Patient not taking: Reported on 01/20/2016), Disp: 10 tablet, Rfl: 0  Current Facility-Administered Medications:  .  medroxyPROGESTERone (DEPO-PROVERA) injection 150 mg, 150 mg, Intramuscular, Q90 days, Osborne Oman, MD, 150 mg at 01/18/16 1509  Allergies  Allergen Reactions  . Imitrex [Sumatriptan] Other (See Comments)    Heart palpitations, hot/cold sweats  . Demerol [Meperidine] Other (See Comments)  . Demerol [Meperidine]   . Flagyl [Metronidazole] Hives     Review of Systems  HENT: Negative for tinnitus.   Eyes: Positive for photophobia. Negative for blurred vision.  Gastrointestinal: Positive for nausea. Negative for abdominal pain.  Neurological: Negative for seizures.    Objective  Vitals:   01/20/16 1502  BP: 136/80  Pulse: (!) 101  Resp: 18  Temp: 98.5 F (36.9 C)  TempSrc: Oral  SpO2: 97%  Weight: 214 lb (97.1 kg)  Height: '5\' 8"'$  (1.727 m)    Physical Exam  Constitutional: She is oriented to person, place, and time and well-developed, well-nourished, and in no distress.  HENT:  Head: Normocephalic and atraumatic.  Eyes: Pupils are equal, round, and reactive to light.  Cardiovascular: Normal rate, regular rhythm and normal heart sounds.   No murmur heard. Pulmonary/Chest: Effort normal and breath sounds normal. She has no wheezes.  Neurological: She is alert and oriented to person, place, and time.  Psychiatric: Mood, memory, affect and judgment normal.  Nursing note and vitals reviewed.   Assessment & Plan  1. Chronic migraine without aura without status migrainosus, not intractable Changed Topamax to 50 mg twice a day - rizatriptan (MAXALT) 10 MG tablet; Take one tablet at the onset of migraine.  May repeat once in 2 hours if needed.  MUST BE SEEN PRIOR TO FUTURE REFILLS. PLEASE CALL  [phone] FOR AN APPT.  Dispense: 30 tablet; Refill: 0 - topiramate (TOPAMAX) 50 MG tablet; Start 25 mg po qhs x 1 wk, then increase to 1 tab po BID  Dispense: 60 tablet; Refill: 2  2. Fatigue, unspecified type Patient wishes to check her levels of hemoglobin and hematocrit rule out anemia. - CBC with Differential   Jensine Luz [facility] A[facility] 01/20/2016 3:44 PM

## 2016-01-24 ENCOUNTER — Telehealth: Payer: Self-pay | Admitting: Family Medicine

## 2016-01-24 NOTE — Telephone Encounter (Signed)
PT WANTS RESULTS FROM TEST LAST WEEK. CAN LEAVE A MESSAGE ON CELL PHONE

## 2016-01-25 NOTE — Telephone Encounter (Signed)
Patient has been notified of lab results  

## 2016-02-21 ENCOUNTER — Encounter: Payer: Self-pay | Admitting: Family Medicine

## 2016-02-21 ENCOUNTER — Ambulatory Visit (INDEPENDENT_AMBULATORY_CARE_PROVIDER_SITE_OTHER): Payer: Commercial Managed Care - HMO | Admitting: Family Medicine

## 2016-02-21 VITALS — BP 132/67 | HR 97 | Temp 98.0°F | Resp 17 | Ht 68.0 in | Wt 213.4 lb

## 2016-02-21 DIAGNOSIS — G43709 Chronic migraine without aura, not intractable, without status migrainosus: Secondary | ICD-10-CM

## 2016-02-21 DIAGNOSIS — R110 Nausea: Secondary | ICD-10-CM | POA: Diagnosis not present

## 2016-02-21 MED ORDER — ONDANSETRON HCL 4 MG PO TABS: 4.0000 mg | ORAL_TABLET | Freq: Three times a day (TID) | ORAL | 0 refills | Status: DC | PRN

## 2016-02-21 MED ORDER — TOPIRAMATE 50 MG PO TABS: 50.0000 mg | ORAL_TABLET | Freq: Two times a day (BID) | ORAL | 0 refills | Status: DC

## 2016-02-21 MED ORDER — RIZATRIPTAN BENZOATE 10 MG PO TABS: ORAL_TABLET | ORAL | 0 refills | Status: DC

## 2016-02-21 NOTE — Progress Notes (Signed)
Name: Anita Weaver   MRN: [id number]    DOB: [birth year]   Date:02/21/2016       Progress Note  Subjective  Chief Complaint  Chief Complaint  Patient presents with  . Follow-up    1 mo  . Medication Refill    Migraine   This is a chronic problem. Episode frequency: decreased since she has been on Topamax. The pain is located in the retro-orbital region. The quality of the pain is described as throbbing. The pain is at a severity of 5/10 (Depends on the episode, normally it starts off low and then progressively gets worse.). Associated symptoms include nausea, phonophobia and photophobia. Pertinent negatives include no abdominal pain, blurred vision, scalp tenderness, seizures, tinnitus or visual change. The symptoms are aggravated by bright light and noise. She has tried triptans and ergotamines for the symptoms. The treatment provided significant relief. Her past medical history is significant for migraine headaches.    Past Medical History:  Diagnosis Date  . Arthritis   . Asthma   . Chronic pelvic pain in female   . Endometriosis   . FH: breast cancer in first degree relative    Mother and sister both are BRCA positive; patient is BRCA negative  . Hyperlipidemia   . Hypertension   . Interstitial cystitis   . Irritable bowel disease   . Neck pain     Past Surgical History:  Procedure Laterality Date  . CHOLECYSTECTOMY    . LAPAROSCOPY  2006  . TUBAL LIGATION      Family History  Problem Relation Age of Onset  . Cancer Mother 45  . Cancer Sister 76    Social History   Social History  . Marital status: Married    Spouse name: N/A  . Number of children: 3  . Years of education: 12   Occupational History  . Not on file.   Social History Main Topics  . Smoking status: Never Smoker  . Smokeless tobacco: Never Used  . Alcohol use No     Comment: occasionally  . Drug use: No  . Sexual activity: Yes    Partners: Male    Birth control/ protection: Surgical   Comment: tubaligation   Other Topics Concern  . Not on file   Social History Narrative   ** Merged History Encounter **       ** Data from: 02/09/14 Enc Dept: [facility]       ** Data from: 11/10/13 Enc Dept: [facility]   Patient lives at home with [name]   Patient works full time.   Right handed.   Caffeine- none   Education- high school     Current Outpatient Prescriptions:  .  albuterol (VENTOLIN HFA) 108 (90 Base) MCG/ACT inhaler, Inhale into the lungs., Disp: , Rfl:  .  ALPRAZolam (XANAX) 0.5 MG tablet, Take 0.5 mg by mouth at bedtime as needed for anxiety., Disp: , Rfl:  .  ergocalciferol (VITAMIN D2) 50000 units capsule, Take by mouth., Disp: , Rfl:  .  ibuprofen (ADVIL,MOTRIN) 800 MG tablet, Take 1 tablet (800 mg total) by mouth every 8 (eight) hours as needed., Disp: 30 tablet, Rfl: 2 .  lurasidone (LATUDA) 40 MG TABS tablet, Take 20 mg by mouth daily with breakfast., Disp: , Rfl:  .  ondansetron (ZOFRAN) 4 MG tablet, TAKE 1 TABLET (4 MG TOTAL) BY MOUTH EVERY 8 (EIGHT) HOURS AS NEEDED FOR NAUSEA OR VOMITING., Disp: 20 tablet, Rfl: 0 .  ranitidine ([facility] RANITIDINE) 75 MG tablet, Take by mouth. Reported on 04/15/2015, Disp: , Rfl:  .  rizatriptan (MAXALT) 10 MG tablet, Take one tablet at the onset of migraine.  May repeat once in 2 hours if needed.  MUST BE SEEN PRIOR TO FUTURE REFILLS. PLEASE CALL [phone] FOR AN APPT., Disp: 30 tablet, Rfl: 0 .  rizatriptan (MAXALT-MLT) 10 MG disintegrating tablet, Take 1 tablet (10 mg total) by mouth as needed for migraine. May repeat in 2 hours if needed, Disp: 10 tablet, Rfl: 0 .  topiramate (TOPAMAX) 50 MG tablet, Start 25 mg po qhs x 1 wk, then increase to 1 tab po BID, Disp: 60 tablet, Rfl: 2  Current Facility-Administered Medications:  .  medroxyPROGESTERone (DEPO-PROVERA) injection 150 mg, 150 mg, Intramuscular, Q90 days, Osborne Oman, MD, 150 mg at 01/18/16 1509  Allergies  Allergen Reactions  . Imitrex  [Sumatriptan] Other (See Comments)    Heart palpitations, hot/cold sweats  . Demerol [Meperidine] Other (See Comments)  . Demerol [Meperidine]   . Flagyl [Metronidazole] Hives     Review of Systems  HENT: Negative for tinnitus.   Eyes: Positive for photophobia. Negative for blurred vision.  Gastrointestinal: Positive for nausea. Negative for abdominal pain.  Neurological: Negative for seizures.    Objective  Vitals:   02/21/16 1455  BP: 132/67  Pulse: 97  Resp: 17  Temp: 98 F (36.7 C)  TempSrc: Oral  SpO2: 96%  Weight: 213 lb 6.4 oz (96.8 kg)  Height: _0  (1.727 m)    Physical Exam  Constitutional: She is oriented to person, place, and time and well-developed, well-nourished, and in no distress.  HENT:  Head: Normocephalic and atraumatic.  Eyes: Pupils are equal, round, and reactive to light.  Cardiovascular: Normal rate, regular rhythm and normal heart sounds.   No murmur heard. Pulmonary/Chest: Effort normal and breath sounds normal. She has no wheezes.  Neurological: She is alert and oriented to person, place, and time.  Psychiatric: Mood, memory, affect and judgment normal.  Nursing note and vitals reviewed.   Assessment & Plan  1. Chronic migraine without aura without status migrainosus, not intractable Stable, takes Topamax daily which prevents migraines. Takes Maxalt for breakthrough pain - topiramate (TOPAMAX) 50 MG tablet; Take 1 tablet (50 mg total) by mouth 2 (two) times daily.  Dispense: 180 tablet; Refill: 0 - rizatriptan (MAXALT) 10 MG tablet; Take one tablet at the onset of migraine.  May repeat once in 2 hours if needed. Dispense: 30 tablet; Refill: 0  2. Nausea Associated w/ migraines, refills for Zofran are provided - ondansetron (ZOFRAN) 4 MG tablet; Take 1 tablet (4 mg total) by mouth every 8 (eight) hours as needed for nausea or vomiting.  Dispense: 90 tablet; Refill: 0   Elinore Shults [facility]ad [facility] 02/21/2016 3:34 PM

## 2016-02-22 ENCOUNTER — Other Ambulatory Visit: Payer: Self-pay | Admitting: Obstetrics and Gynecology

## 2016-02-25 ENCOUNTER — Telehealth: Payer: Self-pay | Admitting: Family Medicine

## 2016-02-25 NOTE — Telephone Encounter (Signed)
PT SAID THAT YOU WROTE ALL HER OTHER MEDICATIONS FOR A 3 MONTH SUPPLY EXCEPT FOE HER MIGRAINE MEDICATION AND SHE NEEDS THAT TO BE A 90 DAY SUPPLY ALSO FOR YOU HAVING HER COMING BACK IN 3 MONTHS. WHY WAS THIS MEDICATION DONE FOR FOR ONLY 30 DAYS. [facility] [place].

## 2016-02-29 NOTE — Telephone Encounter (Signed)
This depends on how often she is taking Maxalt which is a medication for breakthrough migraines. It is normally taken at the onset of migraine followed by 2 hours later if symptoms are still not resolved. Once taken for breakthrough migraines mouth it is recommended to avoid taking Maxalt again for next 3 days. Please confirm the frequency of migraine headaches with the patient. Topiramate (which she is on) is usually helpful to prevent the onset of migraines.

## 2016-04-06 ENCOUNTER — Other Ambulatory Visit: Payer: Self-pay | Admitting: Gastroenterology

## 2016-04-12 ENCOUNTER — Ambulatory Visit: Payer: Commercial Managed Care - HMO | Admitting: *Deleted

## 2016-04-12 DIAGNOSIS — Z30013 Encounter for initial prescription of injectable contraceptive: Secondary | ICD-10-CM

## 2016-04-12 MED ORDER — MEDROXYPROGESTERONE ACETATE 150 MG/ML IM SUSP: 150.0000 mg | INTRAMUSCULAR | 0 refills | Status: DC

## 2016-04-13 NOTE — Telephone Encounter (Signed)
Patient needs a refill on Dexilant. [facility] [place]

## 2016-04-26 ENCOUNTER — Other Ambulatory Visit: Payer: Self-pay | Admitting: Family Medicine

## 2016-04-26 DIAGNOSIS — G43709 Chronic migraine without aura, not intractable, without status migrainosus: Secondary | ICD-10-CM

## 2016-05-15 ENCOUNTER — Encounter: Payer: Self-pay | Admitting: Family Medicine

## 2016-05-15 ENCOUNTER — Ambulatory Visit (INDEPENDENT_AMBULATORY_CARE_PROVIDER_SITE_OTHER): Payer: Commercial Managed Care - HMO | Admitting: Family Medicine

## 2016-05-15 DIAGNOSIS — L259 Unspecified contact dermatitis, unspecified cause: Secondary | ICD-10-CM | POA: Insufficient documentation

## 2016-05-15 DIAGNOSIS — G43709 Chronic migraine without aura, not intractable, without status migrainosus: Secondary | ICD-10-CM | POA: Diagnosis not present

## 2016-05-15 DIAGNOSIS — L249 Irritant contact dermatitis, unspecified cause: Secondary | ICD-10-CM

## 2016-05-15 MED ORDER — TOPIRAMATE 50 MG PO TABS: 50.0000 mg | ORAL_TABLET | Freq: Two times a day (BID) | ORAL | 0 refills | Status: DC

## 2016-05-15 MED ORDER — RIZATRIPTAN BENZOATE 10 MG PO TABS: ORAL_TABLET | ORAL | 2 refills | Status: DC

## 2016-05-15 MED ORDER — TRIAMCINOLONE ACETONIDE 0.1 % EX CREA: 1.0000 "application " | TOPICAL_CREAM | Freq: Two times a day (BID) | CUTANEOUS | 0 refills | Status: DC

## 2016-05-15 NOTE — Progress Notes (Signed)
Name: Anita Weaver   MRN: [id number]    DOB: Jun 05, 1975   Date:05/15/2016       Progress Note  Subjective  Chief Complaint  Chief Complaint  Patient presents with  . Medication Refill  . Rash    on neck on and off for a year    Rash  This is a recurrent problem. The current episode started more than 1 year ago. The problem has been waxing and waning since onset. The affected locations include the neck. The rash is characterized by itchiness, redness and burning. She was exposed to nothing. Associated symptoms include fatigue. Pertinent negatives include no cough, fever, joint pain or sore throat. Past treatments include anti-itch cream and topical steroids. The treatment provided mild relief.   Chronic Migraine Headaches:  She has history of chronic migraine, symptoms include headache, sometimes has nausea, sometimes has photo and phonophobia, she takes Mxxalt at the onset of migraines and Topamax to prevent migraine attacks. She has been taking both medications as prescribed.       Past Medical History:  Diagnosis Date  . Arthritis   . Asthma   . Chronic pelvic pain in female   . Endometriosis   . FH: breast cancer in first degree relative    Mother and sister both are BRCA positive; patient is BRCA negative  . Hyperlipidemia   . Hypertension   . Interstitial cystitis   . Irritable bowel disease   . Neck pain     Past Surgical History:  Procedure Laterality Date  . CHOLECYSTECTOMY    . LAPAROSCOPY  2006  . TUBAL LIGATION      Family History  Problem Relation Age of Onset  . Cancer Mother 39  . Cancer Sister 28    Social History   Social History  . Marital status: Married    Spouse name: N/A  . Number of children: 3  . Years of education: 12   Occupational History  . Not on file.   Social History Main Topics  . Smoking status: Never Smoker  . Smokeless tobacco: Never Used  . Alcohol use No     Comment: occasionally  . Drug use: No  . Sexual activity:  Yes    Partners: Male    Birth control/ protection: Surgical     Comment: tubaligation   Other Topics Concern  . Not on file   Social History Narrative   ** Merged History Encounter **       ** Data from: 02/09/14 Enc Dept: [facility]       ** Data from: 11/10/13 Enc Dept: [facility]   Patient lives at home with her husband Anita Weaver)   Patient works full time.   Right handed.   Caffeine- none   Education- high school     Current Outpatient Prescriptions:  .  ALPRAZolam (XANAX) 0.5 MG tablet, Take 0.5 mg by mouth at bedtime as needed for anxiety., Disp: , Rfl:  .  DEXILANT 60 MG capsule, TAKE 1 CAPSULE BY MOUTH DAILY, Disp: 30 capsule, Rfl: 6 .  ergocalciferol (VITAMIN D2) 50000 units capsule, Take by mouth., Disp: , Rfl:  .  ibuprofen (ADVIL,MOTRIN) 800 MG tablet, Take 1 tablet (800 mg total) by mouth every 8 (eight) hours as needed., Disp: 30 tablet, Rfl: 2 .  lurasidone (LATUDA) 40 MG TABS tablet, Take 20 mg by mouth daily with breakfast., Disp: , Rfl:  .  medroxyPROGESTERone (DEPO-PROVERA) 150 MG/ML injection, Inject 1 mL (150 mg  total) into the muscle every 3 (three) months., Disp: 1 mL, Rfl: 0 .  ondansetron (ZOFRAN) 4 MG tablet, Take 1 tablet (4 mg total) by mouth every 8 (eight) hours as needed for nausea or vomiting., Disp: 90 tablet, Rfl: 0 .  ranitidine ([facility] RANITIDINE) 75 MG tablet, Take by mouth. Reported on 04/15/2015, Disp: , Rfl:  .  rizatriptan (MAXALT) 10 MG tablet, Take one tablet at the onset of migraine.  May repeat once in 2 hours if needed.  MUST BE SEEN PRIOR TO FUTURE REFILLS. PLEASE CALL [phone] FOR AN APPT., Disp: 30 tablet, Rfl: 0 .  topiramate (TOPAMAX) 50 MG tablet, Take 1 tablet (50 mg total) by mouth 2 (two) times daily., Disp: 180 tablet, Rfl: 1  Allergies  Allergen Reactions  . Imitrex [Sumatriptan] Other (See Comments)    Heart palpitations, hot/cold sweats  . Demerol [Meperidine] Other (See Comments)  . Demerol [Meperidine]   .  Flagyl [Metronidazole] Hives     Review of Systems  Constitutional: Positive for fatigue and malaise/fatigue. Negative for chills and fever.  HENT: Negative for sore throat.   Respiratory: Negative for cough.   Musculoskeletal: Negative for joint pain.  Skin: Positive for itching and rash.  Neurological: Positive for headaches.    Objective  Vitals:   05/15/16 1455  BP: 124/70  Pulse: 94  Resp: 16  Temp: 98.4 F (36.9 C)  TempSrc: Oral  SpO2: 96%  Weight: 213 lb 1.6 oz (96.7 kg)  Height: _0  (1.727 m)    Physical Exam  Constitutional: She is oriented to person, place, and time and well-developed, well-nourished, and in no distress.  HENT:  Head: Normocephalic and atraumatic.  Neck:    Cardiovascular: Normal rate, regular rhythm and normal heart sounds.   No murmur heard. Pulmonary/Chest: Effort normal and breath sounds normal.  Neurological: She is alert and oriented to person, place, and time.  Skin: Rash noted. Rash is macular. Rash is not maculopapular.  Macular erythematous rash over the right lower neck and upper chest wall. maculo-papular pruritic blister-like lesions over the skin of right lateral neck, no fluid visible from the lesions,  Psychiatric: Mood, memory, affect and judgment normal.  Nursing note and vitals reviewed.      Assessment & Plan  1. Irritant contact dermatitis, unspecified trigger Could likely be from dryness versus contact dermatitis, started on Kenalog cream for relief. Advised to apply a liberal application of moisturizing cream to alleviate dryness - triamcinolone cream (KENALOG) 0.1 %; Apply 1 application topically 2 (two) times daily.  Dispense: 30 g; Refill: 0  2. Chronic migraine without aura without status migrainosus, not intractable Refill for Maxalt and Topamax provided - rizatriptan (MAXALT) 10 MG tablet; Take one tablet at the onset of migraine.  May repeat once in 2 hours if needed.  Dispense: 30 tablet; Refill:  2 - topiramate (TOPAMAX) 50 MG tablet; Take 1 tablet (50 mg total) by mouth 2 (two) times daily.  Dispense: 180 tablet; Refill: 0   Akul Leggette [facility] 05/15/2016 3:14 PM

## 2016-07-11 ENCOUNTER — Ambulatory Visit (INDEPENDENT_AMBULATORY_CARE_PROVIDER_SITE_OTHER): Payer: Commercial Managed Care - HMO | Admitting: *Deleted

## 2016-07-11 DIAGNOSIS — N809 Endometriosis, unspecified: Secondary | ICD-10-CM | POA: Diagnosis not present

## 2016-07-11 DIAGNOSIS — Z30013 Encounter for initial prescription of injectable contraceptive: Secondary | ICD-10-CM

## 2016-07-11 DIAGNOSIS — Z3042 Encounter for surveillance of injectable contraceptive: Secondary | ICD-10-CM

## 2016-07-11 MED ORDER — MEDROXYPROGESTERONE ACETATE 150 MG/ML IM SUSP: 150.0000 mg | INTRAMUSCULAR | 3 refills | Status: DC

## 2016-07-11 MED ORDER — MEDROXYPROGESTERONE ACETATE 150 MG/ML IM SUSP
150.0000 mg | INTRAMUSCULAR | Status: DC
  Administered 2016-07-11 – 2017-11-07 (×6): 150 mg via INTRAMUSCULAR

## 2016-07-11 NOTE — Addendum Note (Signed)
Addended by: Ricka Burdock on: 07/11/2016 03:30 PM   Modules accepted: Orders

## 2016-07-11 NOTE — Progress Notes (Signed)
Pt is here today for Depo Provera 150mg , last injection given 04-12-16. Depo given.

## 2016-08-07 ENCOUNTER — Telehealth: Payer: Self-pay | Admitting: Neurology

## 2016-08-07 DIAGNOSIS — G43909 Migraine, unspecified, not intractable, without status migrainosus: Secondary | ICD-10-CM | POA: Diagnosis not present

## 2016-08-07 NOTE — Telephone Encounter (Signed)
Per vo by Dr. Krista Blue, she can come in today for repeat infusion.  She will need to make a follow up appt today.

## 2016-08-07 NOTE — Telephone Encounter (Signed)
Reports migraine over one week.  She has tried and failed home medications repeatedly (Maxalt, Zofran) and is requesting IV treatment.  Her last infusion was on 11/16/15, where she received the following: Solu-Medrol 500mg , Depakote 1000mg , Toradol 30mg , Compazine 10mg .  States the last infusion was helpful in resolving pain.  Her husband is available to drive her, if IV is ordered.  She is aware that she needs to make a follow up appt and will schedule that time today, after checking her work calendar.

## 2016-08-07 NOTE — Telephone Encounter (Signed)
Patient called office in reference to coming in to having an infusion due to migraine that patient has had for over a week.  Please call if no one answers to please leave a message per patient. Patient gets off of work at 2:30 contact number then will be [phone].

## 2016-08-08 ENCOUNTER — Telehealth: Payer: Self-pay | Admitting: Neurology

## 2016-08-08 NOTE — Telephone Encounter (Signed)
Spoke to patient - states she suffered with increased migraines this time last year due to the pollen.  Dr. Krista Blue offered to work her into the schedule for a nerve block but the patient declined stating that a repeat infusion resolved this issue last year.  Per vo by Dr. Krista Blue, ok to repeat the following infusion:  Solu-Medrol 500mg , Depakote 1000mg , Toradol 30mg , Compazine 10mg . Due to her work schedule, she will need to come on 08/09/16 at 3:30pm.  This works well with [facility]'s schedule. Her husband is available to drive her to the appt.

## 2016-08-08 NOTE — Telephone Encounter (Signed)
Pt said infusion did not work for the HA, she is wanting to come in tomorrow about 3:30 for another one. Please call

## 2016-08-10 NOTE — Telephone Encounter (Signed)
Spoke to patient - she did not come in for her infusion appt.  She called [facility] to cancel because her headache started to ease with further use of her home medications.  She will call back with any further concerns.

## 2016-08-16 DIAGNOSIS — G43909 Migraine, unspecified, not intractable, without status migrainosus: Secondary | ICD-10-CM | POA: Diagnosis not present

## 2016-08-16 NOTE — Telephone Encounter (Signed)
She has been added to [facility]'s schedule at 3:30pm today.  Her husband will be driving her to this appt.

## 2016-08-16 NOTE — Telephone Encounter (Signed)
Ok per vo by Dr. Yan for repeat infusion - Solu-Medrol 500mg, Depakote 1000mg, Toradol 30mg, Compazine 10mg.  

## 2016-08-16 NOTE — Telephone Encounter (Signed)
Pt called wanting to schedule an infusion appt for tomorrow at 3:30. She was transferred to infusion dept but did not leave VM and called back,said RN always calls her back.  She can be reached [phone]) [phone]

## 2016-08-31 ENCOUNTER — Other Ambulatory Visit: Payer: Self-pay | Admitting: Family Medicine

## 2016-08-31 DIAGNOSIS — G43709 Chronic migraine without aura, not intractable, without status migrainosus: Secondary | ICD-10-CM

## 2016-09-01 ENCOUNTER — Ambulatory Visit (INDEPENDENT_AMBULATORY_CARE_PROVIDER_SITE_OTHER): Payer: Commercial Managed Care - HMO | Admitting: Family Medicine

## 2016-09-01 ENCOUNTER — Encounter: Payer: Self-pay | Admitting: Family Medicine

## 2016-09-01 DIAGNOSIS — G43709 Chronic migraine without aura, not intractable, without status migrainosus: Secondary | ICD-10-CM | POA: Diagnosis not present

## 2016-09-01 MED ORDER — RIZATRIPTAN BENZOATE 10 MG PO TABS: ORAL_TABLET | ORAL | 2 refills | Status: DC

## 2016-09-01 MED ORDER — TOPIRAMATE 50 MG PO TABS: 50.0000 mg | ORAL_TABLET | Freq: Two times a day (BID) | ORAL | 0 refills | Status: DC

## 2016-09-01 MED ORDER — ONDANSETRON HCL 4 MG PO TABS: 4.0000 mg | ORAL_TABLET | Freq: Three times a day (TID) | ORAL | 0 refills | Status: DC | PRN

## 2016-09-01 NOTE — Progress Notes (Signed)
Name: Anita Weaver   MRN: [id number]    DOB: 07/18/1975   Date:09/01/2016       Progress Note  Subjective  Chief Complaint  Chief Complaint  Patient presents with  . Medication Refill    Migraine   This is a new problem. The current episode started today. The problem has been gradually improving. The pain is located in the frontal region. The pain does not radiate. The pain quality is similar to prior headaches. The quality of the pain is described as sharp. The pain is at a severity of 5/10. Associated symptoms include phonophobia and photophobia. Pertinent negatives include no blurred vision or nausea. The symptoms are aggravated by weather changes. She has tried triptans and antidepressants for the symptoms. The treatment provided moderate relief. Her past medical history is significant for migraine headaches.    Past Medical History:  Diagnosis Date  . Arthritis   . Asthma   . Chronic pelvic pain in female   . Endometriosis   . FH: breast cancer in first degree relative    Mother and sister both are BRCA positive; patient is BRCA negative  . Hyperlipidemia   . Hypertension   . Interstitial cystitis   . Irritable bowel disease   . Neck pain     Past Surgical History:  Procedure Laterality Date  . CHOLECYSTECTOMY    . LAPAROSCOPY  2006  . TUBAL LIGATION      Family History  Problem Relation Age of Onset  . Cancer Mother 32  . Cancer Sister 59    Social History   Social History  . Marital status: Married    Spouse name: N/A  . Number of children: 3  . Years of education: 12   Occupational History  . Not on file.   Social History Main Topics  . Smoking status: Never Smoker  . Smokeless tobacco: Never Used  . Alcohol use No     Comment: occasionally  . Drug use: No  . Sexual activity: Yes    Partners: Male    Birth control/ protection: Surgical     Comment: tubaligation   Other Topics Concern  . Not on file   Social History Narrative   ** Merged  History Encounter **       ** Data from: 02/09/14 Enc Dept: [facility]       ** Data from: 11/10/13 Enc Dept: [facility]   Patient lives at home with [name]   Patient works full time.   Right handed.   Caffeine- none   Education- high school     Current Outpatient Prescriptions:  .  ALPRAZolam (XANAX) 0.5 MG tablet, Take 0.5 mg by mouth at bedtime as needed for anxiety., Disp: , Rfl:  .  DEXILANT 60 MG capsule, TAKE 1 CAPSULE BY MOUTH DAILY, Disp: 30 capsule, Rfl: 6 .  ergocalciferol (VITAMIN D2) 50000 units capsule, Take by mouth., Disp: , Rfl:  .  ibuprofen (ADVIL,MOTRIN) 800 MG tablet, Take 1 tablet (800 mg total) by mouth every 8 (eight) hours as needed., Disp: 30 tablet, Rfl: 2 .  lurasidone (LATUDA) 40 MG TABS tablet, Take 20 mg by mouth daily with breakfast., Disp: , Rfl:  .  medroxyPROGESTERone (DEPO-PROVERA) 150 MG/ML injection, Inject 1 mL (150 mg total) into the muscle every 3 (three) months., Disp: 1 mL, Rfl: 3 .  ondansetron (ZOFRAN) 4 MG tablet, Take 1 tablet (4 mg total) by mouth every 8 (eight) hours as needed  for nausea or vomiting., Disp: 90 tablet, Rfl: 0 .  ranitidine ([facility] RANITIDINE) 75 MG tablet, Take by mouth. Reported on 04/15/2015, Disp: , Rfl:  .  rizatriptan (MAXALT) 10 MG tablet, Take one tablet at the onset of migraine.  May repeat once in 2 hours if needed., Disp: 30 tablet, Rfl: 2 .  topiramate (TOPAMAX) 50 MG tablet, Take 1 tablet (50 mg total) by mouth 2 (two) times daily., Disp: 180 tablet, Rfl: 0 .  triamcinolone cream (KENALOG) 0.1 %, Apply 1 application topically 2 (two) times daily. (Patient not taking: Reported on 09/01/2016), Disp: 30 g, Rfl: 0  Current Facility-Administered Medications:  .  medroxyPROGESTERone (DEPO-PROVERA) injection 150 mg, 150 mg, Intramuscular, Q90 days, Dove, Myra C, MD, 150 mg at 07/11/16 1525  Allergies  Allergen Reactions  . Imitrex [Sumatriptan] Other (See Comments)    Heart palpitations, hot/cold  sweats  . Demerol [Meperidine] Other (See Comments)  . Demerol [Meperidine]   . Flagyl [Metronidazole] Hives     Review of Systems  Eyes: Positive for photophobia. Negative for blurred vision.  Gastrointestinal: Negative for nausea.     Objective  Vitals:   09/01/16 1545  BP: 127/76  Pulse: 100  Resp: 16  Temp: 98.3 F (36.8 C)  TempSrc: Oral  SpO2: 97%  Weight: 204 lb 4.8 oz (92.7 kg)  Height: '5\' 8"'$  (1.727 m)    Physical Exam  Constitutional: She is oriented to person, place, and time and well-developed, well-nourished, and in no distress.  HENT:  Head: Normocephalic and atraumatic.  Mouth/Throat: No posterior oropharyngeal erythema.  Cardiovascular: Normal rate, regular rhythm and normal heart sounds.   No murmur heard. Pulmonary/Chest: Effort normal and breath sounds normal. She has no wheezes.  Neurological: She is alert and oriented to person, place, and time.  Nursing note and vitals reviewed.      Assessment & Plan  1. Chronic migraine without aura without status migrainosus, not intractable Stable and responsive to triptan, takes Zofran as needed for associated nausea. Refills provided - topiramate (TOPAMAX) 50 MG tablet; Take 1 tablet (50 mg total) by mouth 2 (two) times daily.  Dispense: 180 tablet; Refill: 0 - rizatriptan (MAXALT) 10 MG tablet; Take one tablet at the onset of migraine.  May repeat once in 2 hours if needed.  Dispense: 30 tablet; Refill: 2 - ondansetron (ZOFRAN) 4 MG tablet; Take 1 tablet (4 mg total) by mouth every 8 (eight) hours as needed for nausea or vomiting.  Dispense: 90 tablet; Refill: 0  Syed [facility] 09/01/2016 3:53 PM

## 2016-09-18 ENCOUNTER — Encounter: Payer: Self-pay | Admitting: Nurse Practitioner

## 2016-09-18 ENCOUNTER — Ambulatory Visit (INDEPENDENT_AMBULATORY_CARE_PROVIDER_SITE_OTHER): Payer: Commercial Managed Care - HMO | Admitting: Nurse Practitioner

## 2016-09-18 VITALS — BP 124/88 | HR 70 | Wt 209.0 lb

## 2016-09-18 DIAGNOSIS — G43709 Chronic migraine without aura, not intractable, without status migrainosus: Secondary | ICD-10-CM | POA: Diagnosis not present

## 2016-09-18 MED ORDER — TOPIRAMATE 50 MG PO TABS: 50.0000 mg | ORAL_TABLET | Freq: Two times a day (BID) | ORAL | 3 refills | Status: DC

## 2016-09-18 MED ORDER — ONDANSETRON HCL 4 MG PO TABS: 4.0000 mg | ORAL_TABLET | Freq: Three times a day (TID) | ORAL | 0 refills | Status: DC | PRN

## 2016-09-18 MED ORDER — RIZATRIPTAN BENZOATE 10 MG PO TABS: ORAL_TABLET | ORAL | 3 refills | Status: DC

## 2016-09-18 NOTE — Patient Instructions (Addendum)
Continue Topamax as preventive Continue Maxalt acutely will refill Continue Zofran when necessary nausea Call for worsening headaches Follow-up in 1 year

## 2016-09-18 NOTE — Progress Notes (Signed)
[facility]  PATIENT: Anita Weaver DOB: 1975-04-15   REASON FOR VISIT: Follow-up for migraine HISTORY FROM: Patient    HISTORY OF PRESENT ILLNESS:Anita Weaver is a 41 years old right-handed Caucasian female, accompanied by her husband, referred by her primary care physician Dr. Manuella Ghazi for evaluation of migraine headaches  She reported a history of migraine headaches since age 89, at that time, she also has excessive stress, for a while, she been having headaches daily, since her separation with her previous husband, her headache has much improved, doing very well from 2013-2014, but since October 2014, she began to get migraine again, gradually getting worse, to almost daily basis, she has been taking Excedrin Migraine every day 2-4 tablets, her migraine headaches are usually lateralized severe pounding headaches with associated light noise sensitivity, nauseous, lasting for days. Previously she has tried different preventive medications including Topamax, moderate help, nortriptyline, no help, there was an 1 medication prescribed by her psychologist for her bipolar disorder, Depakote has been very helpful for him migraine Over-the-counter Tylenol, ibuprofen and is no longer helpful, Excedrin migraine only provide mild temporary relief, she has tried triptan treatment in the past, Imitrex causing heart racing, sweaty, difficulty breathing, Maxalt has been very helpful, but she was not able afford it because the high co-pay, Flexeril has dade her sleepy Trigger for her migraines, weather change, stress, caffeine, sleep deprivation  She had tubal ligations now, she also complains of worsening depression over the past few months, will set up appointment with her psychologist, she is not not on any medications UPDATE August 3rd 2015:YY  She came in with her husband today, She has tried Depakote ER '500mg'$  qhs for almost 2 months without helping, worry about side effect, Inderal '40mg'$  bid  did not help either.  She still complains of headaches 20 days in a month, Maxalt works well, headache would go away in 30 minutes, she used it up all 15 tabs of Maxalt in one month. She also complains of dizziness, spinning sensation, fall over, slurred speech  "i have a cyst on the back part of my brain" UPDATE June 2nd 2016:YY Last clinical visit was August 2015, MRI of the brain in September 2015 was essentially normal. She is not on any preventive medications now, for a while, her migraines well controlled by Maxalt, but since May 2016, she began to have frequent, almost daily headaches, left retro-orbital area moderate to severe pounding headache with associated light noise sensitivity, she been taking almost daily Maxalt, Which helps her, but only temporarily, She does not want to consider Botox injection as preventive medications, also complains of midline neck pain, achy sensation, no radiating pain, no bilateral upper extremity paresthesia and weakness. Topamax '100mg'$  bid works for her somewhat in the past, wants to be back on it UPDATE 12/12/2016CM Anita Weaver, 41 year old female returns for follow-up. She is previously been seen by Dr. Krista Blue in this office. Her headache today has been ongoing for several weeks it goes away with Maxalt but returns. Her pain level today is 8. She's also had some nausea. Pain is in the left retro-orbital area pounding associated with light sensitivity. She is currently on Topamax as a preventive. She also has Flexeril does not take that very much. She returns for reevaluation. In the last 3 months she has been to urgent care twice for Toradol injections. She wakes with some of her headaches in the morning UPDATE 06/11/2018CM Anita Weaver, 41 year old female returns for follow-up with last office  visit being December 2016. She has been seen in the infusion area several times for IV migraine cocktail. She has a history of migraine headaches with associated symptoms of  photophobia and phonophobia and nausea. Her symptoms can be aggravated by weather changes She is currently on Topamax 50 mg twice daily Maxalt acutely. She has Zofran for nausea. She has not kept a record of her headaches however she states she has more headaches in the summer months. Her migraine triggers are caffeine and artificial sweeteners. She returns for reevaluation   REVIEW OF SYSTEMS: Full 14 system review of systems performed and notable only for those listed, all others are neg:  Constitutional: neg  Cardiovascular: neg Ear/Nose/Throat: neg  Skin: neg Eyes: neg Respiratory: neg Gastroitestinal: neg Hematology/Lymphatic: neg  Endocrine: neg Musculoskeletal: neg Stiffness Allergy/Immunology: neg Neurological: Headache Psychiatric: neg Sleep : neg   ALLERGIES: Allergies  Allergen Reactions  . Imitrex [Sumatriptan] Other (See Comments)    Heart palpitations, hot/cold sweats  . Demerol [Meperidine] Other (See Comments)  . Demerol [Meperidine]   . Flagyl [Metronidazole] Hives    HOME MEDICATIONS: Outpatient Medications Prior to Visit  Medication Sig Dispense Refill  . ALPRAZolam (XANAX) 0.5 MG tablet Take 0.5 mg by mouth at bedtime as needed for anxiety.    . DEXILANT 60 MG capsule TAKE 1 CAPSULE BY MOUTH DAILY 30 capsule 6  . ergocalciferol (VITAMIN D2) 50000 units capsule Take by mouth.    Marland Kitchen ibuprofen (ADVIL,MOTRIN) 800 MG tablet Take 1 tablet (800 mg total) by mouth every 8 (eight) hours as needed. 30 tablet 2  . lurasidone (LATUDA) 40 MG TABS tablet Take 20 mg by mouth daily with breakfast.    . medroxyPROGESTERone (DEPO-PROVERA) 150 MG/ML injection Inject 1 mL (150 mg total) into the muscle every 3 (three) months. 1 mL 3  . ondansetron (ZOFRAN) 4 MG tablet Take 1 tablet (4 mg total) by mouth every 8 (eight) hours as needed for nausea or vomiting. 90 tablet 0  . ranitidine ([facility] RANITIDINE) 75 MG tablet Take by mouth. Reported on 04/15/2015    . rizatriptan (MAXALT)  10 MG tablet Take one tablet at the onset of migraine.  May repeat once in 2 hours if needed. 30 tablet 2  . topiramate (TOPAMAX) 50 MG tablet Take 1 tablet (50 mg total) by mouth 2 (two) times daily. 180 tablet 0  . triamcinolone cream (KENALOG) 0.1 % Apply 1 application topically 2 (two) times daily. 30 g 0   Facility-Administered Medications Prior to Visit  Medication Dose Route Frequency Provider Last Rate Last Dose  . medroxyPROGESTERone (DEPO-PROVERA) injection 150 mg  150 mg Intramuscular Q90 days Clovia Cuff C, MD   150 mg at 07/11/16 1525    PAST MEDICAL HISTORY: Past Medical History:  Diagnosis Date  . Arthritis   . Asthma   . Chronic pelvic pain in female   . Endometriosis   . FH: breast cancer in first degree relative    Mother and sister both are BRCA positive; patient is BRCA negative  . Headache    migraines  . Hyperlipidemia   . Hypertension   . Interstitial cystitis   . Irritable bowel disease   . Neck pain     PAST SURGICAL HISTORY: Past Surgical History:  Procedure Laterality Date  . CHOLECYSTECTOMY    . LAPAROSCOPY  2006  . TUBAL LIGATION      FAMILY HISTORY: Family History  Problem Relation Age of Onset  . Cancer Mother 50  .  Cancer Sister 60    SOCIAL HISTORY: Social History   Social History  . Marital status: Married    Spouse name: [name]  . Number of children: 3  . 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 Smoking status: Never Smoker  . Smokeless tobacco: Never Used  . Alcohol use No     Comment: occasionally  . Drug use: No  . Sexual activity: Yes    Partners: Male    Birth control/ protection: Surgical     Comment: tubaligation   Other Topics Concern  . Not on file   Social History Narrative   ** Merged History Encounter **       ** Data from: 02/09/14 Enc Dept: [facility]       ** Data from: 11/10/13 Enc Dept: [facility]   Patient lives at home with  [name]   Patient works full time.   Right handed.   Caffeine- none   Education- high school     PHYSICAL EXAM  Vitals:   09/18/16 1558  BP: 124/88  Pulse: 70  Weight: 209 lb (94.8 kg)   Body mass index is 31.78 kg/m.  Generalized: Well developed, in no acute distress  Head: normocephalic and atraumatic,. Oropharynx benign  Neck: Supple,  Musculoskeletal: No deformity   Neurological examination   Mentation: Alert oriented to time, place, history taking. Attention span and concentration appropriate. Recent and remote memory intact.  Follows all commands speech and language fluent.  Cranial nerve II-XII: .Pupils were equal round reactive to light extraocular movements were full, visual field were full on confrontational test. Facial sensation and strength were normal. hearing was intact to finger rubbing bilaterally. Uvula tongue midline. Head turning and shoulder shrug were normal and symmetric.Tongue protrusion into cheek strength was normal. Motor: normal bulk and tone, full strength in the BUE, BLE, fine finger movements normal, no pronator drift. No focal weakness Sensory: normal and symmetric to light touch, pinprick, and  Vibration, in the upper and lower extremities Coordination: finger-nose-finger, heel-to-shin bilaterally, no dysmetria Reflexes: Symmetric upper and lower plantar responses were flexor bilaterally. Gait and Station: Rising up from seated position without assistance, normal stance,  moderate stride, good arm swing, smooth turning, able to perform tiptoe, and heel walking without difficulty. Tandem gait is steady  DIAGNOSTIC DATA (LABS, IMAGING, TESTING)   ASSESSMENT AND PLAN  41 y.o. year old female  has a past medical history of Asthma; Arthritis; Hyperlipidemia; Hypertension; Neck pain; and migraine headache here to follow-up for her migraine headaches She previously tried and failed Depakote ER 500 mg, Inderal, nortriptyline,Lamicta,  magnesium oxide 400 mg twice a day, riboflavin 100 mg twice a day  PLAN: Continue Topamax as preventive 50 mg twice daily will refill Continue Maxalt acutely will refill Continue Zofran for nausea Call for worsening of headaches Follow-up in 1 year Dennie Bible, [facility], [facility], [facility] [address] [place], [place] [address] [phone]

## 2016-09-19 NOTE — Progress Notes (Signed)
Z[facility]fran rx [facility]ed to [facility].

## 2016-09-22 NOTE — Progress Notes (Signed)
I have reviewed and agreed above plan. 

## 2016-09-27 ENCOUNTER — Ambulatory Visit (INDEPENDENT_AMBULATORY_CARE_PROVIDER_SITE_OTHER): Payer: 59 | Admitting: *Deleted

## 2016-09-27 DIAGNOSIS — N809 Endometriosis, unspecified: Secondary | ICD-10-CM | POA: Diagnosis not present

## 2016-10-02 ENCOUNTER — Other Ambulatory Visit: Payer: Self-pay | Admitting: Obstetrics & Gynecology

## 2016-10-02 DIAGNOSIS — Z30013 Encounter for initial prescription of injectable contraceptive: Secondary | ICD-10-CM

## 2016-10-09 NOTE — Telephone Encounter (Signed)
Refill sent to pharmacy.   

## 2016-11-28 ENCOUNTER — Other Ambulatory Visit: Payer: Self-pay | Admitting: Gastroenterology

## 2016-12-05 DIAGNOSIS — N643 Galactorrhea not associated with childbirth: Secondary | ICD-10-CM | POA: Diagnosis not present

## 2016-12-05 DIAGNOSIS — N870 Mild cervical dysplasia: Secondary | ICD-10-CM | POA: Diagnosis not present

## 2016-12-05 DIAGNOSIS — Z01419 Encounter for gynecological examination (general) (routine) without abnormal findings: Secondary | ICD-10-CM | POA: Diagnosis not present

## 2016-12-05 LAB — HM PAP SMEAR: HM Pap smear: NEGATIVE

## 2016-12-13 ENCOUNTER — Ambulatory Visit (INDEPENDENT_AMBULATORY_CARE_PROVIDER_SITE_OTHER): Payer: 59 | Admitting: *Deleted

## 2016-12-13 DIAGNOSIS — N809 Endometriosis, unspecified: Secondary | ICD-10-CM | POA: Diagnosis not present

## 2016-12-13 DIAGNOSIS — Z3042 Encounter for surveillance of injectable contraceptive: Secondary | ICD-10-CM

## 2016-12-18 ENCOUNTER — Telehealth: Payer: Self-pay | Admitting: Neurology

## 2016-12-18 NOTE — Telephone Encounter (Signed)
Ok per vo by Dr. Krista Blue for repeat infusion - Solu-Medrol 500mg , Depakote 1000mg , Toradol 30mg , Compazine 10mg .

## 2016-12-18 NOTE — Telephone Encounter (Signed)
Pt calling stating she is urgently in need of an infusion.  She states she has left messages that have not been responded to she is asking to be called on her work# until 2 pm and if after then call her mobile re: the urgent need of an infusion

## 2016-12-18 NOTE — Telephone Encounter (Signed)
Spoke to patient - she would like to come in on 12/19/16 for her infusion.  She can be here at 3:30pm.  [facility] is able to treat her at this time.

## 2016-12-19 DIAGNOSIS — G43909 Migraine, unspecified, not intractable, without status migrainosus: Secondary | ICD-10-CM | POA: Diagnosis not present

## 2016-12-20 NOTE — Telephone Encounter (Signed)
Per vo by Dr. Krista Blue, may repeat infusion with the following medications:  Depakote 1000mg , Toradol 30mg , Compazine 10mg   (Dr. Krista Blue does not want to repeat the Solu-Medrol)  Patient aware and agreeable to this plan.  Otila Kluver in [place] has advised that she can be here at 3:30pm.

## 2016-12-20 NOTE — Telephone Encounter (Signed)
Pt called in request order for infusion tomorrow afternoon please. Please call to discuss

## 2016-12-21 DIAGNOSIS — G43909 Migraine, unspecified, not intractable, without status migrainosus: Secondary | ICD-10-CM | POA: Diagnosis not present

## 2016-12-26 ENCOUNTER — Encounter: Payer: Self-pay | Admitting: Family Medicine

## 2016-12-26 ENCOUNTER — Ambulatory Visit (INDEPENDENT_AMBULATORY_CARE_PROVIDER_SITE_OTHER): Payer: 59 | Admitting: Family Medicine

## 2016-12-26 VITALS — BP 128/73 | HR 95 | Temp 98.7°F | Resp 16 | Ht 68.0 in | Wt 203.0 lb

## 2016-12-26 DIAGNOSIS — G43709 Chronic migraine without aura, not intractable, without status migrainosus: Secondary | ICD-10-CM

## 2016-12-26 DIAGNOSIS — M79622 Pain in left upper arm: Secondary | ICD-10-CM | POA: Diagnosis not present

## 2016-12-26 MED ORDER — RIZATRIPTAN BENZOATE 10 MG PO TABS: ORAL_TABLET | ORAL | 2 refills | Status: DC

## 2016-12-26 NOTE — Progress Notes (Signed)
Name: Anita Weaver   MRN: [id number]    DOB: Dec 07, 1975   Date:12/26/2016       Progress Note  Subjective  Chief Complaint  Chief Complaint  Patient presents with  . Medication Refill    Migraine   This is a recurrent problem. Episode frequency: migraines on average 2-3 times a week. The pain is located in the frontal [facility] over one or the other eye) region. Pain scale: usually takes the medication at the onset. The pain is mild. Associated symptoms include nausea, phonophobia and photophobia. Pertinent negatives include no blurred vision, eye redness, fever, vomiting or weakness. The symptoms are aggravated by food (caffeine brings ont he migraine). She has tried triptans and ketorolac injections for the symptoms. The treatment provided significant relief.   Patient also requests evaluation of pain in her left upper arm proximal to the left elbow joint. She reports it has been painful for 'months', lifting the arm horizontally and trying to rotate the arm in a circle makes it hurt,  np history of trauma or repetitive motion. She has not taken any medication for relief.   Past Medical History:  Diagnosis Date  . Arthritis   . Asthma   . Chronic pelvic pain in female   . Endometriosis   . FH: breast cancer in first degree relative    Mother and sister both are BRCA positive; patient is BRCA negative  . Headache    migraines  . Hyperlipidemia   . Hypertension   . Interstitial cystitis   . Irritable bowel disease   . Neck pain     Past Surgical History:  Procedure Laterality Date  . CHOLECYSTECTOMY    . LAPAROSCOPY  2006  . TUBAL LIGATION      Family History  Problem Relation Age of Onset  . Cancer Mother 68  . Cancer Sister 41    Social History   Social History  . Marital status: Married    Spouse name: Anita Weaver  . Number of children: 3  . 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 Smoking status: Never  Smoker  . Smokeless tobacco: Never Used  . Alcohol use No     Comment: occasionally  . Drug use: No  . Sexual activity: Yes    Partners: Male    Birth control/ protection: Surgical     Comment: tubaligation   Other Topics Concern  . Not on file   Social History Narrative   ** Merged History Encounter **       ** Data from: 02/09/14 Enc Dept: [facility]       ** Data from: 11/10/13 Enc Dept: [facility]   Patient lives at home with her husband Anita Weaver)   Patient works full time.   Right handed.   Caffeine- none   Education- high school     Current Outpatient Prescriptions:  .  ALPRAZolam (XANAX) 0.5 MG tablet, Take 0.5 mg by mouth at bedtime as needed for anxiety., Disp: , Rfl:  .  DEXILANT 60 MG capsule, TAKE 1 CAPSULE BY MOUTH DAILY, Disp: 30 capsule, Rfl: 11 .  ergocalciferol (VITAMIN D2) 50000 units capsule, Take by mouth., Disp: , Rfl:  .  ibuprofen (ADVIL,MOTRIN) 800 MG tablet, Take 1 tablet (800 mg total) by mouth every 8 (eight) hours as needed., Disp: 30 tablet, Rfl: 2 .  lurasidone (LATUDA) 40 MG TABS tablet,  Take 20 mg by mouth daily with breakfast., Disp: , Rfl:  .  medroxyPROGESTERone (DEPO-PROVERA) 150 MG/ML injection, Inject 1 mL (150 mg total) into the muscle every 3 (three) months., Disp: 1 mL, Rfl: 3 .  MedroxyPROGESTERone Acetate 150 MG/ML SUSY, INJECT 1 ML (150 MG TOTAL) INTO THE MUSCLE EVERY 3 (THREE) MONTHS., Disp: 1 Syringe, Rfl: 3 .  ondansetron (ZOFRAN) 4 MG tablet, Take 1 tablet (4 mg total) by mouth every 8 (eight) hours as needed for nausea or vomiting., Disp: 90 tablet, Rfl: 0 .  ranitidine ([facility] RANITIDINE) 75 MG tablet, Take by mouth. Reported on 04/15/2015, Disp: , Rfl:  .  rizatriptan (MAXALT) 10 MG tablet, Take one tablet at the onset of migraine.  May repeat once in 2 hours if needed., Disp: 30 tablet, Rfl: 3 .  topiramate (TOPAMAX) 50 MG tablet, Take 1 tablet (50 mg total) by mouth 2 (two) times daily., Disp: 180 tablet, Rfl: 3 .   triamcinolone cream (KENALOG) 0.1 %, Apply 1 application topically 2 (two) times daily., Disp: 30 g, Rfl: 0  Current Facility-Administered Medications:  .  medroxyPROGESTERone (DEPO-PROVERA) injection 150 mg, 150 mg, Intramuscular, Q90 days, Dove, Myra C, MD, 150 mg at 12/13/16 1532  Allergies  Allergen Reactions  . Imitrex [Sumatriptan] Other (See Comments)    Heart palpitations, hot/cold sweats  . Demerol [Meperidine] Other (See Comments)  . Demerol [Meperidine]   . Flagyl [Metronidazole] Hives     Review of Systems  Constitutional: Negative for fever.  Eyes: Positive for photophobia. Negative for blurred vision and redness.  Gastrointestinal: Positive for nausea. Negative for vomiting.  Neurological: Negative for weakness.     Objective  Vitals:   12/26/16 1501  BP: 128/73  Pulse: 95  Resp: 16  Temp: 98.7 F (37.1 C)  TempSrc: Oral  SpO2: 97%  Weight: 203 lb (92.1 kg)  Height: 5' 8" (1.727 m)    Physical Exam  Constitutional: She is oriented to person, place, and time and well-developed, well-nourished, and in no distress.  HENT:  Head: Normocephalic and atraumatic.  Cardiovascular: Normal rate, regular rhythm and normal heart sounds.   No murmur heard. Pulmonary/Chest: Effort normal and breath sounds normal. She has no wheezes.  Musculoskeletal:       Left upper arm: She exhibits tenderness. She exhibits no swelling and no edema.       Arms: Neurological: She is alert and oriented to person, place, and time.  Psychiatric: Mood, memory, affect and judgment normal.  Nursing note and vitals reviewed.        Assessment & Plan  1. Chronic migraine without aura without status migrainosus, not intractable Refill provided for Maxalt, advised to continue follow-up with neurology - rizatriptan (MAXALT) 10 MG tablet; Take one tablet at the onset of migraine.  May repeat once in 2 hours if needed.  Dispense: 30 tablet; Refill: 2  2. Pain in left upper  arm Likely musculoskeletal in nature, advised to use a heating pad, may take Tylenol. If persistent, will proceed with imaging   [name] [facility] 12/26/2016 3:08 PM

## 2016-12-27 ENCOUNTER — Ambulatory Visit: Payer: Commercial Managed Care - HMO | Admitting: Family Medicine

## 2016-12-28 ENCOUNTER — Telehealth: Payer: Self-pay | Admitting: Family Medicine

## 2016-12-28 DIAGNOSIS — G43709 Chronic migraine without aura, not intractable, without status migrainosus: Secondary | ICD-10-CM

## 2016-12-28 NOTE — Telephone Encounter (Signed)
Was seen on this past Tuesday states that she is needing a refill on topamax. Only have 1 refill left and is asking that you send to [facility]. Please return call to discuss further [phone]

## 2016-12-29 MED ORDER — TOPIRAMATE 50 MG PO TABS: 50.0000 mg | ORAL_TABLET | Freq: Two times a day (BID) | ORAL | 0 refills | Status: DC

## 2016-12-29 NOTE — Telephone Encounter (Signed)
Scription for Topamax 50 mg twice a day has been sent to patient's pharmacy. I tried to contact patient and left a voice message on her machine.

## 2016-12-29 NOTE — Telephone Encounter (Signed)
Dr. Manuella Ghazi, pt was seen on this past Tuesday states that she is needing a refill on topamax. Only have 1 refill left and is asking that you send to [facility]. Please return call to discuss further [phone]

## 2017-01-04 ENCOUNTER — Telehealth: Payer: Self-pay | Admitting: Family Medicine

## 2017-01-04 NOTE — Telephone Encounter (Signed)
Pt states she needs a 2 mth supply refill on Topamax to be sent to [facility]. Pt states pharmacy has told her she has no refills.

## 2017-01-05 NOTE — Telephone Encounter (Addendum)
Prescription for Topamax 50 mg twice daily has been sent to pharmacy [facility] for 3 months supply on 12/28/2016

## 2017-01-18 ENCOUNTER — Other Ambulatory Visit: Payer: Self-pay | Admitting: Family Medicine

## 2017-01-18 DIAGNOSIS — Z1231 Encounter for screening mammogram for malignant neoplasm of breast: Secondary | ICD-10-CM

## 2017-02-01 ENCOUNTER — Ambulatory Visit
Admission: RE | Admit: 2017-02-01 | Discharge: 2017-02-01 | Disposition: A | Discharge status: Home / Self Care | Payer: 59 | Source: Ambulatory Visit | Attending: Family Medicine | Admitting: Family Medicine

## 2017-02-01 DIAGNOSIS — Z1231 Encounter for screening mammogram for malignant neoplasm of breast: Secondary | ICD-10-CM

## 2017-02-02 ENCOUNTER — Other Ambulatory Visit: Payer: Self-pay | Admitting: Family Medicine

## 2017-02-02 DIAGNOSIS — N6320 Unspecified lump in the left breast, unspecified quadrant: Secondary | ICD-10-CM

## 2017-02-02 DIAGNOSIS — R928 Other abnormal and inconclusive findings on diagnostic imaging of breast: Secondary | ICD-10-CM

## 2017-02-05 ENCOUNTER — Telehealth: Payer: Self-pay

## 2017-02-05 ENCOUNTER — Telehealth: Payer: Self-pay | Admitting: Family Medicine

## 2017-02-05 NOTE — Telephone Encounter (Signed)
Left a message to have the patient call regarding her mammogram results. Asked pt to give me a call back to go over the results.

## 2017-02-05 NOTE — Telephone Encounter (Signed)
-----   Message from Roselee Nova, MD sent at 02/02/2017  2:44 PM EDT ----- Mammogram shows a possible mass in the left breast, which warrants further evaluation. It is recommended she has diagnostic mammogram and possibly ultrasound of the left breast. Please confirm

## 2017-02-05 NOTE — Telephone Encounter (Signed)
Went over pt mammogram results with her. Pt agreed to further testing.

## 2017-02-05 NOTE — Telephone Encounter (Signed)
Copied from [place] #[id number]. Topic: Quick Communication - See Telephone Encounter >> Feb 05, 2017  2:49 PM Cecelia Byars, NT wrote: CRM for notification. See Telephone encounter for:  02/05/17. Patient returning Anita Weaver ,call she is concerned about results

## 2017-02-08 ENCOUNTER — Ambulatory Visit
Admission: RE | Admit: 2017-02-08 | Discharge: 2017-02-08 | Disposition: A | Discharge status: Home / Self Care | Payer: 59 | Source: Ambulatory Visit | Attending: Family Medicine | Admitting: Family Medicine

## 2017-02-08 ENCOUNTER — Other Ambulatory Visit: Payer: Self-pay | Admitting: Family Medicine

## 2017-02-08 DIAGNOSIS — R922 Inconclusive mammogram: Secondary | ICD-10-CM | POA: Diagnosis not present

## 2017-02-08 DIAGNOSIS — N6320 Unspecified lump in the left breast, unspecified quadrant: Secondary | ICD-10-CM

## 2017-02-08 DIAGNOSIS — R928 Other abnormal and inconclusive findings on diagnostic imaging of breast: Secondary | ICD-10-CM

## 2017-02-08 DIAGNOSIS — N6321 Unspecified lump in the left breast, upper outer quadrant: Secondary | ICD-10-CM | POA: Insufficient documentation

## 2017-02-08 DIAGNOSIS — N6489 Other specified disorders of breast: Secondary | ICD-10-CM | POA: Diagnosis not present

## 2017-02-08 HISTORY — PX: BREAST BIOPSY: SHX20

## 2017-02-09 ENCOUNTER — Telehealth: Payer: Self-pay

## 2017-02-09 NOTE — Telephone Encounter (Signed)
Pt called back asking that the office call work number if calling before 230,  if calling back after 230 call her mobile number.

## 2017-02-09 NOTE — Telephone Encounter (Signed)
Left a message to give me a call back regarding her mammogram and [place] results.

## 2017-02-13 ENCOUNTER — Telehealth: Payer: Self-pay

## 2017-02-13 NOTE — Telephone Encounter (Signed)
-----   Message from Roselee Nova, MD sent at 02/08/2017  5:54 PM EDT ----- Diagnostic left breast mammogram and ultrasound of the left breast shows a slightly hypoechoic oval mass with a well-defined mass measuring 10 x 6 x 10 mm, approximately 6 cm from the left nipple. No associated vascular flow is identified. Ultrasound of left axilla is negative for lymphadenopathy. This could likely be a benign mass such as a fibroadenoma, however malignancy cannot be excluded and for this reason ultrasound-guided biopsy is recommended

## 2017-02-13 NOTE — Telephone Encounter (Signed)
Pt.notified

## 2017-02-15 ENCOUNTER — Ambulatory Visit
Admission: RE | Admit: 2017-02-15 | Discharge: 2017-02-15 | Disposition: A | Discharge status: Home / Self Care | Payer: 59 | Source: Ambulatory Visit | Attending: Family Medicine | Admitting: Family Medicine

## 2017-02-15 DIAGNOSIS — D242 Benign neoplasm of left breast: Secondary | ICD-10-CM | POA: Diagnosis not present

## 2017-02-15 DIAGNOSIS — R928 Other abnormal and inconclusive findings on diagnostic imaging of breast: Secondary | ICD-10-CM

## 2017-02-15 DIAGNOSIS — N6320 Unspecified lump in the left breast, unspecified quadrant: Secondary | ICD-10-CM

## 2017-02-15 DIAGNOSIS — N62 Hypertrophy of breast: Secondary | ICD-10-CM | POA: Diagnosis not present

## 2017-02-15 DIAGNOSIS — Z803 Family history of malignant neoplasm of breast: Secondary | ICD-10-CM | POA: Insufficient documentation

## 2017-02-15 DIAGNOSIS — N6323 Unspecified lump in the left breast, lower outer quadrant: Secondary | ICD-10-CM | POA: Diagnosis not present

## 2017-02-15 DIAGNOSIS — N6321 Unspecified lump in the left breast, upper outer quadrant: Secondary | ICD-10-CM | POA: Diagnosis not present

## 2017-02-16 LAB — SURGICAL PATHOLOGY

## 2017-03-20 ENCOUNTER — Ambulatory Visit (INDEPENDENT_AMBULATORY_CARE_PROVIDER_SITE_OTHER): Payer: 59

## 2017-03-20 DIAGNOSIS — Z3042 Encounter for surveillance of injectable contraceptive: Secondary | ICD-10-CM

## 2017-03-20 DIAGNOSIS — Z3202 Encounter for pregnancy test, result negative: Secondary | ICD-10-CM | POA: Diagnosis not present

## 2017-03-20 LAB — POCT URINE PREGNANCY: Preg Test, Ur: NEGATIVE

## 2017-03-20 NOTE — Progress Notes (Signed)
Patient presented to the office today for upt. Patient is a couple days late for injection. Pregnancy test was negative and patient received her depo injection in her left arm.

## 2017-03-21 ENCOUNTER — Telehealth: Payer: Self-pay | Admitting: Neurology

## 2017-03-21 DIAGNOSIS — G43909 Migraine, unspecified, not intractable, without status migrainosus: Secondary | ICD-10-CM | POA: Diagnosis not present

## 2017-03-21 NOTE — Telephone Encounter (Signed)
Per vo by Dr. Krista Blue, may repeat infusion with the following medications:  Depakote 1000mg , Toradol 30mg , Compazine 10mg   She will arrive to our office today at 1:30pm for her infusion.  Her husband will be driving her to our office.  Signed orders provided to [facility].

## 2017-03-21 NOTE — Telephone Encounter (Signed)
Patient's husband calling requesting patient to have an infusion. She has had a migraine for 3 weeks and rizatriptan (MAXALT) 10 MG tablet has not helped.

## 2017-03-26 DIAGNOSIS — G43909 Migraine, unspecified, not intractable, without status migrainosus: Secondary | ICD-10-CM | POA: Diagnosis not present

## 2017-04-11 ENCOUNTER — Telehealth: Payer: Self-pay | Admitting: Nurse Practitioner

## 2017-04-11 DIAGNOSIS — G43709 Chronic migraine without aura, not intractable, without status migrainosus: Secondary | ICD-10-CM

## 2017-04-11 MED ORDER — TOPIRAMATE 50 MG PO TABS: 100.0000 mg | ORAL_TABLET | Freq: Two times a day (BID) | ORAL | 1 refills | Status: DC

## 2017-04-11 NOTE — Telephone Encounter (Signed)
Pt calling for Dr Krista Blue to be made aware that the preventative medication is not working, she is having to take her rizatriptan (MAXALT) 10 MG tablet every day.  Pt is asking to be called.  Pt states if the call is before 2:30 please call her at work, if the call is after 2:30 please call her mobile

## 2017-04-11 NOTE — Telephone Encounter (Signed)
Dr. Krista Blue has reviewed her chart and per vo patient should increase her topiramate to 100mg , BID.  Spoke to patient - she tolerates topiramate well and is agreeable to this increase.  New rx sent to pharmacy.

## 2017-04-11 NOTE — Addendum Note (Signed)
Addended by: Noberto Retort C on: 04/11/2017 09:56 AM   Modules accepted: Orders

## 2017-04-12 ENCOUNTER — Other Ambulatory Visit: Payer: Self-pay | Admitting: *Deleted

## 2017-04-12 ENCOUNTER — Telehealth: Payer: Self-pay | Admitting: Neurology

## 2017-04-12 DIAGNOSIS — G43709 Chronic migraine without aura, not intractable, without status migrainosus: Secondary | ICD-10-CM

## 2017-04-12 MED ORDER — TOPIRAMATE 50 MG PO TABS: 100.0000 mg | ORAL_TABLET | Freq: Two times a day (BID) | ORAL | 0 refills | Status: DC

## 2017-04-12 NOTE — Telephone Encounter (Signed)
[facility] [place]  She is aware the rx has been sent.

## 2017-04-12 NOTE — Telephone Encounter (Signed)
Pt is wanting a call regarding topiramate (TOPAMAX) 50 MG tablet

## 2017-04-18 ENCOUNTER — Telehealth: Payer: Self-pay | Admitting: Neurology

## 2017-04-18 DIAGNOSIS — G43709 Chronic migraine without aura, not intractable, without status migrainosus: Secondary | ICD-10-CM

## 2017-04-18 MED ORDER — RIZATRIPTAN BENZOATE 10 MG PO TABS: ORAL_TABLET | ORAL | 5 refills | Status: DC

## 2017-04-18 MED ORDER — MECLIZINE HCL 25 MG PO TABS: 25.0000 mg | ORAL_TABLET | Freq: Three times a day (TID) | ORAL | 0 refills | Status: DC | PRN

## 2017-04-18 NOTE — Telephone Encounter (Addendum)
Patient is still experiencing an acute migraine that is unresponsive to rizatriptan and would like to come in for an infusion.  States she had been drinking caffeine that caused this migraine and she has since stopped use of all caffeine.  She also tells me that she has been using rizatriptan daily.    Per vo by Dr. Krista Blue: 1) pt should not use more than #15 rizatriptan per month (new rx sent to pharmacy with these instructions and all other refills voided). 2) she should continue taking topiramate and if her migraine frequency does not improve in one month, she needs to come in for an earlier appointment 3) she may come in, with a driver, for the following IV:      Depakote 1000mg , Toradol 30mg , Phenergan 12.5mg  4) Dr. Krista Blue will allow the IV to be repeated, if needed, on the following day 5) meclizine 25mg  provided for #30 x 0  She will arrive to our office on 04/19/17 for her infusion.  Her husband will be driving her to our office.  Signed orders provided to [facility].

## 2017-04-18 NOTE — Telephone Encounter (Signed)
Pt is wanting RN to call her to discuss a medication she is wanting to be put on. For vertigo pt couldn't remember the name of it if its after 2:30 please call at [phone]

## 2017-04-19 ENCOUNTER — Encounter: Payer: Self-pay | Admitting: Neurology

## 2017-04-19 DIAGNOSIS — G43909 Migraine, unspecified, not intractable, without status migrainosus: Secondary | ICD-10-CM | POA: Diagnosis not present

## 2017-04-23 DIAGNOSIS — G43909 Migraine, unspecified, not intractable, without status migrainosus: Secondary | ICD-10-CM | POA: Diagnosis not present

## 2017-04-27 ENCOUNTER — Encounter: Payer: Self-pay | Admitting: Family Medicine

## 2017-04-27 ENCOUNTER — Ambulatory Visit: Payer: 59 | Admitting: Family Medicine

## 2017-04-27 DIAGNOSIS — K449 Diaphragmatic hernia without obstruction or gangrene: Secondary | ICD-10-CM | POA: Diagnosis not present

## 2017-04-27 DIAGNOSIS — K219 Gastro-esophageal reflux disease without esophagitis: Secondary | ICD-10-CM | POA: Insufficient documentation

## 2017-04-27 DIAGNOSIS — G43909 Migraine, unspecified, not intractable, without status migrainosus: Secondary | ICD-10-CM | POA: Diagnosis not present

## 2017-04-27 DIAGNOSIS — J4520 Mild intermittent asthma, uncomplicated: Secondary | ICD-10-CM | POA: Diagnosis not present

## 2017-04-27 DIAGNOSIS — J45909 Unspecified asthma, uncomplicated: Secondary | ICD-10-CM | POA: Insufficient documentation

## 2017-04-27 DIAGNOSIS — F3175 Bipolar disorder, in partial remission, most recent episode depressed: Secondary | ICD-10-CM | POA: Diagnosis not present

## 2017-04-27 MED ORDER — ALBUTEROL SULFATE HFA 108 (90 BASE) MCG/ACT IN AERS: 2.0000 | INHALATION_SPRAY | RESPIRATORY_TRACT | 2 refills | Status: DC | PRN

## 2017-04-27 NOTE — Patient Instructions (Addendum)
Try to follow the DASH guidelines (DASH stands for Dietary Approaches to Stop Hypertension). Try to limit the sodium in your diet to no more than 1,500mg  of sodium per day. Certainly try to not exceed 2,000 mg per day at the very most. Do not add salt when cooking or at the table.  Check the sodium amount on labels when shopping, and choose items lower in sodium when given a choice. Avoid or limit foods that already contain a lot of sodium. Eat a diet rich in fruits and vegetables and whole grains, and try to lose weight if overweight or obese  Caution: prolonged use of proton pump inhibitors like omeprazole (Prilosec), pantoprazole (Protonix), esomeprazole (Nexium), and others like Dexilant and Aciphex may increase your risk of pneumonia, Clostridium difficile colitis, osteoporosis, anemia and other health complications Try to limit or avoid triggers like coffee, caffeinated beverages, onions, chocolate, spicy foods, peppermint, acidic foods like pizza, spaghetti sauce, and orange juice Lose weight if you are overweight or obese Try elevating the head of your bed by placing a small wedge between your mattress and box springs to keep acid in the stomach at night instead of coming up into your esophagus  Check on the Latuda dose please and let us know  I do recommend yearly flu shots; for individuals who don't want flu shots, try to practice excellent hand hygiene, and avoid nursing homes, day cares, and hospitals during peak flu season; taking additional vitamin C daily during flu/cold season may help boost your immune system too I would recommend a PPSV-23 pneumonia vaccine, so please let us know if you change your mind   DASH Eating Plan DASH stands for "Dietary Approaches to Stop Hypertension." The DASH eating plan is a healthy eating plan that has been shown to reduce high blood pressure (hypertension). It may also reduce your risk for type 2 diabetes, heart disease, and stroke. The DASH eating  plan may also help with weight loss. What are tips for following this plan? General guidelines  Avoid eating more than 2,300 mg (milligrams) of salt (sodium) a day. If you have hypertension, you may need to reduce your sodium intake to 1,500 mg a day.  Limit alcohol intake to no more than 1 drink a day for nonpregnant women and 2 drinks a day for men. One drink equals 12 oz of beer, 5 oz of wine, or 1 oz of hard liquor.  Work with your health care provider to maintain a healthy body weight or to lose weight. Ask what an ideal weight is for you.  Get at least 30 minutes of exercise that causes your heart to beat faster (aerobic exercise) most days of the week. Activities may include walking, swimming, or biking.  Work with your health care provider or diet and nutrition specialist (dietitian) to adjust your eating plan to your individual calorie needs. Reading food labels  Check food labels for the amount of sodium per serving. Choose foods with less than 5 percent of the Daily Value of sodium. Generally, foods with less than 300 mg of sodium per serving fit into this eating plan.  To find whole grains, look for the word "whole" as the first word in the ingredient list. Shopping  Buy products labeled as "low-sodium" or "no salt added."  Buy fresh foods. Avoid canned foods and premade or frozen meals. Cooking  Avoid adding salt when cooking. Use salt-free seasonings or herbs instead of table salt or sea salt. Check with your health care  provider or pharmacist before using salt substitutes.  Do not fry foods. Cook foods using healthy methods such as baking, boiling, grilling, and broiling instead.  Cook with heart-healthy oils, such as olive, canola, soybean, or sunflower oil. Meal planning   Eat a balanced diet that includes: ? 5 or more servings of fruits and vegetables each day. At each meal, try to fill half of your plate with fruits and vegetables. ? Up to 6-8 servings of whole  grains each day. ? Less than 6 oz of lean meat, poultry, or fish each day. A 3-oz serving of meat is about the same size as a deck of cards. One egg equals 1 oz. ? 2 servings of low-fat dairy each day. ? A serving of nuts, seeds, or beans 5 times each week. ? Heart-healthy fats. Healthy fats called Omega-3 fatty acids are found in foods such as flaxseeds and coldwater fish, like sardines, salmon, and mackerel.  Limit how much you eat of the following: ? Canned or prepackaged foods. ? Food that is high in trans fat, such as fried foods. ? Food that is high in saturated fat, such as fatty meat. ? Sweets, desserts, sugary drinks, and other foods with added sugar. ? Full-fat dairy products.  Do not salt foods before eating.  Try to eat at least 2 vegetarian meals each week.  Eat more home-cooked food and less restaurant, buffet, and fast food.  When eating at a restaurant, ask that your food be prepared with less salt or no salt, if possible. What foods are recommended? The items listed may not be a complete list. Talk with your dietitian about what dietary choices are best for you. Grains Whole-grain or whole-wheat bread. Whole-grain or whole-wheat pasta. Brown rice. [place]. Bulgur. Whole-grain and low-sodium cereals. Pita bread. Low-fat, low-sodium crackers. Whole-wheat flour tortillas. Vegetables Fresh or frozen vegetables (raw, steamed, roasted, or grilled). Low-sodium or reduced-sodium tomato and vegetable juice. Low-sodium or reduced-sodium tomato sauce and tomato paste. Low-sodium or reduced-sodium canned vegetables. Fruits All fresh, dried, or frozen fruit. Canned fruit in natural juice (without added sugar). Meat and other protein foods Skinless chicken or [place]. Ground chicken or [place]. Pork with fat trimmed off. Fish and seafood. Egg whites. Dried beans, peas, or lentils. Unsalted nuts, nut butters, and seeds. Unsalted canned beans. Lean cuts of beef with fat trimmed  off. Low-sodium, lean deli meat. Dairy Low-fat (1%) or fat-free (skim) milk. Fat-free, low-fat, or reduced-fat cheeses. Nonfat, low-sodium ricotta or cottage cheese. Low-fat or nonfat yogurt. Low-fat, low-sodium cheese. Fats and oils Soft margarine without trans fats. Vegetable oil. Low-fat, reduced-fat, or light mayonnaise and salad dressings (reduced-sodium). Canola, safflower, olive, soybean, and sunflower oils. Avocado. Seasoning and other foods Herbs. Spices. Seasoning mixes without salt. Unsalted popcorn and pretzels. Fat-free sweets. What foods are not recommended? The items listed may not be a complete list. Talk with your dietitian about what dietary choices are best for you. Grains Baked goods made with fat, such as croissants, muffins, or some breads. Dry pasta or rice meal packs. Vegetables Creamed or fried vegetables. Vegetables in a cheese sauce. Regular canned vegetables (not low-sodium or reduced-sodium). Regular canned tomato sauce and paste (not low-sodium or reduced-sodium). Regular tomato and vegetable juice (not low-sodium or reduced-sodium). Angie Fava. Olives. Fruits Canned fruit in a light or heavy syrup. Fried fruit. Fruit in cream or butter sauce. Meat and other protein foods Fatty cuts of meat. Ribs. Fried meat. Berniece Salines. Sausage. Bologna and other processed lunch meats. Salami. Fatback.  Hotdogs. Bratwurst. Salted nuts and seeds. Canned beans with added salt. Canned or smoked fish. Whole eggs or egg yolks. Chicken or [place] with skin. Dairy Whole or 2% milk, cream, and half-and-half. Whole or full-fat cream cheese. Whole-fat or sweetened yogurt. Full-fat cheese. Nondairy creamers. Whipped toppings. Processed cheese and cheese spreads. Fats and oils Butter. Stick margarine. Lard. Shortening. Ghee. Bacon fat. Tropical oils, such as coconut, palm kernel, or palm oil. Seasoning and other foods Salted popcorn and pretzels. Onion salt, garlic salt, seasoned salt, table salt, and  sea salt. Worcestershire sauce. Tartar sauce. Barbecue sauce. Teriyaki sauce. Soy sauce, including reduced-sodium. Steak sauce. Canned and packaged gravies. Fish sauce. Oyster sauce. Cocktail sauce. Horseradish that you find on the shelf. Ketchup. Mustard. Meat flavorings and tenderizers. Bouillon cubes. Hot sauce and Tabasco sauce. Premade or packaged marinades. Premade or packaged taco seasonings. Relishes. Regular salad dressings. Where to find more information:  National Heart, Lung, and [place]: https://wilson-eaton.com/  American Heart Association: www.heart.org Summary  The DASH eating plan is a healthy eating plan that has been shown to reduce high blood pressure (hypertension). It may also reduce your risk for type 2 diabetes, heart disease, and stroke.  With the DASH eating plan, you should limit salt (sodium) intake to 2,300 mg a day. If you have hypertension, you may need to reduce your sodium intake to 1,500 mg a day.  When on the DASH eating plan, aim to eat more fresh fruits and vegetables, whole grains, lean proteins, low-fat dairy, and heart-healthy fats.  Work with your health care provider or diet and nutrition specialist (dietitian) to adjust your eating plan to your individual calorie needs. This information is not intended to replace advice given to you by your health care provider. Make sure you discuss any questions you have with your health care provider. Document Released: 03/16/2011 Document Revised: 03/20/2016 Document Reviewed: 03/20/2016 Elsevier Interactive Patient Education  [employer].

## 2017-04-27 NOTE — Progress Notes (Signed)
BP 124/84   Pulse 82   Temp 97.8 F (36.6 C) (Oral)   Resp 14   Wt 201 lb 9.6 oz (91.4 kg)   SpO2 98%   BMI 30.65 kg/m    Subjective:    Patient ID: Anita Weaver, female    DOB: 03-07-1976, 42 y.o.   MRN: [id number]  HPI: Anita Weaver is a 42 y.o. female  Chief Complaint  Patient presents with  . Follow-up  . Medication Refill    HPI Patient here for f/u and medicine refills she states She is establishing care with me as her primary here is leaving our practice next month  She has chronic issues with migraine without aura; seeing Dr. Krista Blue, neurologist, in [facility] has them too She has had them for 13 years Caffeine is a trigger; strong scents; weather changes Light sensitive; phonophobic She has days of migraines; she gives her 15 pills a month and patient says that is not enough She has been on one preventive This is the only emergency medicine that has worked for her, she says She got two IV infusions and BP was high; BP was 124/91 and 142/92 the last 1/10 and 1/14 HTN does run in the family Pt used to be on BP medicine The manufacturer is Louanna Raw that works I reviewed Dr. Rhea Belton notes, noting that 15 pills is all she is to be given per month and neurologist is managing her migraines and prescriptions for migraines  She takes Xanax; was going to the [place], new doctor, can't remember name; bipolar d/o Taking Latuda with good results; runs in the family  Has had asthma for years; only has problems with it during the winter; gets bronchitis or flares in the winter; coughing more; some tightness; coughing dry; father coughs and may have it; has allergies but just mild; no pets in the home; no smokers in the home  Does not have a gallbladder any more; vomiting and relfux and really bad heartburn; dexilant and occasional H2 blocker; saw GI for that  Hx of vit D def; took the Rx, now just on OTC  "Leg disease", act [place] sometimes; rarely uses  ibuprofen  Depo through GYN  Depression screen [facility] 2/9 04/27/2017 12/26/2016 09/01/2016 02/21/2016 01/20/2016  Decreased Interest 0 0 0 0 0  Down, Depressed, Hopeless 0 0 0 0 0  PHQ - 2 Score 0 0 0 0 0    Relevant past medical, surgical, family and social history reviewed Past Medical History:  Diagnosis Date  . Arthritis   . Asthma   . Chronic pelvic pain in female   . Endometriosis   . FH: breast cancer in first degree relative    Mother and sister both are BRCA positive; patient is BRCA negative  . Headache    migraines  . Hyperlipidemia   . Hypertension   . Interstitial cystitis   . Irritable bowel disease   . Neck pain    Past Surgical History:  Procedure Laterality Date  . BREAST BIOPSY Left 02/08/2017  . CHOLECYSTECTOMY    . LAPAROSCOPY  2006  . TUBAL LIGATION     Family History  Problem Relation Age of Onset  . Cancer Mother 36       breast  . Breast cancer Mother 37  . Cancer Sister 41  . Breast cancer Sister 84  . Kidney disease Son   . Heart attack Maternal Grandfather   . Heart attack Paternal Grandmother   .  Heart attack Paternal Grandfather    Social History   Tobacco Use  . Smoking status: Never Smoker  . Smokeless tobacco: Never Used  Substance Use Topics  . Alcohol use: Yes    Alcohol/week: 0.0 oz    Comment: occasionally  . Drug use: No    Interim medical history since last visit reviewed. Allergies and medications reviewed  Review of Systems Per HPI unless specifically indicated above     Objective:    BP 124/84   Pulse 82   Temp 97.8 F (36.6 C) (Oral)   Resp 14   Wt 201 lb 9.6 oz (91.4 kg)   SpO2 98%   BMI 30.65 kg/m   Wt Readings from Last 3 Encounters:  04/27/17 201 lb 9.6 oz (91.4 kg)  12/26/16 203 lb (92.1 kg)  09/18/16 209 lb (94.8 kg)    Physical Exam  Constitutional: She appears well-developed and well-nourished. No distress.  obese  HENT:  Head: Normocephalic and atraumatic.  Eyes: EOM are normal. No  scleral icterus.  Neck: No thyromegaly present.  Cardiovascular: Normal rate and regular rhythm.  Pulmonary/Chest: Effort normal and breath sounds normal.  Abdominal: She exhibits no distension.  Musculoskeletal: Normal range of motion. She exhibits no edema.  Neurological: She is alert. She exhibits normal muscle tone.  Skin: Skin is warm and dry. She is not diaphoretic. No pallor.  Psychiatric: She has a normal mood and affect. Her behavior is normal.    Results for orders placed or performed in visit on 03/20/17  POCT urine pregnancy  Result Value Ref Range   Preg Test, Ur Negative Negative      Assessment & Plan:   Problem List Items Addressed This Visit      Cardiovascular and Mediastinum   Migraine headache (Chronic)    Managed by neurologist; upon review of neurologist's notes, there are clear instructions that patient should not get more than 15 triptan pills per month; I will not prescribe additional triptans at her request; explained she will want to work with her neurologist for her migraine care        Respiratory   Asthma (Chronic)    Patient reports hx of asthma, but intermittent, no controlled medicine; I recommended pneumonia vaccine; she refused both pneumonia and flu vaccines today      Relevant Medications   albuterol (PROAIR HFA) 108 (90 Base) MCG/ACT inhaler     Digestive   Hiatal hernia with GERD    On PPI, try elev HOB      Acid reflux (Chronic)    Avoid triggers; consider discussing with GI doctor        Other   Bipolar disorder, unspecified ([place]) (Chronic)    Seeing psychiatrist; continue medicine through psychiatrist; no plans to prescribe benzo here at this clinic          Follow up plan: Return in about 6 weeks (around 06/08/2017) for complete physical.  An after-visit summary was printed and given to the patient at [facility].  Please see the patient instructions which may contain other information and recommendations beyond what is  mentioned above in the assessment and plan.  Meds ordered this encounter  Medications  . albuterol (PROAIR HFA) 108 (90 Base) MCG/ACT inhaler    Sig: Inhale 2 puffs into the lungs every 4 (four) hours as needed for wheezing or shortness of breath.    Dispense:  1 Inhaler    Refill:  2    No orders of the defined  types were placed in this encounter.

## 2017-04-27 NOTE — Assessment & Plan Note (Signed)
On PPI, try elev HOB

## 2017-04-27 NOTE — Assessment & Plan Note (Signed)
Avoid triggers; consider discussing with GI doctor

## 2017-04-27 NOTE — Assessment & Plan Note (Addendum)
Managed by neurologist; upon review of neurologist's notes, there are clear instructions that patient should not get more than 15 triptan pills per month; I will not prescribe additional triptans at her request; explained she will want to work with her neurologist for her migraine care

## 2017-04-27 NOTE — Assessment & Plan Note (Addendum)
Seeing psychiatrist; continue medicine through psychiatrist; no plans to prescribe benzo here at this clinic

## 2017-04-30 ENCOUNTER — Telehealth: Payer: Self-pay | Admitting: Family Medicine

## 2017-04-30 NOTE — Telephone Encounter (Signed)
I'd suggest she check with her insurance company to see which one is covered under her plan None of them are generic

## 2017-04-30 NOTE — Telephone Encounter (Signed)
Copied from [place] [id number]. Topic: Quick Communication - See Telephone Encounter >> Apr 30, 2017 12:57 PM Synthia Innocent wrote:  Unable to afford albuterol [facility] HFA) 108 (90 Base) MCG/ACT inhaler, can something else be called in? [facility] 04/30/17.

## 2017-05-01 NOTE — Assessment & Plan Note (Signed)
Patient reports hx of asthma, but intermittent, no controlled medicine; I recommended pneumonia vaccine; she refused both pneumonia and flu vaccines today

## 2017-05-01 NOTE — Telephone Encounter (Signed)
Left detailed voicemail

## 2017-05-08 ENCOUNTER — Telehealth: Payer: Self-pay | Admitting: Family Medicine

## 2017-05-08 NOTE — Telephone Encounter (Signed)
Copied from [place]. Topic: General - Other >> May 08, 2017  2:50 PM Conception Chancy, NT wrote: Pt was seen on 04/27/17 and states Dr. Sanda Klein was supposed to get in touch with her neurologist about her BP medication and she has not heard anything back. She is calling to follow up.

## 2017-05-08 NOTE — Telephone Encounter (Signed)
Copied from [place]. Topic: General - Other >> May 08, 2017  2:50 PM Conception Chancy, NT wrote: Pt was seen on 04/27/17 and states Dr. Sanda Klein was supposed to get in touch with her neurologist about her BP medication and she has not heard anything back. She is calling to follow up.

## 2017-05-09 NOTE — Telephone Encounter (Signed)
Please see below, do I need to contact Neuro office? Please advise. Thanks!

## 2017-05-09 NOTE — Telephone Encounter (Signed)
Please call neurologist See how she feels about starting CCB or beta-blocker for her migraines I don't know if that has been tried Thank you

## 2017-05-10 ENCOUNTER — Telehealth: Payer: Self-pay | Admitting: Neurology

## 2017-05-10 NOTE — Telephone Encounter (Signed)
[place] at pts PCP is calling wanting to know how Dr. Krista Blue feels about putting the pt on a beta blocker for migraines. Please call to discuss at [phone]

## 2017-05-10 NOTE — Telephone Encounter (Signed)
Called [facility], [place] with Dr.Yan asking about beta blockers and CCB. Will call back when provider responds.

## 2017-05-10 NOTE — Telephone Encounter (Signed)
Spoke to patient - she is currently taking topiramate 100mg , BID.  States she is doing well on this mediation with no adverse side effects.  She is only using her rizatriptan 1-2 times per week and will occasionally have to come in for IV medications.  She is pleased with her current therapy.  She does not wish to start an additional medication at this time.

## 2017-05-11 NOTE — Telephone Encounter (Signed)
Returned call to PCP office.  They are also able to view patient's response below in Epic.

## 2017-05-21 ENCOUNTER — Telehealth: Payer: Self-pay | Admitting: Neurology

## 2017-05-21 NOTE — Telephone Encounter (Signed)
Called [employer] [phone]) - initiated quantity override case ([id number]) - decision pending (can take up to four days for this type of case per her plan).  If approved, then she can get rx at pharmacy.  If denied, we will have to move forward with an appeal.  Pt aware of status.

## 2017-05-21 NOTE — Telephone Encounter (Signed)
Spoke to Roxy - her insurance plan this year will only cover #4 rizatriptan tablets per month.  She called them and was instructed to request an appeal letter be sent to the following fax number: [phone].

## 2017-05-21 NOTE — Telephone Encounter (Signed)
Pt calling requesting to speak with RN stating that its to much to go into detail to. Along the line of an appeal insurance is needing

## 2017-05-22 ENCOUNTER — Encounter: Payer: Self-pay | Admitting: *Deleted

## 2017-05-22 NOTE — Telephone Encounter (Signed)
Received notification from [employer] that rizatriptan does not require a PA and quantities above the plan limit are excluded.  An appeal letter has been written and faxed to [facility] Appeal department [phone]).  The patient is aware of status.

## 2017-05-23 NOTE — Telephone Encounter (Signed)
See documentation from telephone encounter on 05/10/2017

## 2017-05-25 NOTE — Telephone Encounter (Signed)
[facility].  She has not been seen since June (by Hoyle Sauer then).  YY will need to see before ordering another inj.  If she can be here in the next hour, YY will see her to discuss med change, and may order infusion to be done today as well/fim

## 2017-05-25 NOTE — Telephone Encounter (Signed)
I have asked Anita Weaver to call pt. and offer her YY's next available appt. and will call her if something sooner comes available.  YY had w/i capability today, but does not on Monday.  Pt. had requested IV for migraine today, so should have been able to come for ov./fim

## 2017-05-25 NOTE — Telephone Encounter (Signed)
Pt returned RN's call. She is unable to come today, the earliest she could come would be Monday. Please call to advise

## 2017-05-25 NOTE — Telephone Encounter (Signed)
Pt called stating she is in another migraine cycle and is wanting to possible come in for another infusion. Pt also states that topiramate isn't working that well and is wanting to know if she can switch to something different.

## 2017-05-25 NOTE — Telephone Encounter (Signed)
Spoke with Anita Weaver. She sts. she doesn't understand why she needs to see YY; why YY can't order IV infusion.  I have explained that YY must seen her periodically in order to responsibly rx. any type of medication for her, including IV meds for h/a.  Again, I have explained that YY is able to work her in today.  Pt. sts. she is not able to come in, would like to schedule IV for h/a for Monday.  I have explained that we don't normally rx. IV's to be done several days out; IV infusion is a last resort for migraine mx, and if ordered, is meant to be done at the time of the order.  Her h/a may improve and she may not need IV on Monday.  She verbalized understanding of same, sts. she has not been in to see YY "b/c she told me my family doctor could prescribe my regular medications."  I have confirmed this, and explained that if her pcp would like to take over the meds YY rx'd by this office, she would not need to come back here, but if she receives any rx's from our office at all, including IV infusions for migraines, then she must periodically be evaluated by YY.  I have again explained, that we are here for her today, able to see her today, but if she is not able to come in and feels she needs urgent mx. of migraine, she should go to urgent care.  Pt. is argumentative, wants to know what urgent care will do for her.  I have explained that I can't tell her how urgent care would treat her h/a. I have reassured her that we do have frequent cancellations and will work to get her in asap. Pt. verbalized understanding of same/fim

## 2017-05-25 NOTE — Telephone Encounter (Signed)
[place].  Next available appt. has been made.  She should not have to wait for that appt.--we will be able to get her in sooner, as we often have cancellations.  We will call her when a sooner appt. becomes available.  Dr. Krista Blue is aware that she has called.  A w/i appt. was offered, as pt. had indicated she could come in for an infusion, but pt. was not able to take w/i appt. today./fim

## 2017-05-25 NOTE — Telephone Encounter (Signed)
I called the patient back, she is not able to come today, it was last minute, she could not get off work, it would need to be after 2:30. I was able to schedule the 1st available with Dr Krista Blue 5/28 but patient said she could not wait until then to see Dr Krista Blue. I told her the Rn could call her back and try to get her in sooner but I was not able to do so. Pt said Dr Krista Blue would have to do something before then she could not just sit with a migraine. Pt said it was not her fault the clinic closes at noon today. I told her she could go to the ED if she felt it was an emergency. Pt asked so Dr Krista Blue is not going to give me an injection, I told her no per RN's note she would have to be seen 1st. Patient was uhappy her request could not be met today. Please call to advise.

## 2017-05-28 NOTE — Telephone Encounter (Signed)
No, she should go to urgent care or the ER if she needs treatment like that We certainly hope she feels better soon

## 2017-05-28 NOTE — Telephone Encounter (Signed)
Left pt a detailed voicemail   Copied from [place]. Topic: Inquiry >> May 28, 2017 10:46 AM Scherrie Gerlach wrote: Reason for CRM: pt states she is in a migraine cycle and generally sees the neurologist for migraine infusion. However pt cannot see the neurologist until May.(which is the first appt) Pt would like like to know if Dr Sanda Klein does migraine infusions or migraine injections such as Toradol Pt can be reached before 2:30 pm   [phone] After 2:30  cell: [phone]

## 2017-05-29 ENCOUNTER — Other Ambulatory Visit: Payer: Self-pay | Admitting: *Deleted

## 2017-05-29 ENCOUNTER — Encounter: Payer: Self-pay | Admitting: Neurology

## 2017-05-29 ENCOUNTER — Ambulatory Visit: Payer: 59 | Admitting: Neurology

## 2017-05-29 ENCOUNTER — Encounter (INDEPENDENT_AMBULATORY_CARE_PROVIDER_SITE_OTHER): Payer: Self-pay

## 2017-05-29 VITALS — BP 114/85 | HR 82 | Ht 68.0 in | Wt 204.0 lb

## 2017-05-29 DIAGNOSIS — G43909 Migraine, unspecified, not intractable, without status migrainosus: Secondary | ICD-10-CM

## 2017-05-29 DIAGNOSIS — G43709 Chronic migraine without aura, not intractable, without status migrainosus: Secondary | ICD-10-CM

## 2017-05-29 MED ORDER — RIZATRIPTAN BENZOATE 10 MG PO TABS: ORAL_TABLET | ORAL | 3 refills | Status: DC

## 2017-05-29 MED ORDER — NORTRIPTYLINE HCL 25 MG PO CAPS: 50.0000 mg | ORAL_CAPSULE | Freq: Every day | ORAL | 11 refills | Status: DC

## 2017-05-29 MED ORDER — FREMANEZUMAB-VFRM 225 MG/1.5ML ~~LOC~~ SOSY: 225.0000 mg | PREFILLED_SYRINGE | SUBCUTANEOUS | 11 refills | Status: DC

## 2017-05-29 NOTE — Telephone Encounter (Signed)
Patient returned call and will come today for an appt.

## 2017-05-29 NOTE — Progress Notes (Signed)
[facility]  PATIENT: Anita Weaver DOB: 08-23-1975  HISTORY OF PRESENT ILLNESS:Anita Weaver is a 42 years old right-handed Caucasian female, accompanied by her husband, referred by her primary care physician Dr. Manuella Ghazi for evaluation of migraine headaches  She reported a history of migraine headaches since age 19, at that time, she also has excessive stress, for a while, she been having headaches daily, since her separation with her previous husband, her headache has much improved, doing very well from 2013-2014, but since October 2014, she began to get migraine again, gradually getting worse, to almost daily basis, she has been taking Excedrin Migraine every day 2-4 tablets, her migraine headaches are usually lateralized severe pounding headaches with associated light noise sensitivity, nauseous, lasting for days. Previously she has tried different preventive medications including Topamax, moderate help, nortriptyline, no help, there was an 1 medication prescribed by her psychologist for her bipolar disorder, Depakote has been very helpful for him migraine Over-the-counter Tylenol, ibuprofen and is no longer helpful, Excedrin migraine only provide mild temporary relief, she has tried triptan treatment in the past, Imitrex causing heart racing, sweaty, difficulty breathing, Maxalt has been very helpful, but she was not able afford it because the high co-pay, Flexeril has dade her sleepy Trigger for her migraines, weather change, stress, caffeine, sleep deprivation  She had tubal ligations now, she also complains of worsening depression over the past few months, will set up appointment with her psychologist, she is not not on any medications  UPDATE August 3rd 2015:YY She came in with her husband today, She has tried Depakote ER 566m qhs for almost 2 months without helping, worry about side effect, Inderal 423mbid did not help either.  She still complains of headaches 20 days in a  month, Maxalt works well, headache would go away in 30 minutes, she used it up all 15 tabs of Maxalt in one month. She also complains of dizziness, spinning sensation, fall over, slurred speech  MRI of the brain in September 2015 was essentially normal. She is not on any preventive medications now, for a while, her migraines well controlled by Maxalt, but since May 2016, she began to have frequent, almost daily headaches, left retro-orbital area moderate to severe pounding headache with associated light noise sensitivity, she been taking almost daily Maxalt, Which helps her, but only temporarily, She does not want to consider Botox injection as preventive medications, also complains of midline neck pain, achy sensation, no radiating pain, no bilateral upper extremity paresthesia and weakness. Topamax 10039mid works for her somewhat in the past, wants to be back on it  UPDATE 12/12/2016CM Anita Weaver, [age]ar old female returns for follow-up. She is previously been seen by Dr. YanKrista Blue this office. Her headache today has been ongoing for several weeks it goes away with Maxalt but returns. Her pain level today is 8. She's also had some nausea. Pain is in the left retro-orbital area pounding associated with light sensitivity. She is currently on Topamax as a preventive. She also has Flexeril does not take that very much. She returns for reevaluation. In the last 3 months she has been to urgent care twice for Toradol injections. She wakes with some of her headaches in the morning UPDATE 06/11/2018CM Anita Weaver, [age]ar old female returns for follow-up with last office visit being December 2016. She has been seen in the infusion area several times for IV migraine cocktail. She has a history of migraine headaches with associated symptoms of photophobia and phonophobia and  nausea. Her symptoms can be aggravated by weather changes She is currently on Topamax 50 mg twice daily Maxalt acutely. She has Zofran for  nausea. She has not kept a record of her headaches however she states she has more headaches in the summer months. Her migraine triggers are caffeine and artificial sweeteners. She returns for reevaluation Patient is still experiencing an acute migraine that is unresponsive to rizatriptan and would like to come in for an infusion.  States she had been drinking caffeine that caused this migraine and she has since stopped use of all caffeine.  She also tells me that she has been using rizatriptan daily.    UPDATE May 29 2017: She is accompanied by her husband at today's clinical visit, she complains of frequent migraine headaches, about 14 headaches in a month, usually triggered by caffeine intake, but despite tapering off of her caffeine intake, she has frequent migraine headaches, call office multiple times require IV infusion, this particular headache has been ongoing for 2 weeks, she has tried multiple dose almost daily dose of Maxalt without breaking her headache.   Last iv infusion on Jan 9th, she used up all her 15 tablets of Maxalt each month, call the office multiple times requiring preauthorization, early refill, did respond to Depacon IV infusion along with Toradol, and Phenergan.  REVIEW OF SYSTEMS: Full 14 system review of systems performed and notable only for those listed, all others are neg:  Dizziness, headaches, muscle cramps, murmur  ALLERGIES: Allergies  Allergen Reactions  . Imitrex [Sumatriptan] Other (See Comments)    Heart palpitations, hot/cold sweats  . Demerol [Meperidine] Other (See Comments)  . Demerol [Meperidine]   . Flagyl [Metronidazole] Hives    HOME MEDICATIONS: Outpatient Medications Prior to Visit  Medication Sig Dispense Refill  . albuterol (PROAIR HFA) 108 (90 Base) MCG/ACT inhaler Inhale 2 puffs into the lungs every 4 (four) hours as needed for wheezing or shortness of breath. 1 Inhaler 2  . ALPRAZolam (XANAX) 0.5 MG tablet Take 0.5 mg by mouth at  bedtime as needed for anxiety.    . DEXILANT 60 MG capsule TAKE 1 CAPSULE BY MOUTH DAILY 30 capsule 11  . ibuprofen (ADVIL,MOTRIN) 800 MG tablet Take 1 tablet (800 mg total) by mouth every 8 (eight) hours as needed. 30 tablet 2  . lurasidone (LATUDA) 40 MG TABS tablet Take 20 mg by mouth daily with breakfast.    . medroxyPROGESTERone (DEPO-PROVERA) 150 MG/ML injection Inject 1 mL (150 mg total) into the muscle every 3 (three) months. 1 mL 3  . MedroxyPROGESTERone Acetate 150 MG/ML SUSY INJECT 1 ML (150 MG TOTAL) INTO THE MUSCLE EVERY 3 (THREE) MONTHS. 1 Syringe 3  . ondansetron (ZOFRAN) 4 MG tablet Take 1 tablet (4 mg total) by mouth every 8 (eight) hours as needed for nausea or vomiting. 90 tablet 0  . ranitidine ([facility] RANITIDINE) 75 MG tablet Take by mouth. Reported on 04/15/2015    . rizatriptan (MAXALT) 10 MG tablet Take 1 tab at onset of migraine.  May repeat in 2 hrs, if needed.  Max dose: 2 tabs/day. This is a 30 day prescription. 15 tablet 5  . topiramate (TOPAMAX) 50 MG tablet Take 2 tablets (100 mg total) by mouth 2 (two) times daily. 360 tablet 0   Facility-Administered Medications Prior to Visit  Medication Dose Route Frequency Provider Last Rate Last Dose  . medroxyPROGESTERone (DEPO-PROVERA) injection 150 mg  150 mg Intramuscular Q90 days Emily Filbert, MD   150  mg at 03/20/17 1523    PAST MEDICAL HISTORY: Past Medical History:  Diagnosis Date  . Arthritis   . Asthma   . Chronic pelvic pain in female   . Endometriosis   . FH: breast cancer in first degree relative    Mother and sister both are BRCA positive; patient is BRCA negative  . Headache    migraines  . Hyperlipidemia   . Hypertension   . Interstitial cystitis   . Irritable bowel disease   . Neck pain     PAST SURGICAL HISTORY: Past Surgical History:  Procedure Laterality Date  . BREAST BIOPSY Left 02/08/2017  . CHOLECYSTECTOMY    . LAPAROSCOPY  2006  . TUBAL LIGATION      FAMILY HISTORY: Family History    Problem Relation Age of Onset  . Cancer Mother 23       breast  . Breast cancer Mother 7  . Cancer Sister 19  . Breast cancer Sister 53  . Kidney disease Son   . Heart attack Maternal Grandfather   . Heart attack Paternal Grandmother   . Heart attack Paternal Grandfather     SOCIAL HISTORY: Social History   Socioeconomic History  . Marital status: Married    Spouse name: [name]  . Number of children: 3  . Years of education: 24  . Highest education level: Not on file  Social Needs  . Financial resource strain: Not on file  . Food insecurity - worry: Not on file  . Food insecurity - inability: Not on file  . Transportation needs - medical: Not on file  . 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 Smoking status: Never Smoker  . Smokeless tobacco: Never Used  Substance and Sexual Activity  . Alcohol use: Yes    Alcohol/week: 0.0 oz    Comment: occasionally  . Drug use: No  . Sexual activity: Yes    Partners: Male    Birth control/protection: Surgical    Comment: tubaligation  Other Topics Concern  . Not on file  Social History Narrative   ** Merged History Encounter **       ** Data from: 02/09/14 Enc Dept: [facility]       ** Data from: 11/10/13 Enc Dept: [facility]   Patient lives at home with [name]   Patient works full time.   Right handed.   Caffeine- none   Education- high school     PHYSICAL EXAM  Vitals:   05/29/17 1045  BP: 114/85  Pulse: 82  Weight: 204 lb (92.5 kg)  Height: 5' 8" (1.727 m)   Body mass index is 31.02 kg/m.  Generalized: Well developed, in no acute distress  Head: normocephalic and atraumatic,. Oropharynx benign  Neck: Supple,  Musculoskeletal: No deformity   Neurological examination   Mentation: Alert oriented to time, place, history taking. Attention span and concentration appropriate. Recent and remote memory intact.   Follows all commands speech and language fluent.  Cranial nerve II-XII: .Pupils were equal round reactive to light extraocular movements were full, visual field were full on confrontational test. Facial sensation and strength were normal. hearing was intact to finger rubbing bilaterally. Uvula tongue midline. Head turning and shoulder shrug were normal and symmetric.Tongue protrusion into cheek strength was normal. Motor: normal bulk and tone, full strength in the BUE, BLE, fine finger movements normal, no pronator drift. No focal  weakness Sensory: normal and symmetric to light touch, pinprick, and  Vibration, in the upper and lower extremities Coordination: finger-nose-finger, heel-to-shin bilaterally, no dysmetria Reflexes: Symmetric upper and lower plantar responses were flexor bilaterally. Gait and Station: Rising up from seated position without assistance, normal stance,  moderate stride, good arm swing, smooth turning, able to perform tiptoe, and heel walking without difficulty. Tandem gait is steady  DIAGNOSTIC DATA (LABS, IMAGING, TESTING)   ASSESSMENT AND PLAN  42 y.o. year old female   Chronic migraine headaches,  Continue Topamax 50 mg 2 tablets twice a day as migraine prevention  Add on nortriptyline 20 mg titrating to 50 mg every night for migraine prevention,  I also offered her Ajovy 225 mg every month as migraine prevention  Maxalt as needed, can combine it together with Zofran, NSAIDs,  Marcial Pacas, M.D. Ph.D.  [facility] [place], [place] [address] Phone: [phone] Fax:      [phone]

## 2017-05-29 NOTE — Telephone Encounter (Signed)
Dr. Krista Blue had a cancelation this morning at 10:30am.  I have called and left patient a message, offering her this appt time today.

## 2017-05-29 NOTE — Patient Instructions (Signed)
You may mix Maxalt together with  Nausea medicine Zofran, Aleve 1-2 tablets, Benadryl 25 mg Muscle relaxant tizanidine 4 mg as needed during a prolonged severe headaches

## 2017-05-29 NOTE — Telephone Encounter (Signed)
Appeal letter denied.  Called [employer] at [phone] and spoke with Flllor  (resolution specialist). She states her plan allows 4 tablets per one day and per prescription. She verified this with MD (ref# for that call: [id number]). Her copay for 4 tablets is $10.00.   If she has prescription sent to [facility] 15 tabs with goodrx coupon would be about 18.63. Information provided to Dr. Krista Blue to give to patient.

## 2017-05-30 DIAGNOSIS — G43909 Migraine, unspecified, not intractable, without status migrainosus: Secondary | ICD-10-CM | POA: Diagnosis not present

## 2017-05-31 ENCOUNTER — Encounter: Payer: Self-pay | Admitting: Neurology

## 2017-05-31 ENCOUNTER — Encounter: Payer: Self-pay | Admitting: *Deleted

## 2017-05-31 NOTE — Telephone Encounter (Signed)
She is able to get a 90-day supply at [facility] for $47.27 with a goodrx.com coupon.  A prescription was sent to them for #45 with instructions to not take more than 2 per day or 15 per month - no early refills.  Also, [facility] and all other refills of this medication were voided.

## 2017-06-04 ENCOUNTER — Telehealth: Payer: Self-pay | Admitting: Neurology

## 2017-06-04 ENCOUNTER — Encounter: Payer: Self-pay | Admitting: *Deleted

## 2017-06-04 DIAGNOSIS — G43709 Chronic migraine without aura, not intractable, without status migrainosus: Secondary | ICD-10-CM

## 2017-06-04 MED ORDER — ERENUMAB-AOOE 70 MG/ML ~~LOC~~ SOAJ: 70.0000 mg | SUBCUTANEOUS | 11 refills | Status: DC

## 2017-06-04 MED ORDER — ONDANSETRON HCL 4 MG PO TABS: ORAL_TABLET | ORAL | 11 refills | Status: DC

## 2017-06-04 MED ORDER — TIZANIDINE HCL 4 MG PO TABS: ORAL_TABLET | ORAL | 0 refills | Status: DC

## 2017-06-04 NOTE — Telephone Encounter (Signed)
Pt is having dry mouth and excessive sleeping with nortriptyline (PAMELOR) 25 MG capsule . She does not want to take it. Please call to advise

## 2017-06-04 NOTE — Telephone Encounter (Signed)
Dr. Krista Weaver has reviewed patient's chart.  Discontinue nortriptyline.  Anita Weaver is not covered by her insurance plan.  Aimovig and Emgality is covered with prior authorization.  Per vo by Dr. Krista Weaver, provide rx for Aimovig 70mg  once monthly.  Rx sent to pharmacy.  Pt aware of change and agreeable to the plan.

## 2017-06-04 NOTE — Telephone Encounter (Signed)
Additionally, Dr. Krista Blue has provided prescriptions for ondansetron and tizanidine for migraines.  PA for Aimovig approved by [employer] [phone]) through 09/01/17.  ZM#[id number].  Member PQ#[id number].

## 2017-06-06 ENCOUNTER — Ambulatory Visit (INDEPENDENT_AMBULATORY_CARE_PROVIDER_SITE_OTHER): Payer: 59 | Admitting: *Deleted

## 2017-06-06 ENCOUNTER — Ambulatory Visit: Payer: 59

## 2017-06-06 DIAGNOSIS — Z3042 Encounter for surveillance of injectable contraceptive: Secondary | ICD-10-CM | POA: Diagnosis not present

## 2017-06-06 MED ORDER — MEDROXYPROGESTERONE ACETATE 150 MG/ML IM SUSP
150.0000 mg | Freq: Once | INTRAMUSCULAR | Status: AC
  Administered 2017-06-06: 150 mg via INTRAMUSCULAR

## 2017-06-12 ENCOUNTER — Telehealth: Payer: Self-pay | Admitting: Neurology

## 2017-06-12 NOTE — Telephone Encounter (Signed)
I called pt, advised her that Dr. Krista Blue wants her to continue with topamax and ajovy for migraine prevention and follow up in May. Pt verbalized understanding of these recommendations.  I spoke with Dr. Krista Blue and she agrees with this plan.

## 2017-06-12 NOTE — Telephone Encounter (Signed)
Pt called requesting a call back to discuss id Dr. Krista Blue wants her to keep taking topiramate (TOPAMAX) 50 MG tablet, also if she needs to keep her appt on 5/28

## 2017-07-02 ENCOUNTER — Other Ambulatory Visit: Payer: Self-pay | Admitting: Neurology

## 2017-07-10 ENCOUNTER — Other Ambulatory Visit: Payer: Self-pay | Admitting: Neurology

## 2017-07-10 DIAGNOSIS — G43709 Chronic migraine without aura, not intractable, without status migrainosus: Secondary | ICD-10-CM

## 2017-08-16 ENCOUNTER — Encounter: Payer: Self-pay | Admitting: General Surgery

## 2017-08-22 ENCOUNTER — Ambulatory Visit (INDEPENDENT_AMBULATORY_CARE_PROVIDER_SITE_OTHER): Payer: 59

## 2017-08-22 VITALS — BP 117/85 | HR 75

## 2017-08-22 DIAGNOSIS — Z3042 Encounter for surveillance of injectable contraceptive: Secondary | ICD-10-CM

## 2017-08-22 NOTE — Progress Notes (Signed)
Patient presented to the office today for depo-provera injection received in left arm 150 mg. [phone]  Patient tolerated well and will follow up around for next injection around  06/31/2019-11/21/2017.

## 2017-08-22 NOTE — Progress Notes (Signed)
Attestation of Attending Supervision of clinical support staff: I agree with the care provided to this patient and was available for any consultation.  I have reviewed the CMA's note and chart, and I agree with the management and plan.  Keilyn Haggard Niles Martinez Boxx, MD, MPH, ABFM Attending Physician Faculty Practice- [facility]  

## 2017-08-29 ENCOUNTER — Ambulatory Visit: Payer: Commercial Managed Care - HMO | Admitting: Neurology

## 2017-09-04 ENCOUNTER — Ambulatory Visit: Payer: Commercial Managed Care - HMO | Admitting: Neurology

## 2017-09-05 ENCOUNTER — Telehealth: Payer: Self-pay | Admitting: *Deleted

## 2017-09-05 NOTE — Telephone Encounter (Signed)
Aimovig 70mg  PA approved by [employer] ([phone]) through 09/06/2018.  KM#[id number].

## 2017-09-10 ENCOUNTER — Other Ambulatory Visit: Payer: Self-pay

## 2017-09-10 DIAGNOSIS — N809 Endometriosis, unspecified: Secondary | ICD-10-CM

## 2017-09-10 DIAGNOSIS — Z30013 Encounter for initial prescription of injectable contraceptive: Secondary | ICD-10-CM

## 2017-09-10 MED ORDER — MEDROXYPROGESTERONE ACETATE 150 MG/ML IM SUSP: 150.0000 mg | INTRAMUSCULAR | 3 refills | Status: DC

## 2017-09-10 NOTE — Telephone Encounter (Signed)
[facility]ill on [facility] [place]

## 2017-09-23 ENCOUNTER — Other Ambulatory Visit: Payer: Self-pay | Admitting: Neurology

## 2017-09-23 DIAGNOSIS — G43709 Chronic migraine without aura, not intractable, without status migrainosus: Secondary | ICD-10-CM

## 2017-11-07 ENCOUNTER — Ambulatory Visit (INDEPENDENT_AMBULATORY_CARE_PROVIDER_SITE_OTHER): Payer: 59

## 2017-11-07 VITALS — BP 128/92 | HR 84 | Resp 16 | Ht 65.0 in | Wt 201.6 lb

## 2017-11-07 DIAGNOSIS — Z30013 Encounter for initial prescription of injectable contraceptive: Secondary | ICD-10-CM

## 2017-11-07 DIAGNOSIS — N809 Endometriosis, unspecified: Secondary | ICD-10-CM

## 2017-11-07 DIAGNOSIS — Z3042 Encounter for surveillance of injectable contraceptive: Secondary | ICD-10-CM | POA: Diagnosis not present

## 2017-11-07 MED ORDER — MEDROXYPROGESTERONE ACETATE 150 MG/ML IM SUSP: 150.0000 mg | INTRAMUSCULAR | 3 refills | Status: DC

## 2017-11-07 NOTE — Addendum Note (Signed)
Addended by: Tommas Olp B on: 11/07/2017 05:04 PM   Modules accepted: Orders

## 2017-11-07 NOTE — Progress Notes (Signed)
HPI: Patient is here for her Depo Provera injection. Last: 08/22/17 - Left Deltoid. Window: 11/07/17 - 11/21/17. Patient is within window. Patient reports no complaints with last injection.  Assessment and Plan: Administered Depo Provera injection - Right Deltoid. Patient tolerated injection well without complaints. Patient advised to schedule next Depo Provera injection between 10/16 - 02/06/18. Patient stated she understood.

## 2017-11-08 NOTE — Progress Notes (Signed)
Attestation of Attending Supervision of clinical support staff: I agree with the care provided to this patient and was available for any consultation.  I have reviewed the CMA's note and chart, and I agree with the management and plan.  Ceil Roderick Niles Waddell Iten, MD, MPH, ABFM Attending Physician Faculty Practice- [facility]  

## 2017-11-14 ENCOUNTER — Other Ambulatory Visit: Payer: Self-pay | Admitting: Gastroenterology

## 2017-11-19 ENCOUNTER — Other Ambulatory Visit: Payer: Self-pay

## 2017-11-19 DIAGNOSIS — Z30013 Encounter for initial prescription of injectable contraceptive: Secondary | ICD-10-CM

## 2017-11-19 DIAGNOSIS — N809 Endometriosis, unspecified: Secondary | ICD-10-CM

## 2017-11-19 MED ORDER — MEDROXYPROGESTERONE ACETATE 150 MG/ML IM SUSP: 150.0000 mg | INTRAMUSCULAR | 3 refills | Status: DC

## 2017-12-06 ENCOUNTER — Other Ambulatory Visit: Payer: Self-pay | Admitting: Neurology

## 2017-12-06 DIAGNOSIS — G43709 Chronic migraine without aura, not intractable, without status migrainosus: Secondary | ICD-10-CM

## 2017-12-10 ENCOUNTER — Other Ambulatory Visit: Payer: Self-pay | Admitting: Gastroenterology

## 2017-12-12 ENCOUNTER — Telehealth: Payer: Self-pay | Admitting: Neurology

## 2017-12-12 NOTE — Telephone Encounter (Signed)
Pt requesting a call stating she is in a migraine cycle, would like to discuss coming in for an infusion. Please call to advise

## 2017-12-12 NOTE — Telephone Encounter (Signed)
Per vo by Dr. Krista Blue, she may come in for the following IV treatment (with a driver):  1) Depakote 1000mg  IV  2) Toradol 30mg  IV 3) Phenergan 12.5mg  IV  Dr. Krista Blue will allow the IV to be repeated once, if needed, on the following day.

## 2017-12-12 NOTE — Telephone Encounter (Addendum)
Spoke to patient - she has been in her current migraine cycle for the last two weeks.  She has failed her rescue medications.  She is currently out of town but will be coming back home tomorrow evening.  She would like to go ahead and schedule an infusion appt for Friday morning.  Spoke to [place] in [place] and she has scheduled her to arrive at 11:30am.  Shawnie is agreeable to this time and will come with a driver.  Otila Kluver has the signed MD orders.

## 2017-12-13 ENCOUNTER — Other Ambulatory Visit: Payer: Self-pay | Admitting: *Deleted

## 2017-12-13 DIAGNOSIS — G43709 Chronic migraine without aura, not intractable, without status migrainosus: Secondary | ICD-10-CM

## 2017-12-13 MED ORDER — TOPIRAMATE 50 MG PO TABS: 100.0000 mg | ORAL_TABLET | Freq: Two times a day (BID) | ORAL | 1 refills | Status: DC

## 2017-12-14 DIAGNOSIS — G43909 Migraine, unspecified, not intractable, without status migrainosus: Secondary | ICD-10-CM | POA: Diagnosis not present

## 2018-01-02 ENCOUNTER — Ambulatory Visit: Payer: 59 | Admitting: Gastroenterology

## 2018-01-02 ENCOUNTER — Encounter (INDEPENDENT_AMBULATORY_CARE_PROVIDER_SITE_OTHER): Payer: Self-pay

## 2018-01-02 ENCOUNTER — Encounter: Payer: Self-pay | Admitting: Gastroenterology

## 2018-01-02 VITALS — BP 146/78 | HR 74 | Ht 68.0 in | Wt 202.0 lb

## 2018-01-02 DIAGNOSIS — K219 Gastro-esophageal reflux disease without esophagitis: Secondary | ICD-10-CM

## 2018-01-02 NOTE — Progress Notes (Signed)
Primary Care Physician: Arnetha Courser, MD  Primary Gastroenterologist:  Dr. Lucilla Lame  Chief Complaint  Patient presents with  . Medication Refill    HPI: Anita Weaver is a 42 y.o. female here for follow-up of her GERD.  The patient states that her medication is 99% of the time effective and she only has acid breakthrough infrequently.  The patient states that she does have lower abdominal pain that is a burning pain.  She does not associate that with any eating or drinking.  She reports that she has chronic lower back pain.  The patient does take ranitidine when she has the lower abdominal pain and also reports that she takes milk.  This does not usually help her symptoms but she states that her acid breakthrough is helped by the milk.  She is also in need of a refill of her Dexilant.  Current Outpatient Medications  Medication Sig Dispense Refill  . DEXILANT 60 MG capsule TAKE 1 CAPSULE BY MOUTH DAILY 30 capsule 1  . Erenumab-aooe (AIMOVIG) 70 MG/ML SOAJ Inject 70 mg into the skin every 30 (thirty) days. 1 pen 11  . ibuprofen (ADVIL,MOTRIN) 800 MG tablet Take 1 tablet (800 mg total) by mouth every 8 (eight) hours as needed. 30 tablet 2  . lurasidone (LATUDA) 40 MG TABS tablet Take 20 mg by mouth daily with breakfast.    . medroxyPROGESTERone (DEPO-PROVERA) 150 MG/ML injection Inject 1 mL (150 mg total) into the muscle every 3 (three) months. 1 mL 3  . ondansetron (ZOFRAN) 4 MG tablet Take one tablet every 8 hours as needed. #20 to last 30 days. For migraines. 20 tablet 11  . ranitidine ([facility] RANITIDINE) 75 MG tablet Take by mouth. Reported on 04/15/2015    . rizatriptan (MAXALT) 10 MG tablet Take 1 tab at onset of migraine.  May repeat once in 2 hrs, if needed.  Max dose: 2 tabs/day. This is a 90 day prescription. 45 tablet 3  . topiramate (TOPAMAX) 50 MG tablet Take 2 tablets (100 mg total) by mouth 2 (two) times daily. 360 tablet 1  . albuterol (PROAIR HFA) 108 (90 Base) MCG/ACT  inhaler Inhale 2 puffs into the lungs every 4 (four) hours as needed for wheezing or shortness of breath. (Patient not taking: Reported on 01/02/2018) 1 Inhaler 2  . ALPRAZolam (XANAX) 0.5 MG tablet Take 0.5 mg by mouth at bedtime as needed for anxiety.    Marland Kitchen tiZANidine (ZANAFLEX) 4 MG tablet Take one tablet every 8 hours as needed. #20 to last 30 days.  For migraines. (Patient not taking: Reported on 01/02/2018) 30 tablet 0   Current Facility-Administered Medications  Medication Dose Route Frequency Provider Last Rate Last Dose  . medroxyPROGESTERone (DEPO-PROVERA) injection 150 mg  150 mg Intramuscular Q90 days Hulan Fray, Myra C, MD   150 mg at 11/07/17 1535    Allergies as of 01/02/2018 - Review Complete 01/02/2018  Allergen Reaction Noted  . Imitrex [sumatriptan] Other (See Comments) 04/24/2014  . Demerol [meperidine] Other (See Comments) 04/18/2012  . Demerol [meperidine]  05/08/2013  . Flagyl [metronidazole] Hives 04/15/2015    ROS:  General: Negative for anorexia, weight loss, fever, chills, fatigue, weakness. ENT: Negative for hoarseness, difficulty swallowing , nasal congestion. CV: Negative for chest pain, angina, palpitations, dyspnea on exertion, peripheral edema.  Respiratory: Negative for dyspnea at rest, dyspnea on exertion, cough, sputum, wheezing.  GI: See history of present illness. GU:  Negative for dysuria, hematuria, urinary incontinence, urinary  frequency, nocturnal urination.  Endo: Negative for unusual weight change.    Physical Examination:   BP (!) 146/78   Pulse 74   Ht 5\' 8"  (1.727 m)   Wt 202 lb (91.6 kg)   BMI 30.71 kg/m   General: Well-nourished, well-developed in no acute distress.  Eyes: No icterus. Conjunctivae pink. Mouth: Oropharyngeal mucosa moist and pink , no lesions erythema or exudate. Lungs: Clear to auscultation bilaterally. Non-labored. Heart: Regular rate and rhythm, no murmurs rubs or gallops.  Abdomen: Bowel sounds are normal, nontender,  nondistended, no hepatosplenomegaly or masses, no abdominal bruits or hernia , no rebound or guarding.   Extremities: No lower extremity edema. No clubbing or deformities. Neuro: Alert and oriented x 3.  Grossly intact. Skin: Warm and dry, no jaundice.   Psych: Alert and cooperative, normal mood and affect.  Labs:    Imaging Studies: No results found.  Assessment and Plan:   Anita Weaver is a 42 y.o. y/o female who comes in with a history of GERD that has been well controlled with Dexilant.  The patient has lower abdominal pain that appears to be musculoskeletal and she reports that she has a history of lower back pain also.  The patient's lower abdominal pain is not consistent with GI issues.  The patient denies any unexplained weight loss fevers chills nausea or vomiting.  The patient will have her medications refilled and has been told to use warm compresses to her lower abdomen when the pain occurs.  The patient has been explained the plan and agrees with it    Lucilla Lame, MD. [name]   Note: This dictation was prepared with Dragon dictation along with smaller phrase technology. Any transcriptional errors that result from this process are unintentional.

## 2018-01-08 DIAGNOSIS — Z3040 Encounter for surveillance of contraceptives, unspecified: Secondary | ICD-10-CM | POA: Diagnosis not present

## 2018-01-08 DIAGNOSIS — Z01419 Encounter for gynecological examination (general) (routine) without abnormal findings: Secondary | ICD-10-CM | POA: Diagnosis not present

## 2018-01-08 DIAGNOSIS — Z6839 Body mass index (BMI) 39.0-39.9, adult: Secondary | ICD-10-CM | POA: Diagnosis not present

## 2018-01-23 ENCOUNTER — Ambulatory Visit: Payer: 59

## 2018-01-28 ENCOUNTER — Ambulatory Visit: Payer: 59

## 2018-01-28 ENCOUNTER — Telehealth: Payer: Self-pay | Admitting: Family Medicine

## 2018-01-28 DIAGNOSIS — N809 Endometriosis, unspecified: Secondary | ICD-10-CM

## 2018-01-28 DIAGNOSIS — Z30013 Encounter for initial prescription of injectable contraceptive: Secondary | ICD-10-CM

## 2018-01-28 MED ORDER — MEDROXYPROGESTERONE ACETATE 150 MG/ML IM SUSP: 150.0000 mg | INTRAMUSCULAR | 3 refills | Status: DC

## 2018-01-28 NOTE — Telephone Encounter (Signed)
No, I'll recommend that she follow through with the recommendations of her psychiatrist and counselor

## 2018-01-28 NOTE — Telephone Encounter (Signed)
Pt.notified

## 2018-01-28 NOTE — Progress Notes (Signed)
Patient presented to the office today for depo-provera injection received left arm IM. Patient tolerated well and will follow up in three month. [place] # [phone]

## 2018-01-28 NOTE — Telephone Encounter (Signed)
Copied from [place] [phone] [phone]. Topic: General - Other >> Jan 28, 2018  3:16 PM Lennox Solders wrote: Reason for CRM: pt is calling and would like to know if dr lada would prescribe her latuda. This medication was prescribed by [facility] dr Silvio Pate. Pt does not need a refill yet. Pt does not want to have counseling at [facility] and that's the only way they will continue to prescribe latuda

## 2018-02-07 ENCOUNTER — Other Ambulatory Visit: Payer: Self-pay | Admitting: Gastroenterology

## 2018-02-18 ENCOUNTER — Telehealth: Payer: Self-pay | Admitting: Neurology

## 2018-02-18 NOTE — Telephone Encounter (Addendum)
Per vo by Dr. Krista Blue, she may come in for the following IV treatment (with a driver):  1) Depakote 1000mg  IV  2) Toradol 30mg  IV 3) Phenergan 12.5mg  IV  Dr. Krista Blue will allow the IV to be repeated once, if needed, on the following day.  Signed orders provided to [facility].  The patient will come to the office, on 02/19/18, with a driver.

## 2018-02-18 NOTE — Telephone Encounter (Signed)
Pt called stating she has had a migraine off and on for the last week. Pt has tried over the counter medications and prescribed medications, nothing seems to help. Requesting a call to discuss coming in for an infusion

## 2018-02-19 DIAGNOSIS — G43909 Migraine, unspecified, not intractable, without status migrainosus: Secondary | ICD-10-CM | POA: Diagnosis not present

## 2018-02-20 DIAGNOSIS — G43909 Migraine, unspecified, not intractable, without status migrainosus: Secondary | ICD-10-CM | POA: Diagnosis not present

## 2018-02-25 ENCOUNTER — Ambulatory Visit: Payer: 59 | Admitting: Neurology

## 2018-02-25 ENCOUNTER — Encounter: Payer: Self-pay | Admitting: Neurology

## 2018-02-25 VITALS — BP 122/68 | HR 76 | Ht 68.0 in | Wt 201.0 lb

## 2018-02-25 DIAGNOSIS — G43909 Migraine, unspecified, not intractable, without status migrainosus: Secondary | ICD-10-CM | POA: Diagnosis not present

## 2018-02-25 MED ORDER — VENLAFAXINE HCL ER 37.5 MG PO CP24: 37.5000 mg | ORAL_CAPSULE | Freq: Every day | ORAL | 11 refills | Status: DC

## 2018-02-25 MED ORDER — ERENUMAB-AOOE 140 MG/ML ~~LOC~~ SOAJ: 140.0000 mg | SUBCUTANEOUS | 11 refills | Status: DC

## 2018-02-25 NOTE — Progress Notes (Signed)
[facility]  PATIENT: Anita Weaver DOB: 11-Jul-1975  HISTORY OF PRESENT ILLNESS:Anita Weaver is a 42 years old right-handed Caucasian female, accompanied by her husband, referred by her primary care physician Dr. Manuella Ghazi for evaluation of migraine headaches  She reported a history of migraine headaches since age 89, at that time, she also has excessive stress, for a while, she been having headaches daily, since her separation with her previous husband, her headache has much improved, doing very well from 2013-2014, but since October 2014, she began to get migraine again, gradually getting worse, to almost daily basis, she has been taking Excedrin Migraine every day 2-4 tablets, her migraine headaches are usually lateralized severe pounding headaches with associated light noise sensitivity, nauseous, lasting for days. Previously she has tried different preventive medications including Topamax, moderate help, nortriptyline, no help, there was an 1 medication prescribed by her psychologist for her bipolar disorder, Depakote has been very helpful for him migraine Over-the-counter Tylenol, ibuprofen and is no longer helpful, Excedrin migraine only provide mild temporary relief, she has tried triptan treatment in the past, Imitrex causing heart racing, sweaty, difficulty breathing, Maxalt has been very helpful, but she was not able afford it because the high co-pay, Flexeril has dade her sleepy Trigger for her migraines, weather change, stress, caffeine, sleep deprivation  She had tubal ligations now, she also complains of worsening depression over the past few months, will set up appointment with her psychologist, she is not not on any medications  UPDATE August 3rd 2015: She came in with her husband today, She has tried Depakote ER 523m qhs for almost 2 months without helping, worry about side effect, Inderal 49mbid did not help either.  She still complains of headaches 20 days in a  month, Maxalt works well, headache would go away in 30 minutes, she used it up all 15 tabs of Maxalt in one month. She also complains of dizziness, spinning sensation, fall over, slurred speech  MRI of the brain in September 2015 was essentially normal. She is not on any preventive medications now, for a while, her migraines well controlled by Maxalt, but since May 2016, she began to have frequent, almost daily headaches, left retro-orbital area moderate to severe pounding headache with associated light noise sensitivity, she been taking almost daily Maxalt, Which helps her, but only temporarily, She does not want to consider Botox injection as preventive medications, also complains of midline neck pain, achy sensation, no radiating pain, no bilateral upper extremity paresthesia and weakness. Topamax 10023mid works for her somewhat in the past, wants to be back on it  UPDATE May 29 2017: She is accompanied by her husband at today's clinical visit, she complains of frequent migraine headaches, about 14 headaches in a month, usually triggered by caffeine intake, but despite tapering off of her caffeine intake, she has frequent migraine headaches, call office multiple times require IV infusion, this particular headache has been ongoing for 2 weeks, she has tried multiple dose almost daily dose of Maxalt without breaking her headache.  Last iv infusion on Jan 9th, she used up all her 15 tablets of Maxalt each month, call the office multiple times requiring preauthorization, early refill, did respond to Depacon IV infusion along with Toradol, and Phenergan.  UPDATE Feb 25 2018: She started Aimovig 44m64m since May 2019, initially it helped her very well, she barely has any migraine from May to August 2019, with weather change in September 2019, she began to have  frequent migraine headaches again, received IV Depacon infusion on September 4, which helped her migraine very well, is to come back for second IV  infusion on February 18, 2018, but she continue have daily headaches over past 1 week, use daily Maxalt.  Lateralized severe headache with associated light noise sensitivity, nauseous  REVIEW OF SYSTEMS: Full 14 system review of systems performed and notable only for those listed, all others are neg:  Headache, back pain, frequent awakening, daytime sleepiness,  All rest review of the system were negative  ALLERGIES: Allergies  Allergen Reactions  . Imitrex [Sumatriptan] Other (See Comments)    Heart palpitations, hot/cold sweats  . Demerol [Meperidine] Other (See Comments)  . Demerol [Meperidine]   . Flagyl [Metronidazole] Hives    HOME MEDICATIONS: Outpatient Medications Prior to Visit  Medication Sig Dispense Refill  . albuterol (PROAIR HFA) 108 (90 Base) MCG/ACT inhaler Inhale 2 puffs into the lungs every 4 (four) hours as needed for wheezing or shortness of breath. 1 Inhaler 2  . ALPRAZolam (XANAX) 0.5 MG tablet Take 0.5 mg by mouth at bedtime as needed for anxiety.    . DEXILANT 60 MG capsule TAKE 1 CAPSULE BY MOUTH DAILY 30 capsule 11  . Erenumab-aooe (AIMOVIG) 70 MG/ML SOAJ Inject 70 mg into the skin every 30 (thirty) days. 1 pen 11  . ibuprofen (ADVIL,MOTRIN) 800 MG tablet Take 1 tablet (800 mg total) by mouth every 8 (eight) hours as needed. 30 tablet 2  . lurasidone (LATUDA) 40 MG TABS tablet Take 20 mg by mouth daily with breakfast.    . medroxyPROGESTERone (DEPO-PROVERA) 150 MG/ML injection Inject 1 mL (150 mg total) into the muscle every 3 (three) months. 1 mL 3  . ondansetron (ZOFRAN) 4 MG tablet Take one tablet every 8 hours as needed. #20 to last 30 days. For migraines. 20 tablet 11  . ranitidine ([facility] RANITIDINE) 75 MG tablet Take by mouth. Reported on 04/15/2015    . rizatriptan (MAXALT) 10 MG tablet Take 1 tab at onset of migraine.  May repeat once in 2 hrs, if needed.  Max dose: 2 tabs/day. This is a 90 day prescription. 45 tablet 3  . tiZANidine (ZANAFLEX) 4 MG tablet  Take one tablet every 8 hours as needed. #20 to last 30 days.  For migraines. 30 tablet 0  . topiramate (TOPAMAX) 50 MG tablet Take 2 tablets (100 mg total) by mouth 2 (two) times daily. 360 tablet 1  . medroxyPROGESTERone (DEPO-PROVERA) injection 150 mg      No facility-administered medications prior to visit.     PAST MEDICAL HISTORY: Past Medical History:  Diagnosis Date  . Arthritis   . Asthma   . Chronic pelvic pain in female   . Endometriosis   . FH: breast cancer in first degree relative    Mother and sister both are BRCA positive; patient is BRCA negative  . Headache    migraines  . Hyperlipidemia   . Hypertension   . Interstitial cystitis   . Irritable bowel disease   . Neck pain     PAST SURGICAL HISTORY: Past Surgical History:  Procedure Laterality Date  . BREAST BIOPSY Left 02/08/2017  . CHOLECYSTECTOMY    . LAPAROSCOPY  2006  . TUBAL LIGATION      FAMILY HISTORY: Family History  Problem Relation Age of Onset  . Cancer Mother 21       breast  . Breast cancer Mother 96  . Cancer Sister 17  . Breast  cancer Sister 25  . Kidney disease Son   . Heart attack Maternal Grandfather   . Heart attack Paternal Grandmother   . Heart attack Paternal Grandfather     SOCIAL HISTORY: Social History   Socioeconomic History  . Marital status: Married    Spouse name: [name]  . Number of children: 3  . Years of education: 6  . 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 Financial resource strain: Not on file  . Food insecurity:    Worry: Not on file    Inability: Not on file  . Transportation needs:    Medical: Not on file    Non-medical: Not on file  Tobacco Use  . Smoking status: Never Smoker  . Smokeless tobacco: Never Used  Substance and Sexual Activity  . Alcohol use: Yes    Alcohol/week: 0.0 standard drinks    Comment: occasionally  . Drug use: No  . Sexual activity: Yes    Partners: Male     Birth control/protection: Surgical    Comment: tubaligation  Lifestyle  . Physical activity:    Days per week: Not on file    Minutes per session: Not on file  . Stress: Not on file  Relationships  . Social connections:    Talks on phone: Not on file    Gets together: Not on file    Attends religious service: Not on file    Active member of club or organization: Not on file    Attends meetings of clubs or organizations: Not on file    Relationship status: Not on file  . Intimate partner violence:    Fear of current or ex partner: Not on file    Emotionally abused: Not on file    Physically abused: Not on file    Forced sexual activity: Not on file  Other Topics Concern  . Not on file  Social History Narrative   ** Merged History Encounter **       ** Data from: 02/09/14 Enc Dept: [facility]       ** Data from: 11/10/13 Enc Dept: [facility]   Patient lives at home with [name]   Patient works full time.   Right handed.   Caffeine- none   Education- high school     PHYSICAL EXAM  Vitals:   02/25/18 0835  BP: 122/68  Pulse: 76  Weight: 201 lb (91.2 kg)  Height: '5\' 8"'$  (1.727 m)   Body mass index is 30.56 kg/m.  Generalized: Well developed, in no acute distress  Head: normocephalic and atraumatic,. Oropharynx benign  Neck: Supple,  Musculoskeletal: No deformity   Neurological examination   Mentation: Alert oriented to time, place, history taking. Attention span and concentration appropriate. Recent and remote memory intact.  Follows all commands speech and language fluent.  Cranial nerve II-XII: .Pupils were equal round reactive to light extraocular movements were full, visual field were full on confrontational test. Facial sensation and strength were normal. hearing was intact to finger rubbing bilaterally. Uvula tongue midline. Head turning and shoulder shrug were normal and symmetric.Tongue protrusion into cheek strength was  normal. Motor: normal bulk and tone, full strength in the BUE, BLE, fine finger movements normal, no pronator drift. No focal weakness Sensory: normal and symmetric to light touch, pinprick, and  Vibration, in the upper and lower extremities Coordination: finger-nose-finger, heel-to-shin bilaterally, no dysmetria Reflexes: Symmetric upper and  lower plantar responses were flexor bilaterally. Gait and Station: Rising up from seated position without assistance, normal stance,  moderate stride, good arm swing, smooth turning, able to perform tiptoe, and heel walking without difficulty. Tandem gait is steady  DIAGNOSTIC DATA (LABS, IMAGING, TESTING)   ASSESSMENT AND PLAN  42 y.o. year old female   Chronic migraine headaches,  Continue Topamax 50 mg 2 tablets twice a day as migraine prevention  Add on Effexor 37.5 mg  Increase Aimovig to 17m daily as migraine prevention  Maxalt as needed, can combine it together with Zofran, NSAIDs,  YMarcial Pacas M.D. Ph.D.  [facility] [address] [place] [id number]: [phone]:      [phone]

## 2018-02-25 NOTE — Addendum Note (Signed)
Addended by: Marcial Pacas on: 02/25/2018 09:33 AM   Modules accepted: Level of Service

## 2018-02-26 ENCOUNTER — Telehealth: Payer: Self-pay | Admitting: Neurology

## 2018-02-26 NOTE — Telephone Encounter (Addendum)
She wanted to know the name of the antidepressant that was sent to the pharmacy.  Per her chart, Dr. Krista Blue added venlafaxine 37.5mg  to her medication regimen.  She also wanted to know the name of the previously tried antidepressant that caused her to have dry mouth.  She was on nortriptyline in the past.  She said Dr. Krista Blue had offered her another repeat infusion on 02/25/18 to see if it would break the stubborn headache cycle she is currently in right now. There were no available infusion appts that day.  She would like to come in today.  Per vo by Dr. Krista Blue, she may come in for the following IV treatment (with a driver):  1)Depakote 1000mg IV  2)Toradol 30mg IV  Signed orders provided to [facility].  The patient will arrive this afternoon with a driver.

## 2018-02-26 NOTE — Telephone Encounter (Signed)
Pt requesting a call stating she would like to discuss her anti depressant medication (unsure of name) and would like to discuss schedule and infusion

## 2018-03-25 ENCOUNTER — Other Ambulatory Visit: Payer: Self-pay | Admitting: Neurology

## 2018-04-15 ENCOUNTER — Ambulatory Visit (INDEPENDENT_AMBULATORY_CARE_PROVIDER_SITE_OTHER): Payer: 59 | Admitting: Obstetrics & Gynecology

## 2018-04-15 VITALS — BP 123/85 | HR 72

## 2018-04-15 DIAGNOSIS — Z30013 Encounter for initial prescription of injectable contraceptive: Secondary | ICD-10-CM

## 2018-04-15 NOTE — Progress Notes (Signed)
Patient presented to the office today for her depo-provera 150 mg injection received in right arm IM. Patient tolerated well and will follow up in three month for her next appointment. [facility]# [phone]

## 2018-04-16 ENCOUNTER — Other Ambulatory Visit: Payer: Self-pay | Admitting: *Deleted

## 2018-04-16 ENCOUNTER — Telehealth: Payer: Self-pay | Admitting: Neurology

## 2018-04-16 DIAGNOSIS — G43709 Chronic migraine without aura, not intractable, without status migrainosus: Secondary | ICD-10-CM

## 2018-04-16 MED ORDER — RIZATRIPTAN BENZOATE 10 MG PO TABS: ORAL_TABLET | ORAL | 3 refills | Status: DC

## 2018-04-16 NOTE — Telephone Encounter (Signed)
Pt has requested a call back to discuss getting on a different type of medication. Please advise.

## 2018-04-16 NOTE — Telephone Encounter (Signed)
Returned call to Anita Weaver.  She has been experiencing an increase in migraines.  She recently had some medication changes.  Her Aimovig was increased from 70mg  monthly to 140mg  monthly.  She has just taken her second 140mg  injection.  Also, she was prescribed venlafaxine XR 37.5mg , one tablet per day. She has never started this medication.    She is going to try the increased dose of Aimovig for a third month and start the venlafaxine.  She will call back in a few weeks if she does not see any improvement in migraine frequency.

## 2018-04-16 NOTE — Addendum Note (Signed)
Addended by: Phillip Heal, Tetsuo Coppola A on: 04/16/2018 02:02 PM   Modules accepted: Level of Service

## 2018-04-23 ENCOUNTER — Telehealth: Payer: Self-pay | Admitting: Neurology

## 2018-04-23 NOTE — Telephone Encounter (Signed)
Pt states she is in the middle of a migraine cycle and is asking to be scheduled for an infusion.  Please call

## 2018-04-23 NOTE — Telephone Encounter (Signed)
Per Dr.  Krista Blue, ok for Depacon 1gm, Toradol 30mg  IV. May repeat infusion once if h/a persists. I spoke with Lam and let her know orders placed in [facility]'s inbox, so that Otila Kluver can call her iin the am to schedule. She verbalized understanding of same, sts. if Otila Kluver calls her in the am, please call her at her work # [phone]

## 2018-04-23 NOTE — Telephone Encounter (Signed)
Spoke with Anita Weaver. She sts. she has had increased h/a's for the last 2 wks. Rizatriptam not helping. Sts. has had 2 Aimovig 140mg  inj. and is due for a 3rd inj. on 1/27. Sts. taking Effexor as rx'd. She would like a migraine infusion. Will check with YY and call her back/fim

## 2018-04-24 ENCOUNTER — Telehealth: Payer: Self-pay | Admitting: Neurology

## 2018-04-24 DIAGNOSIS — G43909 Migraine, unspecified, not intractable, without status migrainosus: Secondary | ICD-10-CM | POA: Diagnosis not present

## 2018-04-24 NOTE — Telephone Encounter (Signed)
Transferred call to [facility].

## 2018-04-25 DIAGNOSIS — G43909 Migraine, unspecified, not intractable, without status migrainosus: Secondary | ICD-10-CM | POA: Diagnosis not present

## 2018-05-05 DIAGNOSIS — B349 Viral infection, unspecified: Secondary | ICD-10-CM | POA: Diagnosis not present

## 2018-05-05 DIAGNOSIS — R509 Fever, unspecified: Secondary | ICD-10-CM | POA: Diagnosis not present

## 2018-05-20 ENCOUNTER — Ambulatory Visit (INDEPENDENT_AMBULATORY_CARE_PROVIDER_SITE_OTHER): Payer: 59 | Admitting: Physician Assistant

## 2018-05-20 ENCOUNTER — Encounter: Payer: Self-pay | Admitting: Physician Assistant

## 2018-05-20 ENCOUNTER — Other Ambulatory Visit: Payer: Self-pay

## 2018-05-20 VITALS — BP 120/84 | HR 78 | Temp 98.2°F | Resp 16 | Ht 68.0 in | Wt 193.0 lb

## 2018-05-20 DIAGNOSIS — M7542 Impingement syndrome of left shoulder: Secondary | ICD-10-CM | POA: Diagnosis not present

## 2018-05-20 DIAGNOSIS — G43909 Migraine, unspecified, not intractable, without status migrainosus: Secondary | ICD-10-CM | POA: Diagnosis not present

## 2018-05-20 DIAGNOSIS — M7541 Impingement syndrome of right shoulder: Secondary | ICD-10-CM | POA: Diagnosis not present

## 2018-05-20 DIAGNOSIS — F3175 Bipolar disorder, in partial remission, most recent episode depressed: Secondary | ICD-10-CM

## 2018-05-20 MED ORDER — MELOXICAM 7.5 MG PO TABS: ORAL_TABLET | ORAL | 0 refills | Status: DC

## 2018-05-20 NOTE — Progress Notes (Signed)
Patient: Anita Weaver, Female    DOB: 09-Dec-1975, 43 y.o.   MRN: [id number] Visit Date: 05/24/2018  Today's Provider: Trinna Post, PA-C   Chief Complaint  Patient presents with  . New Patient (Initial Visit)  . Arm Pain   Subjective:     Annual physical exam Anita Weaver is a 43 y.o. female who presents today to establish care. Patient is transferring from [facility]. 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 She is married and has three grown children.   Bipolar Disorder   She is followed by psychiatry at the [facility] in [place]. She was diagnosed 8 years ago. She has never been hospitalized before. She is taking [place] currently. She is trying to switch psychiatrists because she reports the [facility] has recently changed its policy so that it requires patients to get counseling. She reports she does not need counseling and as such is in the process of transferring psychiatrists. She has contacted [facility] and reports they have told her she can be scheduled but she is awaiting an appointment. She asks if she is able to get Latuda at this clinic.   Migraines: She has migraines and is followed by Dr. Krista Weaver at [facility] in [place]. She is currently on Aimovig, topamax, effecor, and has zofran for nausea. She reports she sometimes needs IV infusions for her migraines.    Patient is c/o bilateral arm pain x's 1 year. Patient reports arm pain is worsening and reports problems with arm movement. Patient also c/o of neck and shoulder blade burning sensation x's several years. Patient concerned about muscle problems. She reports difficulty raising her arms overhead and getting dressed. She denies any specific injuries.   10/25/2018 PAP normal, currently on Depo shot, sees OBGYN at center for [facility].    -----------------------------------------------------------------   Review of Systems  Constitutional: Negative.   HENT: Negative.   Eyes: Negative.   Respiratory: Negative.   Cardiovascular: Negative.   Gastrointestinal: Negative.   Endocrine: Negative.   Genitourinary: Negative.   Musculoskeletal: Positive for myalgias and neck pain.  Skin: Negative.   Allergic/Immunologic: Negative.   Neurological: Negative.   Hematological: Negative.   Psychiatric/Behavioral: Negative.     Social History      She  reports that she has never smoked. She has never used smokeless tobacco. She reports current alcohol use. She reports that she does not use drugs.       Social History   Socioeconomic History  . Marital status: Married    Spouse name: Anita Weaver  . Number of children: 3  . Years of education: 44  . 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 Financial resource strain: Not on file  . Food insecurity:    Worry: Not on file    Inability: Not on file  . Transportation needs:    Medical: Not on file    Non-medical: Not on file  Tobacco Use  . Smoking status: Never Smoker  . Smokeless tobacco: Never Used  Substance and Sexual Activity  . Alcohol use: Yes    Alcohol/week: 0.0 standard drinks    Comment: occasionally  . Drug use: No  . Sexual activity: Yes    Partners: Male    Birth control/protection: Surgical    Comment: tubaligation  Lifestyle  . Physical activity:  Days per week: Not on file    Minutes per session: Not on file  . Stress: Not on file  Relationships  . Social connections:    Talks on phone: Not on file    Gets together: Not on file    Attends religious service: Not on file    Active member of club or organization: Not on file    Attends meetings of clubs or organizations: Not on file    Relationship status: Not on file  Other Topics Concern  . Not on file  Social History Narrative   **  Merged History Encounter **       ** Data from: 02/09/14 Enc Dept: [facility]       ** Data from: 11/10/13 Enc Dept: [facility]   Patient lives at home with her husband Anita Weaver)   Patient works full time.   Right handed.   Caffeine- none   Education- high school    Past Medical History:  Diagnosis Date  . Allergy   . Arthritis   . Asthma   . Chronic pelvic pain in female   . Endometriosis   . FH: breast cancer in first degree relative    Mother and sister both are BRCA positive; patient is BRCA negative  . GERD (gastroesophageal reflux disease)   . Headache    migraines  . Heart murmur   . Hyperlipidemia   . Hypertension   . Interstitial cystitis   . Irritable bowel disease   . Neck pain      Patient Active Problem List   Diagnosis Date Noted  . Asthma 04/27/2017  . Acid reflux 04/27/2017  . Hiatal hernia with GERD 04/27/2017  . Contact dermatitis 05/15/2016  . Fatigue 01/20/2016  . Mild dysplasia of cervix (CIN I) 09/29/2014  . Pap smear abnormality of cervix with LGSIL 09/21/2014  . Migraine headache 05/08/2013  . Bipolar disorder, unspecified ([place]) 05/08/2013  . Endometriosis 04/18/2012  . Chronic pelvic pain in female 04/18/2012  . Female genital symptoms 04/18/2012    Past Surgical History:  Procedure Laterality Date  . BREAST BIOPSY Left 02/08/2017  . CHOLECYSTECTOMY    . LAPAROSCOPY  2006  . TUBAL LIGATION      Family History        Family Status  Relation Name Status  . Mother  Alive  . Father  Alive  . Sister  Alive  . Son  Alive  . MGM  Alive  . MGF  Deceased       heart attack  . PGM  Deceased       heart attack  . PGF  Deceased       heart attack  . Son  Alive  . Son  Alive        Her family history includes Breast cancer (age of onset: 54) in her sister; Breast cancer (age of onset: 43) in her mother; Cancer (age of onset: 78) in her sister; Cancer (age of onset: 58) in her mother; Heart attack in her maternal  grandfather, paternal grandfather, and paternal grandmother; Kidney disease in her son.      Allergies  Allergen Reactions  . Imitrex [Sumatriptan] Other (See Comments)    Heart palpitations, hot/cold sweats  . Demerol [Meperidine] Other (See Comments)  . Demerol [Meperidine]   . Flagyl [Metronidazole] Hives     Current Outpatient Medications:  .  albuterol (PROAIR HFA) 108 (90 Base) MCG/ACT inhaler, Inhale 2 puffs into the lungs  every 4 (four) hours as needed for wheezing or shortness of breath., Disp: 1 Inhaler, Rfl: 2 .  DEXILANT 60 MG capsule, TAKE 1 CAPSULE BY MOUTH DAILY, Disp: 30 capsule, Rfl: 11 .  Erenumab-aooe (AIMOVIG) 140 MG/ML SOAJ, Inject 140 mg into the skin every 30 (thirty) days., Disp: 1 pen, Rfl: 11 .  ibuprofen (ADVIL,MOTRIN) 800 MG tablet, Take 1 tablet (800 mg total) by mouth every 8 (eight) hours as needed., Disp: 30 tablet, Rfl: 2 .  lurasidone (LATUDA) 40 MG TABS tablet, Take 20 mg by mouth daily. , Disp: , Rfl:  .  medroxyPROGESTERone (DEPO-PROVERA) 150 MG/ML injection, Inject 1 mL (150 mg total) into the muscle every 3 (three) months., Disp: 1 mL, Rfl: 3 .  ondansetron (ZOFRAN) 4 MG tablet, Take one tablet every 8 hours as needed. #20 to last 30 days. For migraines., Disp: 20 tablet, Rfl: 11 .  ranitidine ([employer]S RANITIDINE) 75 MG tablet, Take by mouth. Reported on 04/15/2015, Disp: , Rfl:  .  rizatriptan (MAXALT) 10 MG tablet, Take 1 tab at onset of migraine.  May repeat once in 2 hrs, if needed.  Max dose: 2 tabs/day. This is a 90 day prescription., Disp: 45 tablet, Rfl: 3 .  topiramate (TOPAMAX) 50 MG tablet, Take 2 tablets (100 mg total) by mouth 2 (two) times daily., Disp: 360 tablet, Rfl: 1 .  venlafaxine XR (EFFEXOR-XR) 37.5 MG 24 hr capsule, TAKE 1 CAPSULE (37.5 MG TOTAL) BY MOUTH DAILY WITH BREAKFAST., Disp: 90 capsule, Rfl: 4 .  meloxicam (MOBIC) 7.5 MG tablet, Take 1 -2 tablets daily PRN for two weeks., Disp: 30 tablet, Rfl: 0   Patient Care Team: Paulene Floor as PCP - General (Physician Assistant) System, Pcp Not In Arnetha Courser, MD as Attending Physician (Family Medicine) Marcial Pacas, MD as Consulting Physician (Neurology) Delsa Bern, MD as Consulting Physician (Obstetrics and Gynecology) Lucilla Lame, MD as Consulting Physician (Gastroenterology)    Objective:    Vitals: BP 120/84 (BP Location: Left Arm, Patient Position: Sitting, Cuff Size: Normal)   Pulse 78   Temp 98.2 F (36.8 C) (Oral)   Resp 16   Ht 5' 8"  (1.727 m)   Wt 193 lb (87.5 kg)   BMI 29.35 kg/m    Vitals:   05/20/18 1517  BP: 120/84  Pulse: 78  Resp: 16  Temp: 98.2 F (36.8 C)  TempSrc: Oral  Weight: 193 lb (87.5 kg)  Height: 5' 8"  (1.727 m)     Physical Exam Constitutional:      Appearance: Normal appearance.  Cardiovascular:     Rate and Rhythm: Normal rate and regular rhythm.     Heart sounds: Normal heart sounds.  Pulmonary:     Effort: Pulmonary effort is normal.     Breath sounds: Normal breath sounds.  Musculoskeletal:     Comments: Difficulty with shoulder abduction past 90 degrees bilaterally. Pain with lift off test bilaterally. No tenderness to palpation along clavicle or glenohumeral joint. No crepitus appreciated.   Skin:    General: Skin is warm and dry.  Neurological:     Mental Status: She is alert and oriented to person, place, and time. Mental status is at baseline.  Psychiatric:        Mood and Affect: Mood normal.        Behavior: Behavior normal.      Depression Screen PHQ 2/9 Scores 05/20/2018 04/27/2017 12/26/2016 09/01/2016  PHQ - 2 Score 0 0 0 0  PHQ- 9 Score 3 - - -       Assessment & Plan:     Routine Health Maintenance and Physical Exam  Exercise Activities and Dietary recommendations Goals   None      There is no immunization history on file for this patient.  Health Maintenance  Topic Date Due  . HIV Screening  05/22/1990  . TETANUS/TDAP  05/22/1994  . INFLUENZA VACCINE   11/08/2017  . PAP SMEAR-Modifier  10/25/2018     Discussed health benefits of physical activity, and encouraged her to engage in regular exercise appropriate for her age and condition.    1. Impingement syndrome of both shoulders  Counseled on conservative measures including physical therapy and anti-inflammatories. She does not want to pursue PT at this point. If oral anti-inflammatories fail, patient will get xrays and return to clinic for subacromial injections.   - meloxicam (MOBIC) 7.5 MG tablet; Take 1 -2 tablets daily PRN for two weeks.  Dispense: 30 tablet; Refill: 0 - DG Shoulder Left; Future - DG Shoulder Right; Future  2. Bipolar disorder, in partial remission, most recent episode depressed ([place])  [facility] Have advised patient that I do not feel comfortable prescribing and monitoring Latuda, suggest she establish with new psychiatrist.   3. Migraine without status migrainosus, not intractable, unspecified migraine type  Followed by Dr. Krista Weaver an [facility].  Return if symptoms worsen or fail to improve.  The entirety of the information documented in the History of Present Illness, Review of Systems and Physical Exam were personally obtained by me. Portions of this information were initially documented by Lynford Humphrey, CMA and reviewed by me for thoroughness and accuracy.   --------------------------------------------------------------------    Anita Post, PA-C  [facility]

## 2018-05-24 NOTE — Patient Instructions (Signed)
Secondary Shoulder Impingement Syndrome    Shoulder impingement syndrome is a condition that causes pain when connective tissues (tendons) surrounding the shoulder joint become pinched. These tendons are part of the group of muscles and tissues that help to stabilize the shoulder (rotator cuff). There are two types of impingement syndrome: primary and secondary.  Secondary impingement syndrome occurs when movement of the shoulder joint is abnormal. This can happen if there is too much movement (laxity), too little movement (stiffness), or abnormal movement.  What are the causes?  This condition may be caused by:   Shoulder blade muscles that are weak or uncoordinated (scapular dyskinesis).   Glenohumeral instability. This is too much movement of the upper arm bone (humerus). This can result from:  ? Having loose joints.  ? An injury that happened during repeated overhead arm movements, such as throwing.   A hard, direct hit (blow) to the shoulder. This is rare.  What increases the risk?  You may be more likely to develop this condition if you have injured your shoulder in the past or if you are an athlete who participates in:   Sports that involve throwing, such as baseball.   Tennis.   Swimming.   Volleyball.  What are the signs or symptoms?  The main symptom of this condition is pain on the front or side of the shoulder. Pain may:   Get worse when lifting or raising the arm.   Get worse at night.   Wake you up from sleeping.   Feel sharp when the shoulder is moved, and then fade to an ache.  Other signs and symptoms may include:   Tenderness.   Stiffness.   Inability to raise the arm above shoulder level or behind the body.   Weakness.  How is this diagnosed?  This condition may be diagnosed based on:   Your symptoms.   Your medical history.   A physical exam.   Imaging tests, such as:  ? X-rays.  ? MRI.  ? Ultrasound.  How is this treated?  Treatment for this condition may include:   Resting  your shoulder and avoiding all activities that cause pain or put stress on the shoulder.   Icing your shoulder.   NSAIDs to help reduce pain and swelling.   One or more injections of medicines to numb the area and reduce inflammation.   Physical therapy.   Surgery. This may be needed if nonsurgical treatments do not help. Surgery may involve stabilizing your shoulder and repairing your rotator cuff, as needed.  Follow these instructions at home:  Managing pain, stiffness, and swelling     If directed, put ice on the injured area.  ? Put ice in a plastic bag.  ? Place a towel between your skin and the bag.  ? Leave the ice on for 20 minutes, 2-3 times a day.  Activity   Rest and return to your normal activities as told by your health care provider. Ask your health care provider what activities are safe for you.   Do exercises as told by your health care provider.  General instructions   Do not use any tobacco products, including cigarettes, chewing tobacco, or e-cigarettes. Tobacco can delay healing. If you need help quitting, ask your health care provider.   Ask your health care provider when it is safe for you to drive.   Take over-the-counter and prescription medicines only as told by your health care provider.   Keep all follow-up visits   as told by your health care provider. This is important.  How is this prevented?   Give your body time to rest between periods of activity.   Maintain physical fitness, including strength in your shoulder muscles and back muscles.  Contact a health care provider if:   Your symptoms have not improved after 2-3 months of treatment.   Your symptoms are getting worse.  This information is not intended to replace advice given to you by your health care provider. Make sure you discuss any questions you have with your health care provider.  Document Released: 03/27/2005 Document Revised: 12/02/2015 Document Reviewed: 02/27/2015  Elsevier Interactive Patient Education   2019 Elsevier Inc.

## 2018-06-11 ENCOUNTER — Ambulatory Visit
Admission: RE | Admit: 2018-06-11 | Discharge: 2018-06-11 | Disposition: A | Discharge status: Home / Self Care | Payer: 59 | Attending: Physician Assistant | Admitting: Physician Assistant

## 2018-06-11 ENCOUNTER — Ambulatory Visit
Admission: RE | Admit: 2018-06-11 | Discharge: 2018-06-11 | Disposition: A | Discharge status: Home / Self Care | Payer: 59 | Source: Ambulatory Visit | Attending: Physician Assistant | Admitting: Physician Assistant

## 2018-06-11 ENCOUNTER — Other Ambulatory Visit: Payer: Self-pay

## 2018-06-11 DIAGNOSIS — M7541 Impingement syndrome of right shoulder: Secondary | ICD-10-CM | POA: Diagnosis not present

## 2018-06-11 DIAGNOSIS — M25519 Pain in unspecified shoulder: Secondary | ICD-10-CM | POA: Diagnosis not present

## 2018-06-11 DIAGNOSIS — M7542 Impingement syndrome of left shoulder: Principal | ICD-10-CM

## 2018-06-11 DIAGNOSIS — M25511 Pain in right shoulder: Secondary | ICD-10-CM | POA: Diagnosis not present

## 2018-06-12 ENCOUNTER — Telehealth: Payer: Self-pay

## 2018-06-12 NOTE — Telephone Encounter (Signed)
See result notes. 

## 2018-06-12 NOTE — Telephone Encounter (Signed)
-----   Message from Trinna Post, [place] sent at 06/12/2018  3:58 PM EST ----- Both shoulders normal without arthritis or fracture. If she wants bursitis injections be sure to schedule her when we have the steroids in as we are out I believe. Make it 40 min if you can please.

## 2018-06-12 NOTE — Telephone Encounter (Signed)
Pt is calling for the results of her X-ray yesterday.    Thanks,   -[name]

## 2018-06-12 NOTE — Telephone Encounter (Signed)
-----   Message from Trinna Post, [place] sent at 06/12/2018  3:57 PM EST ----- Both shoulders normal without arthritis or fracture. If she wants bursitis injections be sure to schedule her when we have the steroids in as we are out I believe. Make it 40 min if you can please.

## 2018-06-12 NOTE — Telephone Encounter (Signed)
Patient advised and scheduled.  

## 2018-06-17 ENCOUNTER — Other Ambulatory Visit: Payer: Self-pay | Admitting: Physician Assistant

## 2018-06-17 DIAGNOSIS — M7541 Impingement syndrome of right shoulder: Secondary | ICD-10-CM

## 2018-06-17 DIAGNOSIS — M7542 Impingement syndrome of left shoulder: Principal | ICD-10-CM

## 2018-06-24 ENCOUNTER — Encounter: Payer: Self-pay | Admitting: Physician Assistant

## 2018-06-24 ENCOUNTER — Other Ambulatory Visit: Payer: Self-pay

## 2018-06-24 ENCOUNTER — Ambulatory Visit: Payer: Self-pay | Admitting: Physician Assistant

## 2018-06-24 ENCOUNTER — Ambulatory Visit (INDEPENDENT_AMBULATORY_CARE_PROVIDER_SITE_OTHER): Payer: 59 | Admitting: Physician Assistant

## 2018-06-24 VITALS — BP 123/93 | HR 86 | Temp 98.2°F | Resp 16 | Wt 193.0 lb

## 2018-06-24 DIAGNOSIS — M7542 Impingement syndrome of left shoulder: Secondary | ICD-10-CM | POA: Diagnosis not present

## 2018-06-24 DIAGNOSIS — M7541 Impingement syndrome of right shoulder: Secondary | ICD-10-CM

## 2018-06-24 MED ORDER — LIDOCAINE HCL (PF) 1 % IJ SOLN
2.0000 mL | Freq: Once | INTRAMUSCULAR | Status: AC
  Administered 2018-06-24: 2 mL

## 2018-06-24 MED ORDER — METHYLPREDNISOLONE ACETATE 40 MG/ML IJ SUSP
40.0000 mg | Freq: Once | INTRAMUSCULAR | Status: AC
  Administered 2018-06-24: 40 mg

## 2018-06-24 NOTE — Progress Notes (Signed)
Patient: Anita Weaver Female    DOB: 1976/01/23   43 y.o.   MRN: [id number] Visit Date: 06/24/2018  Today's Provider: Trinna Post, PA-C   Chief Complaint  Patient presents with  . Impingement syndrome of both shoulder   Subjective:     HPI  Patient coming in for Bursitis Injections, bilateral shoulders. Has failed conservative therapy with NSAIDs and desires injections. Reports continued difficulty with overhead motion, putting on jackets. Bilateral shoulder xrays from 06/12/2018 were normal and showed no fracture or arthritis.   Allergies  Allergen Reactions  . Imitrex [Sumatriptan] Other (See Comments)    Heart palpitations, hot/cold sweats  . Demerol [Meperidine] Other (See Comments)  . Demerol [Meperidine]   . Flagyl [Metronidazole] Hives     Current Outpatient Medications:  .  albuterol (PROAIR HFA) 108 (90 Base) MCG/ACT inhaler, Inhale 2 puffs into the lungs every 4 (four) hours as needed for wheezing or shortness of breath., Disp: 1 Inhaler, Rfl: 2 .  DEXILANT 60 MG capsule, TAKE 1 CAPSULE BY MOUTH DAILY, Disp: 30 capsule, Rfl: 11 .  Erenumab-aooe (AIMOVIG) 140 MG/ML SOAJ, Inject 140 mg into the skin every 30 (thirty) days., Disp: 1 pen, Rfl: 11 .  ibuprofen (ADVIL,MOTRIN) 800 MG tablet, Take 1 tablet (800 mg total) by mouth every 8 (eight) hours as needed., Disp: 30 tablet, Rfl: 2 .  lurasidone (LATUDA) 40 MG TABS tablet, Take 20 mg by mouth daily. , Disp: , Rfl:  .  medroxyPROGESTERone (DEPO-PROVERA) 150 MG/ML injection, Inject 1 mL (150 mg total) into the muscle every 3 (three) months., Disp: 1 mL, Rfl: 3 .  meloxicam (MOBIC) 7.5 MG tablet, Take 1 -2 tablets daily PRN for two weeks., Disp: 30 tablet, Rfl: 0 .  ondansetron (ZOFRAN) 4 MG tablet, Take one tablet every 8 hours as needed. #20 to last 30 days. For migraines., Disp: 20 tablet, Rfl: 11 .  ranitidine (CVS RANITIDINE) 75 MG tablet, Take by mouth. Reported on 04/15/2015, Disp: , Rfl:  .  rizatriptan  (MAXALT) 10 MG tablet, Take 1 tab at onset of migraine.  May repeat once in 2 hrs, if needed.  Max dose: 2 tabs/day. This is a 90 day prescription., Disp: 45 tablet, Rfl: 3 .  topiramate (TOPAMAX) 50 MG tablet, Take 2 tablets (100 mg total) by mouth 2 (two) times daily., Disp: 360 tablet, Rfl: 1 .  venlafaxine XR (EFFEXOR-XR) 37.5 MG 24 hr capsule, TAKE 1 CAPSULE (37.5 MG TOTAL) BY MOUTH DAILY WITH BREAKFAST., Disp: 90 capsule, Rfl: 4  Current Facility-Administered Medications:  .  lidocaine (PF) (XYLOCAINE) 1 % injection 2 mL, 2 mL, Other, Once, Pollak, Adriana M, PA-C .  lidocaine (PF) (XYLOCAINE) 1 % injection 2 mL, 2 mL, Other, Once, Pollak, Adriana M, PA-C .  lidocaine (PF) (XYLOCAINE) 1 % injection 2 mL, 2 mL, Other, Once, Pollak, Adriana M, PA-C .  lidocaine (PF) (XYLOCAINE) 1 % injection 2 mL, 2 mL, Other, Once, Pollak, Adriana M, PA-C .  methylPREDNISolone acetate (DEPO-MEDROL) injection 40 mg, 40 mg, Other, Once, Pollak, Adriana M, PA-C .  methylPREDNISolone acetate (DEPO-MEDROL) injection 40 mg, 40 mg, Other, Once, Pollak, Adriana M, PA-C  Review of Systems  Social History   Tobacco Use  . Smoking status: Never Smoker  . Smokeless tobacco: Never Used  Substance Use Topics  . Alcohol use: Yes    Alcohol/week: 0.0 standard drinks    Comment: occasionally      Objective:  BP (!) 123/93 (BP Location: Right Arm, Patient Position: Sitting, Cuff Size: Large)   Pulse 86   Temp 98.2 F (36.8 C) (Oral)   Resp 16   Wt 193 lb (87.5 kg)   BMI 29.35 kg/m  Vitals:   06/24/18 1557  BP: (!) 123/93  Pulse: 86  Resp: 16  Temp: 98.2 F (36.8 C)  TempSrc: Oral  Weight: 193 lb (87.5 kg)     Physical Exam Constitutional:      Appearance: Normal appearance.  Musculoskeletal:     Comments: Pain with shoulder abduction past 90 degrees bilaterally. Hawkins and Neer's positive bilaterally.   Skin:    General: Skin is warm and dry.  Neurological:     Mental Status: She is alert  and oriented to person, place, and time. Mental status is at baseline.  Psychiatric:        Mood and Affect: Mood normal.        Behavior: Behavior normal.    Shoulder Injection Procedure Note  Pre-operative Diagnosis: bilateral Impingement syndrome  Post-operative Diagnosis: same  Indications: Pain 2/2 bilateral impingement syndrome  Anesthesia: Lidocaine 1% without epinephrine without added sodium bicarbonate  Procedure Details   Verbal consent was obtained for the procedure, all risks and benefits explained. The shoulder was prepped with iodine and the skin was anesthetized. Using a 22 gauge needle the bilateral subacromial spaces are injected with 4 mL 1% lidocaine and 1 mL of 40 mg depomedrol under the posterior aspect of the acromion. The injection site was cleansed with topical isopropyl alcohol and a dressing was applied.  Complications:  None; patient tolerated the procedure well.       Assessment & Plan    1. Impingement syndrome of both shoulders  I have reviewed xrays personally and agree with findings. Counseled patient on soreness and possibility of steroid flare post procedure. May need orthopedics or physical therapy if this is not helpful.   - methylPREDNISolone acetate (DEPO-MEDROL) injection 40 mg - methylPREDNISolone acetate (DEPO-MEDROL) injection 40 mg - lidocaine (PF) (XYLOCAINE) 1 % injection 2 mL - lidocaine (PF) (XYLOCAINE) 1 % injection 2 mL - lidocaine (PF) (XYLOCAINE) 1 % injection 2 mL - lidocaine (PF) (XYLOCAINE) 1 % injection 2 mL  F/u PRN.      Trinna Post, PA-C  [facility]

## 2018-06-24 NOTE — Patient Instructions (Signed)

## 2018-06-28 ENCOUNTER — Other Ambulatory Visit: Payer: Self-pay | Admitting: Neurology

## 2018-06-28 DIAGNOSIS — G43709 Chronic migraine without aura, not intractable, without status migrainosus: Secondary | ICD-10-CM

## 2018-07-03 ENCOUNTER — Ambulatory Visit: Payer: 59

## 2018-07-03 ENCOUNTER — Other Ambulatory Visit: Payer: Self-pay

## 2018-07-03 ENCOUNTER — Ambulatory Visit (INDEPENDENT_AMBULATORY_CARE_PROVIDER_SITE_OTHER): Payer: 59 | Admitting: *Deleted

## 2018-07-03 VITALS — BP 134/98 | HR 73

## 2018-07-03 DIAGNOSIS — Z30013 Encounter for initial prescription of injectable contraceptive: Secondary | ICD-10-CM | POA: Diagnosis not present

## 2018-07-03 MED ORDER — MEDROXYPROGESTERONE ACETATE 150 MG/ML IM SUSP
150.0000 mg | Freq: Once | INTRAMUSCULAR | Status: AC
  Administered 2018-07-03: 150 mg via INTRAMUSCULAR

## 2018-07-03 NOTE — Progress Notes (Signed)
Date last pap: 11/2016. Last Depo-Provera: 04/15/2018. Side Effects if any: none. Serum HCG indicated? NA. Depo-Provera 150 mg IM given by: Crosby Oyster, RN. Next appointment due June 10-Jun 24th 2020.

## 2018-07-03 NOTE — Progress Notes (Signed)
I have reviewed the chart and agree with nursing staff's documentation of this patient's encounter.  Verita Schneiders, MD 07/03/2018 4:27 PM

## 2018-07-08 ENCOUNTER — Telehealth: Payer: Self-pay | Admitting: Neurology

## 2018-07-08 NOTE — Telephone Encounter (Signed)
Pt called to ask about her infusions. She did not want to be transferred to the Infusion Suite. She would like to speak to RN. Pt states that if RN is to call before 2:30pm she is to be called at work and if its after 2:30pm call cell phone number.

## 2018-07-09 ENCOUNTER — Ambulatory Visit (INDEPENDENT_AMBULATORY_CARE_PROVIDER_SITE_OTHER): Payer: 59 | Admitting: Psychiatry

## 2018-07-09 ENCOUNTER — Encounter: Payer: Self-pay | Admitting: Neurology

## 2018-07-09 ENCOUNTER — Other Ambulatory Visit: Payer: Self-pay

## 2018-07-09 ENCOUNTER — Encounter: Payer: Self-pay | Admitting: Psychiatry

## 2018-07-09 ENCOUNTER — Ambulatory Visit: Payer: 59 | Admitting: Neurology

## 2018-07-09 VITALS — BP 136/93 | HR 84 | Ht 68.0 in | Wt 190.0 lb

## 2018-07-09 DIAGNOSIS — M1990 Unspecified osteoarthritis, unspecified site: Secondary | ICD-10-CM | POA: Insufficient documentation

## 2018-07-09 DIAGNOSIS — F3130 Bipolar disorder, current episode depressed, mild or moderate severity, unspecified: Secondary | ICD-10-CM

## 2018-07-09 DIAGNOSIS — K589 Irritable bowel syndrome without diarrhea: Secondary | ICD-10-CM | POA: Insufficient documentation

## 2018-07-09 DIAGNOSIS — G43709 Chronic migraine without aura, not intractable, without status migrainosus: Secondary | ICD-10-CM | POA: Diagnosis not present

## 2018-07-09 DIAGNOSIS — G4700 Insomnia, unspecified: Secondary | ICD-10-CM | POA: Diagnosis not present

## 2018-07-09 DIAGNOSIS — G43909 Migraine, unspecified, not intractable, without status migrainosus: Secondary | ICD-10-CM | POA: Diagnosis not present

## 2018-07-09 DIAGNOSIS — M419 Scoliosis, unspecified: Secondary | ICD-10-CM | POA: Insufficient documentation

## 2018-07-09 DIAGNOSIS — N3010 Interstitial cystitis (chronic) without hematuria: Secondary | ICD-10-CM | POA: Insufficient documentation

## 2018-07-09 MED ORDER — DICLOFENAC POTASSIUM(MIGRAINE) 50 MG PO PACK: 50.0000 mg | PACK | ORAL | 11 refills | Status: DC | PRN

## 2018-07-09 MED ORDER — MIRTAZAPINE 7.5 MG PO TABS: 7.5000 mg | ORAL_TABLET | Freq: Every day | ORAL | 0 refills | Status: DC

## 2018-07-09 MED ORDER — SUMATRIPTAN SUCCINATE 6 MG/0.5ML ~~LOC~~ SOLN: 6.0000 mg | SUBCUTANEOUS | 6 refills | Status: DC | PRN

## 2018-07-09 MED ORDER — LURASIDONE HCL 40 MG PO TABS: 40.0000 mg | ORAL_TABLET | Freq: Every day | ORAL | 3 refills | Status: DC

## 2018-07-09 MED ORDER — ONDANSETRON HCL 4 MG PO TABS: ORAL_TABLET | ORAL | 11 refills | Status: DC

## 2018-07-09 MED ORDER — TIZANIDINE HCL 4 MG PO TABS: 4.0000 mg | ORAL_TABLET | Freq: Four times a day (QID) | ORAL | 3 refills | Status: DC | PRN

## 2018-07-09 NOTE — Patient Instructions (Signed)
Serotonin Syndrome  Serotonin is a chemical in your body (neurotransmitter) that helps to control several functions, such as:  · Brain and nerve cell function.  · Mood and emotions.  · Memory.  · Eating.  · Sleeping.  · Sexual activity.  · Stress response.  Having too much serotonin in your body can cause serotonin syndrome. This condition can be harmful to your brain and nerve cells. This can be a life-threatening condition.  What are the causes?  This condition may be caused by taking medicines or drugs that increase the level of serotonin in your body, such as:  · Antidepressant medicines.  · Migraine medicines.  · Certain pain medicines.  · Certain drugs, including ecstasy, LSD, cocaine, and amphetamines.  · Over-the-counter cough or cold medicines that contain dextromethorphan.  · Certain herbal supplements, including St. John's wort, ginseng, and nutmeg.  This condition usually occurs when you take these medicines or drugs in combination, but it can also happen with a high dose of a single medicine or drug.  What increases the risk?  You are more likely to develop this condition if:  · You just started taking a medicine or drug that increases the level of serotonin in the body.  · You recently increased the dose of a medicine or drug that increases the level of serotonin in the body.  · You take more than one medicine or drug that increases the level of serotonin in the body.  What are the signs or symptoms?  Symptoms of this condition usually start within several hours of taking a medicine or drug. Symptoms may be mild or severe. Mild symptoms include:  · Sweating.  · Restlessness or agitation.  · Muscle twitching or stiffness.  · Rapid heart rate.  · Nausea and vomiting.  · Diarrhea.  · Headache.  · Shivering or goose bumps.  · Confusion.  Severe symptoms include:  · Irregular heartbeat.  · Seizures.  · Loss of consciousness.  · High fever.  How is this diagnosed?  This condition may be diagnosed based  on:  · Your medical history.   · A physical exam.  · Your prior use of drugs and medicines.  · Blood or urine tests. These may be used to rule out other causes of your symptoms.  How is this treated?  The treatment for this condition depends on the severity of your symptoms.  · For mild cases, stopping the medicine or drug that caused your condition is usually all that is needed.  · For moderate to severe cases, treatment in a hospital may be needed to prevent or manage life-threatening symptoms. This may include medicines to control your symptoms, IV fluids, interventions to support your breathing, and treatments to control your body temperature.  Follow these instructions at home:  Medicines    · Take over-the-counter and prescription medicines only as told by your health care provider. This is important.  · Check with your health care provider before you start taking any new prescriptions, over-the-counter medicines, herbs, or supplements.  · Avoid combining any medicines that can cause this condition to occur.  Lifestyle    · Maintain a healthy lifestyle.  ? Eat a healthy diet that includes plenty of vegetables, fruits, whole grains, low-fat dairy products, and lean protein. Do not eat a lot of foods that are high in fat, added sugars, or salt.  ? Get the right amount and quality of sleep. Most adults need 7-9 hours of sleep each night.  ?   or tobacco, such as cigarettes and e-cigarettes. If you need help quitting, ask your health care provider.  Keep all follow-up visits as told by your health care provider. This is important. Contact a health care provider if:  Your symptoms do not  improve or they get worse. Get help right away if you:  Have worsening confusion, severe headache, chest pain, high fever, seizures, or loss of consciousness.  Experience serious side effects of medicine, such as swelling of your face, lips, tongue, or throat.  Have serious thoughts about hurting yourself or others. These symptoms may represent a serious problem that is an emergency. Do not wait to see if the symptoms will go away. Get medical help right away. Call your local emergency services (911 in the [place] Do not drive yourself to the hospital. If you ever feel like you may hurt yourself or others, or have thoughts about taking your own life, get help right away. You can go to your nearest emergency department or call:  Your local emergency services (911 in the [place]  A suicide crisis helpline, such as the [place] [facility] at (709) 178-0965. This is open 24 hours a day. Summary  Serotonin is a brain chemical that helps to regulate the nervous system. High levels of serotonin in the body can cause serotonin syndrome, which is a very dangerous condition.  This condition may be caused by taking medicines or drugs that increase the level of serotonin in your body.  Treatment depends on the severity of your symptoms. For mild cases, stopping the medicine or drug that caused your condition is usually all that is needed.  Check with your health care provider before you start taking any new prescriptions, over-the-counter medicines, herbs, or supplements. This information is not intended to replace advice given to you by your health care provider. Make sure you discuss any questions you have with your health care provider. Document Released: 05/04/2004 Document Revised: 05/04/2017 Document Reviewed: 05/04/2017 Elsevier Interactive Patient Education  2019 [employer] Mirtazapine tablets What is this medicine? MIRTAZAPINE (mir TAZ a peen) is used to treat depression.  This medicine may be used for other purposes; ask your health care provider or pharmacist if you have questions. COMMON BRAND NAME(S): Remeron What should I tell my health care provider before I take this medicine? They need to know if you have any of these conditions: -bipolar disorder -glaucoma -kidney disease -liver disease -suicidal thoughts -an unusual or allergic reaction to mirtazapine, other medicines, foods, dyes, or preservatives -pregnant or trying to get pregnant -breast-feeding How should I use this medicine? Take this medicine by mouth with a glass of water. Follow the directions on the prescription label. Take your medicine at regular intervals. Do not take your medicine more often than directed. Do not stop taking this medicine suddenly except upon the advice of your doctor. Stopping this medicine too quickly may cause serious side effects or your condition may worsen. A special MedGuide will be given to you by the pharmacist with each prescription and refill. Be sure to read this information carefully each time. Talk to your pediatrician regarding the use of this medicine in children. Special care may be needed. Overdosage: If you think you have taken too much of this medicine contact a poison control center or emergency room at once. NOTE: This medicine is only for you. Do not share this medicine with others. What if I miss a dose? If you miss a dose, take it as soon as you  can. If it is almost time for your next dose, take only that dose. Do not take double or extra doses. What may interact with this medicine? Do not take this medicine with any of the following medications: -linezolid -MAOIs like Carbex, Eldepryl, Marplan, Nardil, and Parnate -methylene blue (injected into a vein) This medicine may also interact with the following medications: -alcohol -antiviral medicines for HIV or AIDS -certain medicines that treat or prevent blood clots like warfarin -certain  medicines for depression, anxiety, or psychotic disturbances -certain medicines for fungal infections like ketoconazole and itraconazole -certain medicines for migraine headache like almotriptan, eletriptan, frovatriptan, naratriptan, rizatriptan, sumatriptan, zolmitriptan -certain medicines for seizures like carbamazepine or phenytoin -certain medicines for sleep -cimetidine -erythromycin -fentanyl -lithium -medicines for blood pressure -nefazodone -rasagiline -rifampin -supplements like St. John's wort, kava kava, valerian -tramadol -tryptophan This list may not describe all possible interactions. Give your health care provider a list of all the medicines, herbs, non-prescription drugs, or dietary supplements you use. Also tell them if you smoke, drink alcohol, or use illegal drugs. Some items may interact with your medicine. What should I watch for while using this medicine? Tell your doctor if your symptoms do not get better or if they get worse. Visit your doctor or health care professional for regular checks on your progress. Because it may take several weeks to see the full effects of this medicine, it is important to continue your treatment as prescribed by your doctor. Patients and their families should watch out for new or worsening thoughts of suicide or depression. Also watch out for sudden changes in feelings such as feeling anxious, agitated, panicky, irritable, hostile, aggressive, impulsive, severely restless, overly excited and hyperactive, or not being able to sleep. If this happens, especially at the beginning of treatment or after a change in dose, call your health care professional. Dennis Bast may get drowsy or dizzy. Do not drive, use machinery, or do anything that needs mental alertness until you know how this medicine affects you. Do not stand or sit up quickly, especially if you are an older patient. This reduces the risk of dizzy or fainting spells. Alcohol may interfere with  the effect of this medicine. Avoid alcoholic drinks. This medicine may cause dry eyes and blurred vision. If you wear contact lenses you may feel some discomfort. Lubricating drops may help. See your eye doctor if the problem does not go away or is severe. Your mouth may get dry. Chewing sugarless gum or sucking hard candy, and drinking plenty of water may help. Contact your doctor if the problem does not go away or is severe. What side effects may I notice from receiving this medicine? Side effects that you should report to your doctor or health care professional as soon as possible: -allergic reactions like skin rash, itching or hives, swelling of the face, lips, or tongue -anxious -changes in vision -chest pain -confusion -elevated mood, decreased need for sleep, racing thoughts, impulsive behavior -eye pain -fast, irregular heartbeat -feeling faint or lightheaded, falls -feeling agitated, angry, or irritable -fever or chills, sore throat -hallucination, loss of contact with reality -loss of balance or coordination -mouth sores -redness, blistering, peeling or loosening of the skin, including inside the mouth -restlessness, pacing, inability to keep still -seizures -stiff muscles -suicidal thoughts or other mood changes -trouble passing urine or change in the amount of urine -trouble sleeping -unusual bleeding or bruising -unusually weak or tired -vomiting Side effects that usually do not require medical attention (report to  your doctor or health care professional if they continue or are bothersome): -change in appetite -constipation -dizziness -dry mouth -muscle aches or pains -nausea -tired -weight gain This list may not describe all possible side effects. Call your doctor for medical advice about side effects. You may report side effects to FDA at 1-800-FDA-1088. Where should I keep my medicine? Keep out of the reach of children. Store at room temperature between 15 and  30 degrees C (59 and 86 degrees F) Protect from light and moisture. Throw away any unused medicine after the expiration date. NOTE: This sheet is a summary. It may not cover all possible information. If you have questions about this medicine, talk to your doctor, pharmacist, or health care provider.  2019 Elsevier/[employer] Standard (2015-08-26 17:30:45)

## 2018-07-09 NOTE — Telephone Encounter (Signed)
Pt returned RN's call °

## 2018-07-09 NOTE — Telephone Encounter (Signed)
I attempted to call the patient again at work and was informed she had left for the day.  I called her mobile number and left a message requesting a return call.

## 2018-07-09 NOTE — Telephone Encounter (Signed)
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  She is aware we are not offering migraine infusions in the office right now. She feels her current migraine is due to the weather changes. For the last few days, she has tried Maxalt with only temporary relief.  She would like to know if another medication can be called to the pharmacy to break her current migraine cycle.

## 2018-07-09 NOTE — Progress Notes (Signed)
Virtual Visit via Video Note  I connected with Anita Weaver on 07/09/18 at  3:00 PM EDT by a video enabled telemedicine application and verified that I am speaking with the correct person using two identifiers.   I discussed the limitations of evaluation and management by telemedicine and the availability of in person appointments. The patient expressed understanding and agreed to proceed.   I discussed the assessment and treatment plan with the patient. The patient was provided an opportunity to ask questions and all were answered. The patient agreed with the plan and demonstrated an understanding of the instructions.   The patient was advised to call back or seek an in-person evaluation if the symptoms worsen or if the condition fails to improve as anticipated.  I provided 60 minutes of non-face-to-face time during this encounter.   Ursula Alert, MD  Psychiatric Initial Adult Assessment   Patient Identification: Anita Weaver MRN:  [id number] Date of Evaluation:  07/09/2018 Referral Source: Lorelle Gibbs NP  Chief Complaint:  ' I am here to establish care." Chief Complaint    Establish Care     Visit Diagnosis:    ICD-10-CM   1. Bipolar I disorder, most recent episode depressed (HCC) F31.30 lurasidone (LATUDA) 40 MG TABS tablet  2. Insomnia, unspecified type G47.00 mirtazapine (REMERON) 7.5 MG tablet    History of Present Illness:  Anita Weaver is a 43 year old Caucasian female, employed, married, lives in [place], has a history of bipolar disorder, migraine headaches impingement in shoulder, was evaluated by virtual video consult today.  Patient today reports she was diagnosed with bipolar disorder in 2011.  She reports she was seen by Dr. Candis Schatz at that time.  She reports she was on Depakote for a short time after that.  She however stopped taking it since she felt it was not helpful.  Patient currently follows up [facility].  She reports she has been taking Latuda  40 mg since the past 3 years.  She describes her mood swings are stable on the current medication regimen.  Patient  reports that she has a history of racing thoughts, mood lability, irritability, anger issues as well as depressive symptoms like sadness, lack of motivation, anhedonia and things like that.  She reports she does not remember any clear manic or hypomanic episodes.  Patient reports sleep problems.  She reports previously when her bipolar symptoms were unstable she had a lot of racing thoughts that affected her sleep.  She however reports the [place] helps with the racing thoughts.  Her sleep problems currently are more so due to her physical problems like headaches and pain.  She is interested in a sleep medication at this time.  Patient denies any history of perceptual disturbances.  Patient denies any suicide attempts.  Patient denies any homicidality.  She denies any history of trauma.  Patient is married and has good support system from her husband.  She also has 3 children aged 70, 42 and 70 and has a good relationship with them.  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  Patient reports she continues to follow-up with her providers for her headache and pain.  She got an injection for the headaches today and is hoping that will help her better.  Patient denies abusing any substances.      Associated Signs/Symptoms: Depression Symptoms:  depressed mood, insomnia, difficulty concentrating, anxiety, (Hypo) Manic Symptoms:  Distractibility,  Impulsivity, Labiality of Mood, Anxiety Symptoms:  Excessive Worry, Psychotic Symptoms:  denies PTSD Symptoms: Negative  Past Psychiatric History: She denies inpatient mental health admissions.  Patient reports she was diagnosed by Dr. Candis Schatz in 2011 with bipolar disorder.  Patient denies any suicide attempts.  Most recently she was being  followed at [facility].  She reports she was asked to start therapy sessions there since that is the clinic policy and hence she decided to transition to this clinic.  Previous Psychotropic Medications: Yes Latuda, Depakote  Substance Abuse History in the last 12 months:  No.  Consequences of Substance Abuse: Negative  Past Medical History:  Past Medical History:  Diagnosis Date  . Allergy   . Arthritis   . Asthma   . Chronic pelvic pain in female   . Endometriosis   . FH: breast cancer in first degree relative    Mother and sister both are BRCA positive; patient is BRCA negative  . GERD (gastroesophageal reflux disease)   . Headache    migraines  . Heart murmur   . Hyperlipidemia   . Hypertension   . Interstitial cystitis   . Irritable bowel disease   . Neck pain     Past Surgical History:  Procedure Laterality Date  . BREAST BIOPSY Left 02/08/2017  . CHOLECYSTECTOMY    . LAPAROSCOPY  2006  . TUBAL LIGATION      Family Psychiatric History: Patient denies history of mental health problems in her family.  Family History:  Family History  Problem Relation Age of Onset  . Cancer Mother 49       breast  . Breast cancer Mother 27  . Cancer Sister 55  . Breast cancer Sister 21  . Kidney disease Son   . Heart attack Maternal Grandfather   . Heart attack Paternal Grandmother   . Heart attack Paternal Grandfather     Social History:   Social History   Socioeconomic History  . Marital status: Married    Spouse name: [name]  . Number of children: 3  . Years of education: 31  . 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 Financial resource strain: Not on file  . Food insecurity:    Worry: Not on file    Inability: Not on file  . Transportation needs:    Medical: Not on file    Non-medical: Not on file  Tobacco Use  . Smoking status: Never Smoker  . Smokeless tobacco: Never Used  Substance and Sexual  Activity  . Alcohol use: Yes    Alcohol/week: 0.0 standard drinks    Comment: occasionally  . Drug use: No  . Sexual activity: Yes    Partners: Male    Birth control/protection: Surgical    Comment: tubaligation  Lifestyle  . Physical activity:    Days per week: Not on file    Minutes per session: Not on file  . Stress: Not on file  Relationships  . Social connections:    Talks on phone: Not on file    Gets together: Not on file    Attends religious service: Not on file    Active member of club or organization: Not on file    Attends meetings of clubs or organizations: Not on file    Relationship status: Not on file  Other Topics Concern  . Not on file  Social History Narrative   ** Merged History  Encounter **       ** Data from: 02/09/14 Enc Dept: [facility]       ** Data from: 11/10/13 Enc Dept: [facility]   Patient lives at home with [name]   Patient works full time.   Right handed.   Caffeine- none   Education- high school    Additional Social History: Patient is married.  She lives in [place].  She is a Print production planner.  She was raised by her parents and has a sister.  She has 3 children aged 50, 85 and 65.  Allergies:   Allergies  Allergen Reactions  . Imitrex [Sumatriptan] Other (See Comments)    Heart palpitations, hot/cold sweats  . Demerol [Meperidine] Other (See Comments)  . Demerol [Meperidine]   . Flagyl [Metronidazole] Hives    Metabolic Disorder Labs: No results found for: HGBA1C, MPG Lab Results  Component Value Date   PROLACTIN 19.0 05/24/2015   Lab Results  Component Value Date   CHOL 173 05/24/2015   TRIG 104 05/24/2015   HDL 33 (L) 05/24/2015   CHOLHDL 5.2 (H) 05/24/2015   VLDL 21 05/24/2015   LDLCALC 119 05/24/2015   Lab Results  Component Value Date   TSH 1.95 05/24/2015    Therapeutic Level Labs: No results found for: LITHIUM No results found for: CBMZ No results found for:  VALPROATE  Current Medications: Current Outpatient Medications  Medication Sig Dispense Refill  . albuterol (PROAIR HFA) 108 (90 Base) MCG/ACT inhaler Inhale 2 puffs into the lungs every 4 (four) hours as needed for wheezing or shortness of breath. 1 Inhaler 2  . DEXILANT 60 MG capsule TAKE 1 CAPSULE BY MOUTH DAILY 30 capsule 11  . Diclofenac Potassium (CAMBIA) 50 MG PACK Take 50 mg by mouth as needed. 9 each 11  . Erenumab-aooe (AIMOVIG) 140 MG/ML SOAJ Inject 140 mg into the skin every 30 (thirty) days. 1 pen 11  . ibuprofen (ADVIL,MOTRIN) 800 MG tablet Take 1 tablet (800 mg total) by mouth every 8 (eight) hours as needed. 30 tablet 2  . lurasidone (LATUDA) 40 MG TABS tablet Take 1 tablet (40 mg total) by mouth daily with supper. 30 tablet 3  . medroxyPROGESTERone (DEPO-PROVERA) 150 MG/ML injection Inject 1 mL (150 mg total) into the muscle every 3 (three) months. 1 mL 3  . meloxicam (MOBIC) 7.5 MG tablet Take 1 -2 tablets daily PRN for two weeks. 30 tablet 0  . mirtazapine (REMERON) 7.5 MG tablet Take 1 tablet (7.5 mg total) by mouth at bedtime. FOR SLEEP 30 tablet 0  . ondansetron (ZOFRAN) 4 MG tablet Take one tablet every 8 hours as needed. #20 to last 30 days. For migraines. 20 tablet 11  . ranitidine ([facility] RANITIDINE) 75 MG tablet Take by mouth. Reported on 04/15/2015    . rizatriptan (MAXALT) 10 MG tablet Take 1 tab at onset of migraine.  May repeat once in 2 hrs, if needed.  Max dose: 2 tabs/day. This is a 90 day prescription. 45 tablet 3  . tiZANidine (ZANAFLEX) 4 MG tablet Take 1 tablet (4 mg total) by mouth every 6 (six) hours as needed for muscle spasms. 30 tablet 3  . topiramate (TOPAMAX) 50 MG tablet TAKE 2 TABLETS (100 MG TOTAL) BY MOUTH 2 (TWO) TIMES DAILY 360 tablet 1  . venlafaxine XR (EFFEXOR-XR) 37.5 MG 24 hr capsule TAKE 1 CAPSULE (37.5 MG TOTAL) BY MOUTH DAILY WITH BREAKFAST. 90 capsule 4   No current facility-administered medications  for this visit.      Musculoskeletal: Strength & Muscle Tone: within normal limits Gait & Station: normal Patient leans: N/A  Psychiatric Specialty Exam: Review of Systems  Neurological: Positive for headaches.  Psychiatric/Behavioral: The patient is nervous/anxious and has insomnia.   All other systems reviewed and are negative.   There were no vitals taken for this visit.There is no height or weight on file to calculate BMI.  General Appearance: Casual  Eye Contact:  Fair  Speech:  Clear and Coherent  Volume:  Normal  Mood:  Anxious  Affect:  Appropriate  Thought Process:  Goal Directed and Descriptions of Associations: Intact  Orientation:  Full (Time, Place, and Person)  Thought Content:  Logical  Suicidal Thoughts:  No  Homicidal Thoughts:  No  Memory:  Immediate;   Fair Recent;   Fair Remote;   Fair  Judgement:  Fair  Insight:  Fair  Psychomotor Activity:  Normal  Concentration:  Concentration: Fair and Attention Span: Fair  Recall:  [employer] of Knowledge:Fair  Language: Fair  Akathisia:  No  Handed:  Right  AIMS (if indicated):  Denies tremors, rigidity,stiffness  Assets:  Communication Skills Desire for Improvement Social Support  ADL's:  Intact  Cognition: WNL  Sleep:  Poor   Screenings: PHQ2-9     Office Visit from 05/20/2018 in [facility] Visit from 04/27/2017 in [facility] Office Visit from 12/26/2016 in [facility] Office Visit from 09/01/2016 in [facility] Office Visit from 02/21/2016 in [facility]  PHQ-2 Total Score  0  0  0  0  0  PHQ-9 Total Score  3  -  -  -  -      Assessment and Plan: Jemma is a 43 year old Caucasian female, employed, married, lives in [place], has a history of bipolar disorder, migraine headaches, impingement in the shoulder, was evaluated by virtual Video consult today.  Patient with psychosocial stressors of physical problems, chronic  pain.  Patient currently stable on Latuda however continues to struggle with sleep problems.  Will readjust her medications.  Plan as noted below.  Plan For bipolar disorder most recent episode depressed- improving Continue Latuda 40 mg p.o. daily with supper. Discussed with patient to sign a release to obtain medical records from [facility].  For insomnia- unstable Start mirtazapine 7.5 mg p.o. nightly  She is on Maxalt as well as venlafaxine for her migraine headaches.  Discussed with her the drug to drug interaction including serotonin syndrome with combination of medications for migraine as well as antidepressants.  I have reviewed TSH in [facility] dated 05/24/2015-1.95-within normal limits.  Will order labs when she returns to clinic.  Follow-up in clinic in 2 weeks or sooner if needed- April 14-11 AM.  I have spent atleast 60 minutes non face to face with patient today. More than 50 % of the time was spent for psychoeducation and supportive psychotherapy and care coordination.  This note was generated in part or whole with voice recognition software. Voice recognition is usually quite accurate but there are transcription errors that can and very often do occur. I apologize for any typographical errors that were not detected and corrected.        Ursula Alert, MD 3/31/20204:49 PM

## 2018-07-09 NOTE — Telephone Encounter (Signed)
I was able to speak with the patient.  She is hesitant to try additional oral medications that may cause drowsiness.  Per vo by Dr. Krista Blue, offer nerve block today.  She is agreeable to the nerve block.  She has been placed on the schedule.

## 2018-07-09 NOTE — Telephone Encounter (Signed)
The options for abortive treatment.  May consider cocktail of  Cambia 50mg   zofran 4mg  Tizanidine 4mg   Plus triptan, Maxalt or SQ imitrex.

## 2018-07-09 NOTE — Progress Notes (Signed)
   History: Intractable headache for 10 days, failed daily dose of maxalt    Bilateral occipital and trigeminal nerve block; trigger point injection of bilateral cervical and upper trapezius muscles for intractable headache  Bupivacaine 0.5% was injected on the scalp bilaterally at several locations:  -On the occipital area of the head, 3 injections each side, 0.5 cc per injection at the midpoint between the mastoid process and the occipital protuberance. 2 other injections were done one finger breadth from the initial injection, one at a 10 o'clock position and the other at a 2 o'clock position.  -2 injections of 0.5 cc were done in the temporal regions, 2 fingerbreadths above the tragus of the ear, with the second injection one fingerbreadth posteriorly to the first.  -2 injections were done on the brow, 1 in the medial brow and one over the supraorbital nerve notch, with 0.1 cc for each injection  -1 injection each side of 0.5 cc was done anterior to the tragus of the ear for a trigeminal ganglion injection  -0.5 cc was injected into bilateral upper trapezius and bilateral upper cervical paraspinals   The patient tolerated the injections well, no complications of the procedure were noted. Injections were made with a 27-gauge needle.

## 2018-07-10 ENCOUNTER — Telehealth: Payer: Self-pay | Admitting: Neurology

## 2018-07-10 NOTE — Telephone Encounter (Signed)
Pt called today, she is allergic to imitrex. She did not pick this up at pharmacy. Please call to discuss alternate medication.

## 2018-07-11 NOTE — Telephone Encounter (Signed)
Check to see if her headache responded with nerve block,   Ok not to refill imitrex.  Maxalt along with zofran, tizanidine, aleve as needed for severe, prolonged headaches.

## 2018-07-11 NOTE — Telephone Encounter (Signed)
Spoke to Anita Weaver.  She is agreeable to try the oral combination of medications below to see if it will break her migraine cycle.  The nerve block was helpful but did not rid her pain completely.

## 2018-07-23 ENCOUNTER — Ambulatory Visit: Payer: Self-pay | Admitting: Psychiatry

## 2018-07-23 NOTE — Telephone Encounter (Signed)
Pt called in wanting to know if there is something else she can take for the migraines

## 2018-07-24 NOTE — Telephone Encounter (Signed)
Fail to reach patient by multiple attempts.  Please call patient, will not provide iv infusion for headache to decrease the strain to infusion nurse.  Last visit with our office was in Nov 2019, multiple phone calls for acute headache rescue treatment, including nerve block on July 09 2018.  We may proceed with a virtual visit to discuss treatment options

## 2018-07-24 NOTE — Telephone Encounter (Signed)
I was able to speak with the patient and she consented to a virtual visit with Dr. Krista Blue.  She does not get home from work until after 3pm.  She has been scheduled on 07/25/2018 at 4pm.

## 2018-07-25 ENCOUNTER — Ambulatory Visit (INDEPENDENT_AMBULATORY_CARE_PROVIDER_SITE_OTHER): Payer: 59 | Admitting: Neurology

## 2018-07-25 ENCOUNTER — Other Ambulatory Visit: Payer: Self-pay

## 2018-07-25 DIAGNOSIS — G43909 Migraine, unspecified, not intractable, without status migrainosus: Secondary | ICD-10-CM

## 2018-07-25 MED ORDER — VENLAFAXINE HCL ER 75 MG PO CP24: 75.0000 mg | ORAL_CAPSULE | Freq: Every day | ORAL | 11 refills | Status: DC

## 2018-07-25 MED ORDER — PROPRANOLOL HCL ER 80 MG PO CP24: 80.0000 mg | ORAL_CAPSULE | Freq: Every day | ORAL | 11 refills | Status: DC

## 2018-07-25 MED ORDER — ELETRIPTAN HYDROBROMIDE 40 MG PO TABS: 40.0000 mg | ORAL_TABLET | ORAL | 11 refills | Status: DC | PRN

## 2018-07-25 NOTE — Progress Notes (Signed)
[facility]  PATIENT: Anita Weaver DOB: 11-Jul-1975  HISTORY OF PRESENT ILLNESS:Anita Weaver is a 43 years old right-handed Caucasian female, accompanied by her husband, referred by her primary care physician Dr. Manuella Ghazi for evaluation of migraine headaches  She reported a history of migraine headaches since age 89, at that time, she also has excessive stress, for a while, she been having headaches daily, since her separation with her previous husband, her headache has much improved, doing very well from 2013-2014, but since October 2014, she began to get migraine again, gradually getting worse, to almost daily basis, she has been taking Excedrin Migraine every day 2-4 tablets, her migraine headaches are usually lateralized severe pounding headaches with associated light noise sensitivity, nauseous, lasting for days. Previously she has tried different preventive medications including Topamax, moderate help, nortriptyline, no help, there was an 1 medication prescribed by her psychologist for her bipolar disorder, Depakote has been very helpful for him migraine Over-the-counter Tylenol, ibuprofen and is no longer helpful, Excedrin migraine only provide mild temporary relief, she has tried triptan treatment in the past, Imitrex causing heart racing, sweaty, difficulty breathing, Maxalt has been very helpful, but she was not able afford it because the high co-pay, Flexeril has dade her sleepy Trigger for her migraines, weather change, stress, caffeine, sleep deprivation  She had tubal ligations now, she also complains of worsening depression over the past few months, will set up appointment with her psychologist, she is not not on any medications  UPDATE August 3rd 2015: She came in with her husband today, She has tried Depakote ER 523m qhs for almost 2 months without helping, worry about side effect, Inderal 49mbid did not help either.  She still complains of headaches 20 days in a  month, Maxalt works well, headache would go away in 30 minutes, she used it up all 15 tabs of Maxalt in one month. She also complains of dizziness, spinning sensation, fall over, slurred speech  MRI of the brain in September 2015 was essentially normal. She is not on any preventive medications now, for a while, her migraines well controlled by Maxalt, but since May 2016, she began to have frequent, almost daily headaches, left retro-orbital area moderate to severe pounding headache with associated light noise sensitivity, she been taking almost daily Maxalt, Which helps her, but only temporarily, She does not want to consider Botox injection as preventive medications, also complains of midline neck pain, achy sensation, no radiating pain, no bilateral upper extremity paresthesia and weakness. Topamax 10023mid works for her somewhat in the past, wants to be back on it  UPDATE May 29 2017: She is accompanied by her husband at today's clinical visit, she complains of frequent migraine headaches, about 14 headaches in a month, usually triggered by caffeine intake, but despite tapering off of her caffeine intake, she has frequent migraine headaches, call office multiple times require IV infusion, this particular headache has been ongoing for 2 weeks, she has tried multiple dose almost daily dose of Maxalt without breaking her headache.  Last iv infusion on Jan 9th, she used up all her 15 tablets of Maxalt each month, call the office multiple times requiring preauthorization, early refill, did respond to Depacon IV infusion along with Toradol, and Phenergan.  UPDATE Feb 25 2018: She started Aimovig 44m64m since May 2019, initially it helped her very well, she barely has any migraine from May to August 2019, with weather change in September 2019, she began to have  frequent migraine headaches again, received IV Depacon infusion on September 4, which helped her migraine very well, is to come back for second IV  infusion on February 18, 2018, but she continue have daily headaches over past 1 week, use daily Maxalt.  Lateralized severe headache with associated light noise sensitivity, nauseous  Virtual Visit via Video  I connected with Anita Weaver on 07/25/18 at  by video and verified that I am speaking with the correct person using two identifiers.   I discussed the limitations, risks, security and privacy concerns of performing an evaluation and management service by video and the availability of in person appointments. I also discussed with the patient that there may be a patient responsible charge related to this service. The patient expressed understanding and agreed to proceed.  History of Present Illness: She continue complains of frequent headaches, despite higher dose of aimovig 140 mg every month, Effexor 37.5 mg daily, Topamax 100 mg twice a day,  She reported more headaches day than not in a month, Maxalt only provide partial help,  Over the past month, she asked multiple times for urgent headache treatment.  Observations/Objective: I have reviewed problem lists, medications, allergies.  Assessment and Plan: Chronic migraine headaches  Under suboptimal control despite multiple preventive medications, continue aimovig 140 mg, Topamax 100 mg twice a day, increase Effexor to 75 mg daily, add on propanolol LA 80 mg daily  She likely has a component of medicine rebound headache with her frequent triptan use, emphasized the importance of only treat moderate to severe headache lasting 2-3 times each week  Suboptimal response to Maxalt, will try Relpax, may combine it with Ambien, Zofran, tizanidine as needed  Follow Up Instructions:   3 months   I discussed the assessment and treatment plan with the patient. The patient was provided an opportunity to ask questions and all were answered. The patient agreed with the plan and demonstrated an understanding of the instructions.   The patient  was advised to call back or seek an in-person evaluation if the symptoms worsen or if the condition fails to improve as anticipated.  I provided 30 minutes of non-face-to-face time during this encounter.   Marcial Pacas, MD

## 2018-07-26 ENCOUNTER — Encounter: Payer: Self-pay | Admitting: Neurology

## 2018-07-29 ENCOUNTER — Telehealth: Payer: Self-pay | Admitting: Neurology

## 2018-07-29 NOTE — Telephone Encounter (Signed)
LVM to schedule a 3 month follow-up with Judson Roch NP after virtual visit, per Dr. Krista Blue.

## 2018-07-30 ENCOUNTER — Telehealth: Payer: Self-pay

## 2018-07-30 NOTE — Telephone Encounter (Signed)
Hi jess please advise this patient to schedule an appointment - tell her we are offering virtual visits. She was asked to come back in 2 weeks at her last sessions. Its been almost a month . Thanks and tell her we can discuss medications at that session. And when u schedule patients pls schedule them at 9 am or later since I start at 9 am now. thanks

## 2018-07-30 NOTE — Telephone Encounter (Signed)
pt called states she does not feel comfortable taking the mirtazapine (REMERON) 7.5 MG tablet. she would much rather have a regular sleeping pill verses a antidepressent.

## 2018-08-05 ENCOUNTER — Other Ambulatory Visit: Payer: Self-pay | Admitting: Psychiatry

## 2018-08-05 DIAGNOSIS — G4700 Insomnia, unspecified: Secondary | ICD-10-CM

## 2018-08-06 ENCOUNTER — Telehealth: Payer: Self-pay | Admitting: Neurology

## 2018-08-06 DIAGNOSIS — G8929 Other chronic pain: Secondary | ICD-10-CM

## 2018-08-06 DIAGNOSIS — R51 Headache: Principal | ICD-10-CM

## 2018-08-06 NOTE — Telephone Encounter (Signed)
Pt called requesting a referral to a Headache specialist. That is all the information she would provide.

## 2018-08-06 NOTE — Telephone Encounter (Signed)
Spoke to patient and she is requesting a referral to [facility].

## 2018-08-07 NOTE — Telephone Encounter (Signed)
Have put in the order for [facility] refer

## 2018-08-07 NOTE — Addendum Note (Signed)
Addended by: Marcial Pacas on: 08/07/2018 09:42 AM   Modules accepted: Orders

## 2018-08-10 DIAGNOSIS — G43011 Migraine without aura, intractable, with status migrainosus: Secondary | ICD-10-CM | POA: Diagnosis not present

## 2018-08-12 NOTE — Telephone Encounter (Signed)
Referral has been sent. [facility] will call patient to schedule . Telephone [phone]- fax [phone].

## 2018-08-15 ENCOUNTER — Ambulatory Visit: Payer: Self-pay | Admitting: Psychiatry

## 2018-08-17 ENCOUNTER — Other Ambulatory Visit: Payer: Self-pay | Admitting: Neurology

## 2018-08-26 ENCOUNTER — Ambulatory Visit: Payer: 59 | Admitting: Neurology

## 2018-09-03 NOTE — Telephone Encounter (Signed)
Pt canceled 2 appt

## 2018-09-09 ENCOUNTER — Ambulatory Visit: Payer: 59 | Admitting: Neurology

## 2018-09-18 ENCOUNTER — Other Ambulatory Visit: Payer: Self-pay

## 2018-09-18 ENCOUNTER — Ambulatory Visit (INDEPENDENT_AMBULATORY_CARE_PROVIDER_SITE_OTHER): Payer: 59 | Admitting: *Deleted

## 2018-09-18 VITALS — BP 113/80 | HR 88

## 2018-09-18 DIAGNOSIS — Z3042 Encounter for surveillance of injectable contraceptive: Secondary | ICD-10-CM

## 2018-09-18 MED ORDER — MEDROXYPROGESTERONE ACETATE 150 MG/ML IM SUSP
150.0000 mg | Freq: Once | INTRAMUSCULAR | Status: AC
  Administered 2018-09-18: 150 mg via INTRAMUSCULAR

## 2018-09-18 NOTE — Progress Notes (Signed)
Attestation of Attending Supervision of clinical support staff: I agree with the care provided to this patient and was available for any consultation.  I have reviewed theRN's note and chart, and I agree with the management and plan.  Raylee Strehl Niles Kerra Guil[place]il, MD, MPH, ABFM Attending Physician Faculty Practice- [facility]  

## 2018-09-18 NOTE — Progress Notes (Signed)
Date last pap: 2018. Last Depo-Provera: 07/03/2018. Side Effects if any: None. Serum HCG indicated? NA Depo-Provera 150 mg IM given by: Crosby Oyster, RN. Next appointment due 8/26-9/9.

## 2018-10-07 ENCOUNTER — Telehealth: Payer: Self-pay | Admitting: Neurology

## 2018-10-07 NOTE — Telephone Encounter (Signed)
The patient had previously requested to see a headache specialist at [facility].  They could not see her until next year.  She declined the appointment.  Additionally, she said the drive is too long to [place], especially when she is in need of acute migraine treatment.  She would like to continue care with Dr. Krista Blue.  She feels past infusion treatment works best to resolve her acute migraines when her home medication do not relieve her pain.  She is requesting to come in for IV treatment.  She has received the following in the past:  1)Depakote 1000mg IV  2)Toradol 30mg IV 3)Phenergan 12.5mg IV, if needed.  Okay, per vo by Dr. Krista Blue, to provide the above infusion.  She will allow the IV to be repeatedonce, if needed, on the following day.  The patient will bring a driver.  Signed order will be provided to [facility].  The patient has been provided an appt time on 10/08/2018 at 3:45pm.

## 2018-10-07 NOTE — Telephone Encounter (Signed)
Pt is calling in wanting to schedule a appt for [facility]

## 2018-10-29 ENCOUNTER — Telehealth: Payer: Self-pay | Admitting: Neurology

## 2018-10-29 MED ORDER — PROMETHAZINE HCL 25 MG PO TABS: 25.0000 mg | ORAL_TABLET | Freq: Four times a day (QID) | ORAL | 6 refills | Status: DC | PRN

## 2018-10-29 NOTE — Telephone Encounter (Addendum)
Per [facility], patient's most recent infusion dates for migraine treatment occurred on 6/30 and 7/1.  She received Depacon 1000mg  and Toradol 30mg .  Per vo by Dr. Krista Blue, she may come in for this same infusion with one repeat allowed, if needed.  The patient is heading to our office now and [facility] has the availability to work her in when she arrives.

## 2018-10-29 NOTE — Telephone Encounter (Signed)
Phenergan 25 mg as needed was called to her pharmacy, 20 tablets each month with 6 refills

## 2018-10-29 NOTE — Telephone Encounter (Signed)
The patient is aware that Phenergan has been sent to the pharmacy.  I called [facility] at [phone] and left message on provider voicemail.  They need to void refills on file for ondansetron.  They are also aware that a new rx for promethazine 25mg , #20 per month has been sent in for the patient.  Provided our office number to call back with any questions.

## 2018-10-29 NOTE — Telephone Encounter (Signed)
Pt states she has had a headache that she can not get rid off and her medications are not working. Pt would like to know if she can come in for an infusion or what is advised.

## 2018-10-29 NOTE — Telephone Encounter (Signed)
The patient is reporting nausea that has been unrelieved with Zofran at home.  She does not wish to have anything for nausea through her IV but is requesting an prescription for oral Phenergan be called to the pharmacy, if Dr. Krista Blue feels it is appropriate.

## 2018-10-29 NOTE — Addendum Note (Signed)
Addended by: Marcial Pacas on: 10/29/2018 01:17 PM   Modules accepted: Orders

## 2018-11-18 ENCOUNTER — Telehealth: Payer: Self-pay

## 2018-11-18 NOTE — Telephone Encounter (Signed)
called patient and explained to her why she was dismissed. pt was dismissed because she did not want to take the medication dr. Shea Evans prescribed and she did not follow in 2 weeks as recommeded and she also canceled/no showed 2 appt.

## 2018-12-04 ENCOUNTER — Encounter: Payer: Self-pay | Admitting: *Deleted

## 2018-12-04 ENCOUNTER — Ambulatory Visit (INDEPENDENT_AMBULATORY_CARE_PROVIDER_SITE_OTHER): Payer: 59 | Admitting: *Deleted

## 2018-12-04 ENCOUNTER — Other Ambulatory Visit: Payer: Self-pay

## 2018-12-04 VITALS — BP 133/87 | HR 87 | Temp 98.4°F | Wt 194.2 lb

## 2018-12-04 DIAGNOSIS — Z3042 Encounter for surveillance of injectable contraceptive: Secondary | ICD-10-CM

## 2018-12-04 MED ORDER — MEDROXYPROGESTERONE ACETATE 150 MG/ML IM SUSP
150.0000 mg | Freq: Once | INTRAMUSCULAR | Status: AC
  Administered 2018-12-04: 15:00:00 150 mg via INTRAMUSCULAR

## 2018-12-04 NOTE — Progress Notes (Signed)
   Subjective:  Pt in for Depo Provera injection. Per patient she only comes to this clinic for Depo Provera.   Objective: Need for contraception. No unusual complaints.  Last Depo Provera injection given 09/18/2018.  Assessment: Pt tolerated Depo injection. Depo given Right Deltoid.  Plan:  Next injection due November 11-25, 2020.  Patient reported only having 1 refill left. Advised pt that pharmacy can send a refill request or she could let the RN/CMA know at next visit.  Derl Barrow, RN

## 2018-12-19 ENCOUNTER — Other Ambulatory Visit: Payer: Self-pay

## 2018-12-19 ENCOUNTER — Encounter: Payer: Self-pay | Admitting: Adult Health

## 2018-12-19 ENCOUNTER — Ambulatory Visit (INDEPENDENT_AMBULATORY_CARE_PROVIDER_SITE_OTHER): Payer: 59 | Admitting: Adult Health

## 2018-12-19 VITALS — Ht 68.0 in | Wt 190.0 lb

## 2018-12-19 DIAGNOSIS — G4700 Insomnia, unspecified: Secondary | ICD-10-CM | POA: Diagnosis not present

## 2018-12-19 DIAGNOSIS — F3130 Bipolar disorder, current episode depressed, mild or moderate severity, unspecified: Secondary | ICD-10-CM

## 2018-12-19 MED ORDER — LURASIDONE HCL 40 MG PO TABS: 40.0000 mg | ORAL_TABLET | Freq: Every day | ORAL | 1 refills | Status: DC

## 2018-12-19 NOTE — Progress Notes (Signed)
[facility] MD/PA/NP Initial Note  12/19/2018 3:57 PM Anita Weaver  MRN:  [id number]  Virtual Visit via Telephone Note  I connected with pt on 12/19/18 at  3:30 PM EDT by telephone and verified that I am speaking with the correct person using two identifiers.   I discussed the limitations, risks, security and privacy concerns of performing an evaluation and management service by telephone and the availability of in person appointments. I also discussed with the patient that there may be a patient responsible charge related to this service. The patient expressed understanding and agreed to proceed.   I discussed the assessment and treatment plan with the patient. The patient was provided an opportunity to ask questions and all were answered. The patient agreed with the plan and demonstrated an understanding of the instructions.   The patient was advised to call back or seek an in-person evaluation if the symptoms worsen or if the condition fails to improve as anticipated.  I provided 45 minutes of non-face-to-face time during this encounter.  The patient was located at home.  The provider was located at [place].  Chief Complaint:   HPI:   Describes mood today as "ok". Pleasant. Mood symptoms - denies depression, anxiety, and irritability. Stating "I'm doing good". Started on [place] 5 years ago for Bipolas Disorder. Feels like the [facility] is a "perfect fit" for her. Stable interest and motivation. Taking medications as prescribed.  Energy levels stable. Active, does not have a regular exercise routine. Works full-time 40 hours a week.  Enjoys some usual interests and activities. Lives with husband. Spending time with family - husband and 3 sons -  71, 79, and 29 grown and living on their own.  Appetite adequate. Weight stable - 190 - '5\' 8"'$ . Sleeping difficulties related to pain - impingement in arms. Varies - "has good and bad nights".   Focus and concentration stable. Completing  tasks. Managing aspects of household. 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  Denies SI or HI. Denies AH or VH.  Visit Diagnosis:    ICD-10-CM   1. Insomnia, unspecified type  G47.00 lurasidone (LATUDA) 40 MG TABS tablet  2. Bipolar I disorder, most recent episode depressed ([place])  F31.30 lurasidone (LATUDA) 40 MG TABS tablet    Past Psychiatric History: Denies psychiatric hospitalizations. Seen at [facility]. Diagnosed with BPD in 2012 by Dr Candis Schatz.   Past Medical History:  Past Medical History:  Diagnosis Date  . Allergy   . Arthritis   . Asthma   . Chronic pelvic pain in female   . Endometriosis   . FH: breast cancer in first degree relative    Mother and sister both are BRCA positive; patient is BRCA negative  . GERD (gastroesophageal reflux disease)   . Headache    migraines  . Heart murmur   . Hyperlipidemia   . Hypertension   . Interstitial cystitis   . Irritable bowel disease   . Neck pain     Past Surgical History:  Procedure Laterality Date  . BREAST BIOPSY Left 02/08/2017  . CHOLECYSTECTOMY    . LAPAROSCOPY  2006  . TUBAL LIGATION      Family Psychiatric History: Son with possible BPD - 27 y/o.  Family History:  Family History  Problem Relation Age of Onset  . Cancer Mother 65       breast  . Breast cancer Mother 77  . Cancer Sister 69  . Breast cancer Sister  35  . Kidney disease Son   . Heart attack Maternal Grandfather   . Heart attack Paternal Grandmother   . Heart attack Paternal Grandfather     Social History:  Social History   Socioeconomic History  . Marital status: Married    Spouse name: [name]  . Number of children: 3  . Years of education: 72  . 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 Financial resource strain: Not on file  . Food insecurity    Worry: Not on file    Inability: Not on file  . Transportation needs    Medical: Not on file    Non-medical:  Not on file  Tobacco Use  . Smoking status: Never Smoker  . Smokeless tobacco: Never Used  Substance and Sexual Activity  . Alcohol use: Yes    Alcohol/week: 0.0 standard drinks    Comment: occasionally  . Drug use: No  . Sexual activity: Yes    Partners: Male    Birth control/protection: Surgical    Comment: tubaligation  Lifestyle  . Physical activity    Days per week: Not on file    Minutes per session: Not on file  . Stress: Not on file  Relationships  . Social Herbalist on phone: Not on file    Gets together: Not on file    Attends religious service: Not on file    Active member of club or organization: Not on file    Attends meetings of clubs or organizations: Not on file    Relationship status: Not on file  Other Topics Concern  . Not on file  Social History Narrative   ** Merged History Encounter **       ** Data from: 02/09/14 Enc Dept: [facility]       ** Data from: 11/10/13 Enc Dept: [facility]   Patient lives at home with [name]   Patient works full time.   Right handed.   Caffeine- none   Education- high school    Allergies:  Allergies  Allergen Reactions  . Imitrex [Sumatriptan] Other (See Comments)    Heart palpitations, hot/cold sweats  . Demerol [Meperidine] Other (See Comments)  . Demerol [Meperidine]   . Flagyl [Metronidazole] Hives    Metabolic Disorder Labs: No results found for: HGBA1C, MPG Lab Results  Component Value Date   PROLACTIN 19.0 05/24/2015   Lab Results  Component Value Date   CHOL 173 05/24/2015   TRIG 104 05/24/2015   HDL 33 (L) 05/24/2015   CHOLHDL 5.2 (H) 05/24/2015   VLDL 21 05/24/2015   LDLCALC 119 05/24/2015   Lab Results  Component Value Date   TSH 1.95 05/24/2015    Therapeutic Level Labs: No results found for: LITHIUM No results found for: VALPROATE No components found for:  CBMZ  Current Medications: Current Outpatient Medications  Medication Sig Dispense  Refill  . albuterol (PROAIR HFA) 108 (90 Base) MCG/ACT inhaler Inhale 2 puffs into the lungs every 4 (four) hours as needed for wheezing or shortness of breath. 1 Inhaler 2  . DEXILANT 60 MG capsule TAKE 1 CAPSULE BY MOUTH DAILY 30 capsule 11  . Diclofenac Potassium (CAMBIA) 50 MG PACK Take 50 mg by mouth as needed. 9 each 11  . eletriptan (RELPAX) 40 MG tablet Take 1 tablet (40 mg total) by mouth as needed for migraine or headache. May repeat  in 2 hours if headache persists or recurs. 12 tablet 11  . Erenumab-aooe (AIMOVIG) 140 MG/ML SOAJ Inject 140 mg into the skin every 30 (thirty) days. 1 pen 11  . ibuprofen (ADVIL,MOTRIN) 800 MG tablet Take 1 tablet (800 mg total) by mouth every 8 (eight) hours as needed. 30 tablet 2  . lurasidone (LATUDA) 40 MG TABS tablet Take 1 tablet (40 mg total) by mouth daily with breakfast. 90 tablet 1  . medroxyPROGESTERone (DEPO-PROVERA) 150 MG/ML injection Inject 1 mL (150 mg total) into the muscle every 3 (three) months. 1 mL 3  . meloxicam (MOBIC) 7.5 MG tablet Take 1 -2 tablets daily PRN for two weeks. 30 tablet 0  . ondansetron (ZOFRAN) 4 MG tablet Take one tablet every 8 hours as needed. #20 to last 30 days. For migraines. 20 tablet 11  . promethazine (PHENERGAN) 25 MG tablet Take 1 tablet (25 mg total) by mouth every 6 (six) hours as needed for nausea or vomiting. 20 tablet 6  . propranolol ER (INDERAL LA) 80 MG 24 hr capsule Take 1 capsule (80 mg total) by mouth daily. 30 capsule 11  . ranitidine ([facility] RANITIDINE) 75 MG tablet Take by mouth. Reported on 04/15/2015    . tiZANidine (ZANAFLEX) 4 MG tablet Take 1 tablet (4 mg total) by mouth every 6 (six) hours as needed for muscle spasms. 30 tablet 3  . topiramate (TOPAMAX) 50 MG tablet TAKE 2 TABLETS (100 MG TOTAL) BY MOUTH 2 (TWO) TIMES DAILY 360 tablet 1  . venlafaxine XR (EFFEXOR-XR) 75 MG 24 hr capsule TAKE 1 CAPSULE (75 MG TOTAL) BY MOUTH DAILY WITH BREAKFAST. 90 capsule 4   No current facility-administered  medications for this visit.     Medication Side Effects: none  Orders placed this visit:  No orders of the defined types were placed in this encounter.   Psychiatric Specialty Exam:  Review of Systems  Neurological: Negative for tremors and weakness.  Psychiatric/Behavioral: Negative for depression. The patient is not nervous/anxious and does not have insomnia.     There were no vitals taken for this visit.There is no height or weight on file to calculate BMI.  General Appearance: UTA  Eye Contact:  UTA  Speech:  Normal Rate  Volume:  Normal  Mood:  Euthymic  Affect:  UTA  Thought Process:  Coherent  Orientation:  Full (Time, Place, and Person)  Thought Content: Logical   Suicidal Thoughts:  No  Homicidal Thoughts:  No  Memory:  WNL  Judgement:  Good  Insight:  Good  Psychomotor Activity:  Normal  Concentration:  Concentration: Good  Recall:  Good  Fund of Knowledge: Good  Language: Good  Assets:  Communication Skills Desire for Improvement Financial Resources/Insurance Housing Intimacy Leisure Time Physical Health Resilience Social Support Talents/Skills Transportation Vocational/Educational  ADL's:  Intact  Cognition: WNL  Prognosis:  Good   Screenings:  PHQ2-9     Office Visit from 05/20/2018 in [place] Visit from 04/27/2017 in [facility] Office Visit from 12/26/2016 in [facility] Office Visit from 09/01/2016 in [facility] Office Visit from 02/21/2016 in [facility]  PHQ-2 Total Score  0  0  0  0  0  PHQ-9 Total Score  3  -  -  -  -      Receiving Psychotherapy: No   Treatment Plan/Recommendations:   Plan:  1. Latuda '40mg'$  daily  RTC 6 months  Patient  advised to contact office with any questions, adverse effects, or acute worsening in signs and symptoms.  Discussed potential metabolic side effects associated with atypical antipsychotics, as  well as potential risk for movement side effects. Advised pt to contact office if movement side effects occur.   Aloha Gell, NP

## 2018-12-20 ENCOUNTER — Other Ambulatory Visit: Payer: Self-pay | Admitting: Neurology

## 2018-12-20 DIAGNOSIS — G43709 Chronic migraine without aura, not intractable, without status migrainosus: Secondary | ICD-10-CM

## 2018-12-29 ENCOUNTER — Other Ambulatory Visit: Payer: Self-pay | Admitting: Neurology

## 2018-12-29 DIAGNOSIS — G43709 Chronic migraine without aura, not intractable, without status migrainosus: Secondary | ICD-10-CM

## 2018-12-31 ENCOUNTER — Encounter: Payer: Self-pay | Admitting: *Deleted

## 2018-12-31 ENCOUNTER — Telehealth: Payer: Self-pay

## 2018-12-31 DIAGNOSIS — G43709 Chronic migraine without aura, not intractable, without status migrainosus: Secondary | ICD-10-CM

## 2018-12-31 MED ORDER — ONDANSETRON HCL 4 MG PO TABS: ORAL_TABLET | ORAL | 5 refills | Status: DC

## 2018-12-31 MED ORDER — AIMOVIG 140 MG/ML ~~LOC~~ SOAJ: 140.0000 mg | SUBCUTANEOUS | 11 refills | Status: DC

## 2018-12-31 MED ORDER — RIZATRIPTAN BENZOATE 10 MG PO TABS: ORAL_TABLET | ORAL | 5 refills | Status: DC

## 2018-12-31 MED ORDER — PROMETHAZINE HCL 25 MG PO TABS: 25.0000 mg | ORAL_TABLET | Freq: Three times a day (TID) | ORAL | 5 refills | Status: DC | PRN

## 2018-12-31 NOTE — Telephone Encounter (Signed)
Patient called requesting a refill on her Erenumab-aooe (AIMOVIG) 140 MG/ML SOAJ, ondansetron (ZOFRAN) 4 MG tablet. She also needs a refill on Maxalt

## 2018-12-31 NOTE — Addendum Note (Signed)
Addended by: Desmond Lope on: 12/31/2018 04:57 PM   Modules accepted: Orders

## 2018-12-31 NOTE — Telephone Encounter (Signed)
I spoke to the patient.  She is past due for an appt but was scheduled today for a mychart visit with Butler Denmark, NP on 01/27/2019.  She is requesting the following refills:  1) Aimovig 140mg   2) Relpax not helpful. She would like to go back to rizatriptan 10mg  tablets.  She was previously provided #15 per month. Per vo by Dr. Krista Blue, okay to provide rx.   3)  She would like a prescription for ondansetron 4mg  tablets for nausea during the day and promethazaine 25mg  tablets for nausea in the evenings.  Per vo by Dr. Krista Blue, she will allow a prescription for #10 per month of each medication.  The patient pays cash for her rizatripan and needs it sent to [facility] (not covered by insurance).  [facility] [place]  All previous prescriptions for Relpax, Maxalt, Zofran and Phenergan have been voided at the pharmacy.

## 2019-01-14 ENCOUNTER — Telehealth: Payer: Self-pay | Admitting: Physician Assistant

## 2019-01-14 DIAGNOSIS — M79601 Pain in right arm: Secondary | ICD-10-CM

## 2019-01-14 NOTE — Telephone Encounter (Signed)
Pt is asking for a referral for pain in both her arms  CB # [phone]  Con Memos

## 2019-01-15 NOTE — Telephone Encounter (Signed)
Referral placed.

## 2019-01-19 ENCOUNTER — Other Ambulatory Visit: Payer: Self-pay | Admitting: Neurology

## 2019-01-23 ENCOUNTER — Other Ambulatory Visit: Payer: Self-pay

## 2019-01-23 ENCOUNTER — Ambulatory Visit: Payer: 59 | Admitting: Family Medicine

## 2019-01-23 ENCOUNTER — Encounter: Payer: Self-pay | Admitting: Family Medicine

## 2019-01-23 VITALS — BP 128/84 | HR 90 | Temp 98.1°F | Resp 14 | Ht 68.0 in | Wt 198.7 lb

## 2019-01-23 DIAGNOSIS — R5383 Other fatigue: Secondary | ICD-10-CM

## 2019-01-23 DIAGNOSIS — Z1329 Encounter for screening for other suspected endocrine disorder: Secondary | ICD-10-CM | POA: Diagnosis not present

## 2019-01-23 DIAGNOSIS — Z7689 Persons encountering health services in other specified circumstances: Secondary | ICD-10-CM

## 2019-01-23 DIAGNOSIS — Z5181 Encounter for therapeutic drug level monitoring: Secondary | ICD-10-CM

## 2019-01-23 DIAGNOSIS — Z13220 Encounter for screening for lipoid disorders: Secondary | ICD-10-CM

## 2019-01-23 DIAGNOSIS — Z130 Encounter for screening for diseases of the blood and blood-forming organs and certain disorders involving the immune mechanism: Secondary | ICD-10-CM

## 2019-01-23 DIAGNOSIS — F3175 Bipolar disorder, in partial remission, most recent episode depressed: Secondary | ICD-10-CM

## 2019-01-23 DIAGNOSIS — Z13228 Encounter for screening for other metabolic disorders: Secondary | ICD-10-CM

## 2019-01-23 NOTE — Progress Notes (Signed)
Patient ID: Anita Weaver, female    DOB: 03-Nov-1975, 43 y.o.   MRN: [id number]  PCP: [name], PA-C  Chief Complaint  Patient presents with  . Annual Exam    see's gyn has pap schedule  . Medication Refill    inhaler  . Labs Only    wants hormone level for menopause having symptoms: fatigue, dry skin, irritable, cold, and hot flashes    Subjective:   Anita Weaver is a 43 y.o. female, presents to clinic with CC of the following:   Here to reestablish care at our office, for the last year went to clinic next door. Hasn't done CPE ever, but her new psychiatrist has suggested that she get her basic labs done to check on "everything" with her psych meds.  Explained our policy not to do well visits on new patients or patients with acute complaints.  She states that she is not a new patient and she does not have any new complaints although she does want to talk about perimenopausal symptoms fatigue mood weight changes and irritability  I told her we will not do a CPE visit today but we can address her other concerns and still do needed screening and monitoring for her psychiatric meds  Thinks she perimenopausal - havn't symptoms and wants to check for menopause with labs She has hot flashes, irritability - snapping off at people, her IBS with constipation has gotten worse than her normal and her skin is dry and fatigue.  She is on depo shot - with [facility].   Depo shot for the last 4 years - uses for "stage 4 endometriosis"  Her OBGYN is in [place].   Fatigue - feels tired all the time, its been worse for the past 1.5 months.  She is sleeping more, taking naps right after waking up, naps are 4-5 hours and then she wakes up exhausted.   Skin is dry, no hair loss change from baseline. Constipation is worse With exertion she will feel a little bit of palpitations, no exertional SOB, sometimes with excitement, or "rushing" she will have some asthma sx. No weight gain,  swelling  Patient states she does have mild asthma will use her inhaler when she gets short of breath occasionally has not been hospitalized, has not had any steroids or asthma exacerbations recently.  Patient Active Problem List   Diagnosis Date Noted  . Arthritis 07/09/2018  . Chronic interstitial cystitis 07/09/2018  . Irritable bowel syndrome 07/09/2018  . Scoliosis deformity of spine 07/09/2018  . Asthma 04/27/2017  . Acid reflux 04/27/2017  . Hiatal hernia with GERD 04/27/2017  . Contact dermatitis 05/15/2016  . Fatigue 01/20/2016  . Mild dysplasia of cervix (CIN I) 09/29/2014  . Pap smear abnormality of cervix with LGSIL 09/21/2014  . FH: genetic disease carrier 04/22/2014  . Migraine headache 05/08/2013  . Bipolar disorder, unspecified ([place]) 05/08/2013  . Endometriosis 04/18/2012  . Chronic pelvic pain in female 04/18/2012  . Female genital symptoms 04/18/2012      Current Outpatient Medications:  .  albuterol (PROAIR HFA) 108 (90 Base) MCG/ACT inhaler, Inhale 2 puffs into the lungs every 4 (four) hours as needed for wheezing or shortness of breath., Disp: 1 Inhaler, Rfl: 2 .  DEXILANT 60 MG capsule, TAKE 1 CAPSULE BY MOUTH DAILY, Disp: 30 capsule, Rfl: 11 .  Erenumab-aooe (AIMOVIG) 140 MG/ML SOAJ, Inject 140 mg into the skin every 30 (thirty) days., Disp: 1 pen, Rfl: 11 .  lurasidone (LATUDA) 40 MG TABS tablet, Take 1 tablet (40 mg total) by mouth daily with breakfast., Disp: 90 tablet, Rfl: 1 .  medroxyPROGESTERone (DEPO-PROVERA) 150 MG/ML injection, Inject 1 mL (150 mg total) into the muscle every 3 (three) months., Disp: 1 mL, Rfl: 3 .  ondansetron (ZOFRAN) 4 MG tablet, Take one tablet every 8 hours as needed. #10 to last 30 days. For migraines., Disp: 10 tablet, Rfl: 5 .  promethazine (PHENERGAN) 25 MG tablet, Take 1 tablet (25 mg total) by mouth every 8 (eight) hours as needed (migraines). #10 per 30 days., Disp: 10 tablet, Rfl: 5 .  rizatriptan (MAXALT) 10 MG tablet,  Take 1 tab at onset of migraine.  May repeat in 2 hrs, if needed.  Max dose: 2 tabs/day. This is a 30 day prescription., Disp: 15 tablet, Rfl: 5 .  tiZANidine (ZANAFLEX) 4 MG tablet, Take 1 tablet (4 mg total) by mouth every 6 (six) hours as needed for muscle spasms., Disp: 30 tablet, Rfl: 3 .  topiramate (TOPAMAX) 50 MG tablet, TAKE 2 TABLETS (100 MG TOTAL) BY MOUTH 2 (TWO) TIMES DAILY. PLEASE CALL [phone] TO SCHEDULE FOLLOW UP., Disp: 360 tablet, Rfl: 0 .  venlafaxine XR (EFFEXOR-XR) 75 MG 24 hr capsule, TAKE 1 CAPSULE (75 MG TOTAL) BY MOUTH DAILY WITH BREAKFAST., Disp: 90 capsule, Rfl: 4 .  propranolol ER (INDERAL LA) 80 MG 24 hr capsule, Take 1 capsule (80 mg total) by mouth daily. (Patient not taking: Reported on 01/23/2019), Disp: 30 capsule, Rfl: 11   Allergies  Allergen Reactions  . Imitrex [Sumatriptan] Other (See Comments)    Heart palpitations, hot/cold sweats  . Demerol [Meperidine] Other (See Comments)  . Demerol [Meperidine]   . Flagyl [Metronidazole] Hives     Family History  Problem Relation Age of Onset  . Cancer Mother 58       breast  . Breast cancer Mother 38  . Cancer Sister 39  . Breast cancer Sister 55  . Kidney disease Son   . Heart attack Maternal Grandfather   . Heart attack Paternal Grandmother   . Heart attack Paternal Grandfather      Social History   Socioeconomic History  . Marital status: Married    Spouse name: [name]  . Number of children: 3  . Years of education: 36  . 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 Financial resource strain: Not on file  . Food insecurity    Worry: Not on file    Inability: Not on file  . Transportation needs    Medical: Not on file    Non-medical: Not on file  Tobacco Use  . Smoking status: Never Smoker  . Smokeless tobacco: Never Used  Substance and Sexual Activity  . Alcohol use: Yes    Alcohol/week: 0.0 standard drinks    Comment:  occasionally  . Drug use: No  . Sexual activity: Yes    Partners: Male    Birth control/protection: Surgical    Comment: tubaligation  Lifestyle  . Physical activity    Days per week: Not on file    Minutes per session: Not on file  . Stress: Not on file  Relationships  . Social Herbalist on phone: Not on file    Gets together: Not on file    Attends religious service: Not on file    Active member of club or organization: Not on  file    Attends meetings of clubs or organizations: Not on file    Relationship status: Not on file  . Intimate partner violence    Fear of current or ex partner: Not on file    Emotionally abused: Not on file    Physically abused: Not on file    Forced sexual activity: Not on file  Other Topics Concern  . Not on file  Social History Narrative   ** Merged History Encounter **       ** Data from: 02/09/14 Enc Dept: [facility]       ** Data from: 11/10/13 Enc Dept: [facility]   Patient lives at home with [name]   Patient works full time.   Right handed.   Caffeine- none   Education- high school    I personally reviewed active problem list, medication list, allergies, family history, social history, notes from last encounter, lab results with the patient/caregiver today.  Review of Systems  Constitutional: Negative.   HENT: Negative.   Eyes: Negative.   Respiratory: Negative.   Cardiovascular: Negative.   Gastrointestinal: Negative.   Endocrine: Negative.   Genitourinary: Negative.   Musculoskeletal: Negative.   Skin: Negative.   Allergic/Immunologic: Negative.   Neurological: Negative.   Hematological: Negative.   Psychiatric/Behavioral: Negative.   All other systems reviewed and are negative.      Objective:   Vitals:   01/23/19 1500  BP: 128/84  Pulse: 90  Resp: 14  Temp: 98.1 F (36.7 C)  SpO2: 97%  Weight: 198 lb 11.2 oz (90.1 kg)  Height: 5\' 8"  (1.727 m)    Body mass index is 30.21  kg/m.  Physical Exam Vitals signs and nursing note reviewed.  Constitutional:      General: She is not in acute distress.    Appearance: She is well-developed. She is not ill-appearing, toxic-appearing or diaphoretic.     Interventions: Face mask in place.     Comments: Patient wore sunglasses through entire office visit and physical exam, NAD, appears stated age  HENT:     Head: Normocephalic and atraumatic.     Right Ear: External ear normal.     Left Ear: External ear normal.     Mouth/Throat:     Mouth: Mucous membranes are moist.     Pharynx: Oropharynx is clear. No oropharyngeal exudate or posterior oropharyngeal erythema.  Eyes:     General: Lids are normal. No scleral icterus.       Right eye: No discharge.        Left eye: No discharge.     Conjunctiva/sclera: Conjunctivae normal.  Neck:     Musculoskeletal: Normal range of motion and neck supple.     Trachea: Phonation normal. No tracheal deviation.  Cardiovascular:     Rate and Rhythm: Normal rate and regular rhythm.     Pulses: Normal pulses.          Radial pulses are 2+ on the right side and 2+ on the left side.       Posterior tibial pulses are 2+ on the right side and 2+ on the left side.     Heart sounds: Normal heart sounds. No murmur. No friction rub. No gallop.   Pulmonary:     Effort: Pulmonary effort is normal. No respiratory distress.     Breath sounds: Normal breath sounds. No stridor. No wheezing, rhonchi or rales.  Chest:     Chest wall: No tenderness.  Abdominal:     General: Bowel sounds are normal. There is no distension.     Palpations: Abdomen is soft.     Tenderness: There is no abdominal tenderness. There is no guarding or rebound.  Musculoskeletal: Normal range of motion.        General: No deformity.     Right lower leg: No edema.     Left lower leg: No edema.  Lymphadenopathy:     Cervical: No cervical adenopathy.  Skin:    General: Skin is warm and dry.     Capillary Refill:  Capillary refill takes less than 2 seconds.     Coloration: Skin is not jaundiced or pale.     Findings: No rash.  Neurological:     Mental Status: She is alert.     Motor: No abnormal muscle tone.     Coordination: Coordination normal.     Gait: Gait normal.  Psychiatric:        Attention and Perception: Attention normal.        Mood and Affect: Affect is angry (slightly).        Speech: Speech normal.        Behavior: Behavior is agitated (slightly).      Results for orders placed or performed in visit on 05/27/18  HM PAP SMEAR  Result Value Ref Range   HM Pap smear negative/HPV-negative    ECG interpretation   Date: 01/24/19  Rate: 83  Rhythm: normal sinus rhythm  QRS Axis: normal - no QTc prolongation  Intervals: normal  ST/T Wave abnormalities: non-specific abnormalities  Conduction Disutrbances: artifact despite multiple readings printed  Old EKG Reviewed: No significant changes noted        Assessment & Plan:      ICD-10-CM   1. Fatigue, unspecified type  123456 COMPLETE METABOLIC PANEL WITH GFR    Lipid panel    TSH    Hemoglobin A1c    CBC with Differential/Platelet    T4   r/o hypothyroid, anemia - wants hormone testing - referred back to obgyn  2. Bipolar disorder, in partial remission, most recent episode depressed ([place])  F31.75    Managed by specialist  3. Medication monitoring encounter  Z51.81    With medications checking kidney function, liver function and cell counts, electrolytes, cholesterol levels, EKG  4. Screening for endocrine, metabolic and immunity disorder  Z13.29    Z13.228    Z13.0   5. Screening for lipoid disorders  Z13.220   6. Screening for deficiency anemia  Z13.0   7. Encounter to establish care with new doctor  Z76.89         [name], PA-C 01/23/19 3:12 PM

## 2019-01-23 NOTE — Patient Instructions (Addendum)
We will check your kidney function, liver function, thyroid hormones, A1C - for diabetes, make sure you are not anemic.    I recommend seeing your OB/GYN for any hormone testing you desire since you are taking hormones and that is their specialty.  I am doing a lot of screening labs today, but since you are a new patient to me, I have to get to know you, review your chart, and have you come back for an appointment dedicated to your wellness/complete physical exam.  It is our clinic policy that the annual physicals are not done with initial visits for new patients, or returning patients.    Please schedule that whenever you would like.  With latuda - the recommended monitoring is for almost all the labs we are doing today - to check for diabetes, cholesterol levels, kidney function, liver function, electrolytes, CBC - anemia.  It also states to monitor the following:  changes in menstruation, libido, development of galactorrhea, erectile and ejaculatory function (at each visit for the first 12 weeks after the antipsychotic is initiated or until the dose is stable, then yearly)"  So I definitely recommend that consult with your GYN to check on your symptoms especially with your meds.

## 2019-01-24 ENCOUNTER — Encounter: Payer: Self-pay | Admitting: Family Medicine

## 2019-01-24 ENCOUNTER — Other Ambulatory Visit: Payer: Self-pay | Admitting: Family Medicine

## 2019-01-24 LAB — HEMOGLOBIN A1C
Hgb A1c MFr Bld: 4.6 % of total Hgb (ref <5.7)
Mean Plasma Glucose: 85 (calc)
eAG (mmol/L): 4.7 (calc)

## 2019-01-24 LAB — CBC WITH DIFFERENTIAL/PLATELET
Absolute Monocytes: 619 cells/uL (ref 200–950)
Basophils Absolute: 73 cells/uL (ref 0–200)
Basophils Relative: 0.8 %
Eosinophils Absolute: 300 cells/uL (ref 15–500)
Eosinophils Relative: 3.3 %
HCT: 45.3 % — ABNORMAL HIGH (ref 35.0–45.0)
Hemoglobin: 15.3 g/dL (ref 11.7–15.5)
Lymphs Abs: 3804 cells/uL (ref 850–3900)
MCH: 29.9 pg (ref 27.0–33.0)
MCHC: 33.8 g/dL (ref 32.0–36.0)
MCV: 88.6 fL (ref 80.0–100.0)
MPV: 8.7 fL (ref 7.5–12.5)
Monocytes Relative: 6.8 %
Neutro Abs: 4304 cells/uL (ref 1500–7800)
Neutrophils Relative %: 47.3 %
Platelets: 313 10*3/uL (ref 140–400)
RBC: 5.11 10*6/uL — ABNORMAL HIGH (ref 3.80–5.10)
RDW: 12.8 % (ref 11.0–15.0)
Total Lymphocyte: 41.8 %
WBC: 9.1 10*3/uL (ref 3.8–10.8)

## 2019-01-24 LAB — COMPLETE METABOLIC PANEL WITH GFR
AG Ratio: 1.6 (calc) (ref 1.0–2.5)
ALT: 15 U/L (ref 6–29)
AST: 10 U/L (ref 10–30)
Albumin: 4.5 g/dL (ref 3.6–5.1)
Alkaline phosphatase (APISO): 47 U/L (ref 31–125)
BUN: 14 mg/dL (ref 7–25)
CO2: 26 mmol/L (ref 20–32)
Calcium: 9.6 mg/dL (ref 8.6–10.2)
Chloride: 106 mmol/L (ref 98–110)
Creat: 0.97 mg/dL (ref 0.50–1.10)
GFR, Est African American: 83 mL/min/{1.73_m2} (ref >=60)
GFR, Est Non African American: 72 mL/min/{1.73_m2} (ref >=60)
Globulin: 2.8 g/dL (calc) (ref 1.9–3.7)
Glucose, Bld: 88 mg/dL (ref 65–99)
Potassium: 3.6 mmol/L (ref 3.5–5.3)
Sodium: 138 mmol/L (ref 135–146)
Total Bilirubin: 0.5 mg/dL (ref 0.2–1.2)
Total Protein: 7.3 g/dL (ref 6.1–8.1)

## 2019-01-24 LAB — T4: T4, Total: 8.3 ug/dL (ref 5.1–11.9)

## 2019-01-24 LAB — TSH: TSH: 2.35 mIU/L

## 2019-01-24 LAB — LIPID PANEL
Cholesterol: 212 mg/dL — ABNORMAL HIGH (ref <200)
HDL: 40 mg/dL — ABNORMAL LOW (ref >=50)
LDL Cholesterol (Calc): 151 mg/dL (calc) — ABNORMAL HIGH
Non-HDL Cholesterol (Calc): 172 mg/dL (calc) — ABNORMAL HIGH (ref <130)
Total CHOL/HDL Ratio: 5.3 (calc) — ABNORMAL HIGH (ref <5.0)
Triglycerides: 98 mg/dL (ref <150)

## 2019-01-24 MED ORDER — ALBUTEROL SULFATE HFA 108 (90 BASE) MCG/ACT IN AERS: 2.0000 | INHALATION_SPRAY | RESPIRATORY_TRACT | 2 refills | Status: DC | PRN

## 2019-01-24 NOTE — Telephone Encounter (Signed)
Pt had an appt yesterday with leisa and she is waiting on the refill on albuterol inhaler. [facility] [place] [address]

## 2019-01-24 NOTE — Telephone Encounter (Signed)
I T'ed that up for you yesterday with her chart? Here it is again

## 2019-01-26 NOTE — Progress Notes (Signed)
Virtual Visit via Video Note  I connected with Anita Weaver on 01/26/19 at  2:45 PM EDT by a video enabled telemedicine application and verified that I am speaking with the correct person using two identifiers.  Location: Patient: At her home Provider: In the office    I discussed the limitations of evaluation and management by telemedicine and the availability of in person appointments. The patient expressed understanding and agreed to proceed.  History of Present Illness: HISTORY OF PRESENT ILLNESS:Anita Weaver is a 43 years old right-handed Caucasian female, accompanied by her husband, referred by her primary care physician Dr. Manuella Weaver for evaluation of migraine headaches  She reported a history of migraine headaches since age 56, at that time, she also has excessive stress, for a while, she been having headaches daily, since her separation with her previous husband, her headache has much improved, doing very well from 2013-2014, but since October 2014, she began to get migraine again, gradually getting worse, to almost daily basis, she has been taking Excedrin Migraine every day 2-4 tablets, her migraine headaches are usually lateralized severe pounding headaches with associated light noise sensitivity, nauseous, lasting for days. Previously she has tried different preventive medications including Topamax, moderate help, nortriptyline, no help, there was an 1 medication prescribed by her psychologist for her bipolar disorder, Depakote has been very helpful for him migraine Over-the-counter Tylenol, ibuprofen and is no longer helpful, Excedrin migraine only provide mild temporary relief, she has tried triptan treatment in the past, Imitrex causing heart racing, sweaty, difficulty breathing, Maxalt has been very helpful, but she was not able afford it because the high co-pay, Flexeril has dade her sleepy Trigger for her migraines, weather change, stress, caffeine, sleep deprivation  She had tubal  ligations now, she also complains of worsening depression over the past few months, will set up appointment with her psychologist, she is not not on any medications  UPDATE August 3rd 2015: She came in with her husband today, She has tried Depakote ER 500mg  qhs for almost 2 months without helping, worry about side effect, Inderal 40mg  bid did not help either.  She still complains of headaches 20 days in a month, Maxalt works well, headache would go away in 30 minutes, she used it up all 15 tabs of Maxalt in one month. She also complains of dizziness, spinning sensation, fall over, slurred speech  MRI of the brain in September 2015 was essentially normal. She is not on any preventive medications now, for a while, her migraines well controlled by Maxalt, but since May 2016, she began to have frequent, almost daily headaches, left retro-orbital area moderate to severe pounding headache with associated light noise sensitivity, she been taking almost daily Maxalt, Which helps her, but only temporarily, She does not want to consider Botox injection as preventive medications, also complains of midline neck pain, achy sensation, no radiating pain, no bilateral upper extremity paresthesia and weakness. Topamax 100mg  bid works for her somewhat in the past, wants to be back on it  UPDATE May 29 2017: She is accompanied by her husband at today's clinical visit, she complains of frequent migraine headaches, about 14 headaches in a month, usually triggered by caffeine intake, but despite tapering off of her caffeine intake, she has frequent migraine headaches, call office multiple times require IV infusion, this particular headache has been ongoing for 2 weeks, she has tried multiple dose almost daily dose of Maxalt without breaking her headache.  Last iv infusion on Jan  9th, she used up all her 15 tablets of Maxalt each month, call the office multiple times requiring preauthorization, early refill, did respond  to Depacon IV infusion along with Toradol, and Phenergan.  UPDATE Feb 25 2018: She started Aimovig 70mg  SQ since May 2019, initially it helped her very well, she barely has any migraine from May to August 2019, with weather change in September 2019, she began to have frequent migraine headaches again, received IV Depacon infusion on September 4, which helped her migraine very well, is to come back for second IV infusion on February 18, 2018, but she continue have daily headaches over past 1 week, use daily Maxalt.  Lateralized severe headache with associated light noise sensitivity, nauseous  VV 07/25/2018 Dr. Krista Weaver: History of Present Illness: She continue complains of frequent headaches, despite higher dose of aimovig 140 mg every month, Effexor 37.5 mg daily, Topamax 100 mg twice a day,  She reported more headaches day than not in a month, Maxalt only provide partial help,  Over the past month, she asked multiple times for urgent headache treatment.  January 27, 2019 SS: She was referred to the headache clinic at [facility], but did not follow through with referral due to a long wait time before she could be seen, along with it was far from her home.  She was last given IV infusion at this office in June and July 2020.  After infusions, she says she will do well for a few weeks, then the headaches return.  She has not been willing to try Botox in the past.  She tried Relpax without benefit, was switched back to Maxalt.  At current, she remains on Aimovig 140 mg monthly injection, Topamax 100 mg twice a day, Effexor 75 mg daily, she was started on propanolol, but did not fill the medication due to cost.  As of recent, she reports daily headaches.  She does work full-time, she has not missed any work as result of her headaches.  She is also prescribed tizanidine and Zofran from this office.  She presents today for follow-up via virtual visit   Observations/Objective: Via virtual visit, is alert and  oriented, answers questions appropriately, speech is clear and concise, facial symmetry noted, commands, pleasant, participatory in visit, no problems with gait  Assessment and Plan: 1.  Chronic migraine headache -As of recent she reports daily headache, tried to refer to headache clinic, but she reports long wait time, long distance from her home -She indicates suboptimal response to increased dose of Aimovig 140 mg, we will switch to Emgality 120 mg monthly injection to see if better benefit -Continue Topamax 100 mg twice a day (we may discontinue this in the future, she questions if benefit) -She did not start propanolol LA 80 mg daily after last visit due to cost, may consider retry this at next visit, try propanolol 40 mg twice a day -Continue Effexor 75 mg daily -Continue Maxalt, Zofran, tizanidine as needed (asking for 34-month supply of Maxalt to be sent to [facility] because she pays cash, I do not see any record 3 months being sent, need to check with Dr. Rhea Belton nurse) -She likely has a component of medicine rebound headache with her frequent triptan use, should only treat moderate to severe headaches 2-3 times a week -I have encouraged her to consider Botox as an option, as we have exhausted several treatment options -Return in 3 months or sooner if needed  Follow Up Instructions: 3 months 05/08/2019 3:15  I discussed the assessment and treatment plan with the patient. The patient was provided an opportunity to ask questions and all were answered. The patient agreed with the plan and demonstrated an understanding of the instructions.   The patient was advised to call back or seek an in-person evaluation if the symptoms worsen or if the condition fails to improve as anticipated.  I provided 15 minutes of non-face-to-face time during this encounter.  Evangeline Dakin, DNP  [facility] [address], [place], [place] [address] [phone]

## 2019-01-27 ENCOUNTER — Encounter: Payer: Self-pay | Admitting: Neurology

## 2019-01-27 ENCOUNTER — Telehealth (INDEPENDENT_AMBULATORY_CARE_PROVIDER_SITE_OTHER): Payer: 59 | Admitting: Neurology

## 2019-01-27 DIAGNOSIS — G43909 Migraine, unspecified, not intractable, without status migrainosus: Secondary | ICD-10-CM

## 2019-01-27 MED ORDER — EMGALITY 120 MG/ML ~~LOC~~ SOAJ: 120.0000 mg | SUBCUTANEOUS | 11 refills | Status: DC

## 2019-01-27 NOTE — Progress Notes (Signed)
I have reviewed and agreed above plan. 

## 2019-01-28 ENCOUNTER — Telehealth: Payer: Self-pay | Admitting: Neurology

## 2019-01-28 MED ORDER — RIZATRIPTAN BENZOATE 10 MG PO TABS: ORAL_TABLET | ORAL | 1 refills | Status: DC

## 2019-01-28 NOTE — Telephone Encounter (Signed)
I called and LMVM for pt about the 90 day supply of maxalt and did send to [facility], cancelled the 30 day order and refills.  Relayed also the message concerning botox.  Pt to call back if questions.

## 2019-01-28 NOTE — Addendum Note (Signed)
Addended by: Brandon Melnick on: 01/28/2019 09:08 AM   Modules accepted: Orders

## 2019-01-28 NOTE — Telephone Encounter (Signed)
I did VV with this patient yesterday. In the past Dr. Krista Blue has sent Maxalt to [facility] for patient as 45 tablets, 90 day supply, due to decreased cost. It looks like it was sent on 12/31/2018 15 tablets, 30 days, 5 refills. It is okay to send as 90 day supply, but we may need to pull prescriptions on file.   Also, tell her I checked with our Botox coordinator. Yesterday she was asking about cost of Botox, I am told we cannot determine exact cost unless we make a request through her health insurance and speciality pharmacy. She wasn't interested in botox at this time, but was wondering as FYI.

## 2019-01-28 NOTE — Telephone Encounter (Addendum)
I called pt again.  Relayed to her that new prescription sent in to [facility] for 90 day supply.  [facility] to [facility] the 30 day prescription done yesterday.  Pt asked if botox lasts 3-4 months.   I relayed that each pt is different, every 3 months is the usual time frame for getting botox.  It may not last even that long.  She will research more and call us back.

## 2019-01-29 ENCOUNTER — Encounter: Payer: Self-pay | Admitting: *Deleted

## 2019-01-29 ENCOUNTER — Telehealth: Payer: Self-pay | Admitting: *Deleted

## 2019-01-29 NOTE — Telephone Encounter (Signed)
Started [employer] PA om CMM, key; [id number], dx: G43.709, G43.909, tried/failed: aimovig, topamax, relpax, imitrex, nortriptyline, ibuprofen, APAP, Excedrin, Inderal, Depakote, flexeril.  [facility] is reviewing your PA request. Typically an electronic response will be received within 72 hours. To check for an update later, open this request from your dashboard.

## 2019-01-30 ENCOUNTER — Other Ambulatory Visit: Payer: Self-pay | Admitting: Gastroenterology

## 2019-01-31 ENCOUNTER — Telehealth: Payer: Self-pay | Admitting: Neurology

## 2019-01-31 NOTE — Telephone Encounter (Signed)
Pt called stating that her insurance denied her Galcanezumab-gnlm (EMGALITY) 120 MG/ML SOAJ and she is wanting to know what else can be called in for her. Please advise.

## 2019-02-03 ENCOUNTER — Telehealth: Payer: Self-pay | Admitting: Neurology

## 2019-02-03 DIAGNOSIS — G43909 Migraine, unspecified, not intractable, without status migrainosus: Secondary | ICD-10-CM

## 2019-02-03 NOTE — Telephone Encounter (Addendum)
Received approval for emaglity 120mg /ml once every 30 days. Clare [id number].  [phone].

## 2019-02-03 NOTE — Telephone Encounter (Signed)
I see emgality denied.  Due to chronic migraines criteria.  I need to know who many migraines in month.  I see she has daily headaches.

## 2019-02-03 NOTE — Telephone Encounter (Signed)
Per CMM, Emgality denied. The request for coverage for Emgality 120mg /mL, use as directed (1 per month), is denied. This decision is based on health plan criteria for Emgality. This medicine is covered only if: You have a diagnosis of chronic migraines with both of the following: (i) Greater than or equal to 15 headache days per month. (ii) Greater than or equal to 8 migraine days per month. The information provided does not show that you meet the criteria listed above. Denial letter given to [employer]. Please see other phone note dated 01/31/2019.

## 2019-02-03 NOTE — Telephone Encounter (Signed)
She is here for migraine infusion.  She had question about waking up from nightmare with migraine, but also feeling hot sensation when has migraine.  She does not take her temp. ?  If migraine related.

## 2019-02-03 NOTE — Telephone Encounter (Signed)
Pt called in wanting to know if she can come in for a infusion today

## 2019-02-03 NOTE — Telephone Encounter (Signed)
Per [facility], they received a phone call from this patient's husband. The infusion suite was able to get Depacon and it was delivered today.  The patient's husband said she would rather have an IV for her acute migraine treatment.  Per vo by Dr. Krista Blue, ok to come in for the following:  1) Depakote 1 gram, IV 2) Toradol 30mg , IV  May repeat once, on following day, if migraine persist.  I called the patient back and she will arrive to our office today at 3:15pm.

## 2019-02-03 NOTE — Telephone Encounter (Signed)
I spoke to pt and she states has over 8 migraines a month.  I called [facility] and they stated to redo the PA.  I redid PA on [employer] Key [id number].  Determination pending.

## 2019-02-03 NOTE — Telephone Encounter (Signed)
LMVM for pt that returning call per SS/NP that a nerve block by Dr. Krista Blue or oral steroids if not diabetic. Please return call.

## 2019-02-03 NOTE — Telephone Encounter (Signed)
Please call the patient. We do not have any Depacon, is on [employer], television/film/video. For her headache, we may give oral steroids, if not diabetic, or she may be added to Dr. Rhea Belton schedule for nerve block if appointment is available.

## 2019-02-04 NOTE — Telephone Encounter (Signed)
I called pt and relayed that emgality is approved.  (this approval sent to [facility]. Also relayed information about nightmares triggering migraine (will monitor). If she has questions may call back.

## 2019-02-04 NOTE — Telephone Encounter (Signed)
Noted about the nightmares and migraines. It may be that during a nightmare, her muscles tense up, creating an opportunity to trigger a migraine. We now have record in the chart, and can follow overtime to see if she has any impaired sleep patterns prompting need for sleep evaluation.

## 2019-02-12 NOTE — Telephone Encounter (Signed)
She just had two recent infusions but her migraine never fully resolved like it normally does with this type of treatment.  She is asking to try a third infusion after the pain has failed to respond to her home medications.    Per vo by Dr. Krista Blue, okay to come in for one time infusion of the following:  1) Depakote 1 gram 2) Toradol 20mg   She will arrive at 3:15pm.  Signed MD orders provided to [facility].  She also reported that she just took her first injection of Emgality.

## 2019-02-12 NOTE — Telephone Encounter (Signed)
Pt is calling in wanting to know if she can come in today and get another infusion due to her cycle not breaking, she states she can receive a call back on her work phone

## 2019-02-19 ENCOUNTER — Other Ambulatory Visit: Payer: Self-pay

## 2019-02-19 ENCOUNTER — Ambulatory Visit (INDEPENDENT_AMBULATORY_CARE_PROVIDER_SITE_OTHER): Payer: 59 | Admitting: *Deleted

## 2019-02-19 VITALS — BP 118/82 | HR 88

## 2019-02-19 DIAGNOSIS — Z3042 Encounter for surveillance of injectable contraceptive: Secondary | ICD-10-CM | POA: Diagnosis not present

## 2019-02-19 MED ORDER — MEDROXYPROGESTERONE ACETATE 150 MG/ML IM SUSP
150.0000 mg | Freq: Once | INTRAMUSCULAR | Status: AC
  Administered 2019-02-19: 150 mg via INTRAMUSCULAR

## 2019-02-19 NOTE — Progress Notes (Signed)
Date last pap: 11/2016. Last Depo-Provera: 12/04/2018. Side Effects if any: None. Serum HCG indicated? NA. Depo-Provera 150 mg IM given by: Crosby Oyster, RN. Next appointment due Jan 27-Feb 10.

## 2019-02-19 NOTE — Progress Notes (Signed)
ATTESTATION OF SUPERVISION OF RN: Evaluation and management procedures were performed by the RN under my supervision and collaboration. I have reviewed the nursing note and chart and agree with the management and plan for this patient.  Samantha Weinhold, CNM  

## 2019-02-20 ENCOUNTER — Other Ambulatory Visit: Payer: Self-pay | Admitting: Neurology

## 2019-02-26 NOTE — Telephone Encounter (Signed)
Pt can be reached at work

## 2019-02-26 NOTE — Telephone Encounter (Signed)
I have spoken with the patient. 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  She is taking rizatriptan everyday.  She had three infusions of Depakote and Toradol in one month.  States trigger point injections do not help.  She is currently on a CGRP and oral preventive agents.  She has failed many medications in the past.    This information has been discussed with Dr. Krista Blue.  The patient was instructed to stop the overuse of her medications.  She is likely causing the headache to worsen by taking too much.  Also, Dr. Krista Blue is going to refer her to the [facility], Dr. Domingo Cocking.  The patient verbalized understanding and is in agreement with this plan.

## 2019-02-26 NOTE — Addendum Note (Signed)
Addended by: Noberto Retort C on: 02/26/2019 11:10 AM   Modules accepted: Orders

## 2019-02-26 NOTE — Telephone Encounter (Signed)
Pt called in and stated that she is still having the migraine she has had a 3rd infusion and she is wanting to know what she can do to break her cycle.

## 2019-02-27 MED ORDER — VENLAFAXINE HCL ER 150 MG PO TB24: 1.0000 | ORAL_TABLET | Freq: Every day | ORAL | 0 refills | Status: DC

## 2019-02-27 NOTE — Addendum Note (Signed)
Addended by: Noberto Retort C on: 02/27/2019 04:49 PM   Modules accepted: Orders

## 2019-02-27 NOTE — Telephone Encounter (Signed)
The patient would like to know if she can take tizanidine at bedtime.  She is having difficulty sleeping with her migraine.  Per vo by Dr. Krista Blue, she can take her tizanidine and also add OTC melatonin at bedtime.  Dr. Krista Blue has also offered to increase her venlafaxine ER from 75mg  daily to 150mg  daily.  The patient is agreeable to the increase.  States she has a pending appt with Dr. Domingo Cocking, at the [facility], on 03/11/2019.

## 2019-02-27 NOTE — Telephone Encounter (Addendum)
Patient called stating that she would like to speak to someone in regards to the medication that she is taking states it is in regards to her migraines. Please follow up.

## 2019-03-11 ENCOUNTER — Other Ambulatory Visit: Payer: Self-pay | Admitting: Neurology

## 2019-04-10 ENCOUNTER — Ambulatory Visit: Payer: 59 | Attending: Internal Medicine

## 2019-04-10 DIAGNOSIS — Z20822 Contact with and (suspected) exposure to covid-19: Secondary | ICD-10-CM

## 2019-04-14 ENCOUNTER — Telehealth: Payer: Self-pay

## 2019-04-14 NOTE — Telephone Encounter (Signed)
Pt was looking for coivd test results. Results still pending. Encouraged pt to check mychart for update.   [place]

## 2019-04-15 ENCOUNTER — Other Ambulatory Visit: Payer: 59

## 2019-04-15 ENCOUNTER — Ambulatory Visit: Payer: 59 | Attending: Internal Medicine

## 2019-04-15 DIAGNOSIS — Z20822 Contact with and (suspected) exposure to covid-19: Secondary | ICD-10-CM

## 2019-04-16 LAB — NOVEL CORONAVIRUS, NAA

## 2019-04-17 ENCOUNTER — Encounter: Payer: Self-pay | Admitting: Family Medicine

## 2019-04-17 ENCOUNTER — Ambulatory Visit (INDEPENDENT_AMBULATORY_CARE_PROVIDER_SITE_OTHER): Payer: 59 | Admitting: Family Medicine

## 2019-04-17 DIAGNOSIS — J4521 Mild intermittent asthma with (acute) exacerbation: Secondary | ICD-10-CM | POA: Diagnosis not present

## 2019-04-17 DIAGNOSIS — R058 Other specified cough: Secondary | ICD-10-CM

## 2019-04-17 DIAGNOSIS — R05 Cough: Secondary | ICD-10-CM

## 2019-04-17 LAB — NOVEL CORONAVIRUS, NAA: SARS-CoV-2, NAA: NOT DETECTED

## 2019-04-17 MED ORDER — BENZONATATE 100 MG PO CAPS: 100.0000 mg | ORAL_CAPSULE | Freq: Two times a day (BID) | ORAL | 0 refills | Status: DC | PRN

## 2019-04-17 MED ORDER — AZITHROMYCIN 250 MG PO TABS: ORAL_TABLET | ORAL | 0 refills | Status: DC

## 2019-04-17 MED ORDER — BREO ELLIPTA 100-25 MCG/INH IN AEPB: 1.0000 | INHALATION_SPRAY | Freq: Every day | RESPIRATORY_TRACT | 0 refills | Status: DC

## 2019-04-17 MED ORDER — PREDNISONE 10 MG PO TABS: 10.0000 mg | ORAL_TABLET | Freq: Every day | ORAL | 0 refills | Status: DC

## 2019-04-17 NOTE — Progress Notes (Signed)
Name: XYLINA LANDRENEAU   MRN: [id number]    DOB: [birth year]   Date:04/17/2019       Progress Note  Subjective  Chief Complaint  Chief Complaint  Patient presents with  . Cough    x 3 weeks. Productive cough with yellow mucous. Comes and goes. She is treating with Mucinex with fair improvement  . Shortness of Breath    Episodes occur mostly when she is sleeping. Wakes her up out of her sleep finds herself gasping for breath. Feels like lungs are filled with fluid. Onset x 1 week. She has used Albuterol inhaler once and it seems to help.    I connected with  Florina R Putz  on 04/17/19 at 10:40 AM EST by a video enabled telemedicine application and verified that I am speaking with the correct person using two identifiers.  I discussed the limitations of evaluation and management by telemedicine and the availability of in person appointments. The patient expressed understanding and agreed to proceed. Staff also discussed with the patient that there may be a patient responsible charge related to this service. Patient Location: at home  Provider Location: [place]   HPI  Cough : going on for 3 weeks, she states started with head cold, she was tested for COVID-19 a few days ago and it was negative. She states she also has asthma but usually very mild and over the past week she has noticed sob in the middle of the night. She used Albuterol last night and seemed to improve symptoms. She denies wheezing and no sob during the day. Denies calf pain or swelling. No chest pain or palpitation. Denies fever, chills or change in appetite. She has been feeling more tired than usual but slightly better today. She does not smoke   Patient Active Problem List   Diagnosis Date Noted  . Arthritis 07/09/2018  . Chronic interstitial cystitis 07/09/2018  . Irritable bowel syndrome 07/09/2018  . Scoliosis deformity of spine 07/09/2018  . Asthma 04/27/2017  . Acid reflux 04/27/2017  . Hiatal  hernia with GERD 04/27/2017  . Contact dermatitis 05/15/2016  . Fatigue 01/20/2016  . Mild dysplasia of cervix (CIN I) 09/29/2014  . Pap smear abnormality of cervix with LGSIL 09/21/2014  . FH: genetic disease carrier 04/22/2014  . Migraine headache 05/08/2013  . Bipolar disorder, unspecified ([place]) 05/08/2013  . Endometriosis 04/18/2012  . Chronic pelvic pain in female 04/18/2012  . Female genital symptoms 04/18/2012    Social History   Tobacco Use  . Smoking status: Never Smoker  . Smokeless tobacco: Never Used  Substance Use Topics  . Alcohol use: Yes    Alcohol/week: 0.0 standard drinks    Comment: occasionally     Current Outpatient Medications:  .  albuterol (PROAIR HFA) 108 (90 Base) MCG/ACT inhaler, Inhale 2 puffs into the lungs every 4 (four) hours as needed for wheezing or shortness of breath., Disp: 18 g, Rfl: 2 .  dexlansoprazole (DEXILANT) 60 MG capsule, Take 1 capsule (60 mg total) by mouth daily. **PLEASE SCHEDULE APPT FOR REFILLS**, Disp: 30 capsule, Rfl: 3 .  lurasidone (LATUDA) 40 MG TABS tablet, Take 1 tablet (40 mg total) by mouth daily with breakfast., Disp: 90 tablet, Rfl: 1 .  medroxyPROGESTERone (DEPO-PROVERA) 150 MG/ML injection, Inject 1 mL (150 mg total) into the muscle every 3 (three) months., Disp: 1 mL, Rfl: 3 .  ondansetron (ZOFRAN) 4 MG tablet, Take one tablet every 8 hours as needed. #10 to last  30 days. For migraines., Disp: 10 tablet, Rfl: 5 .  tiZANidine (ZANAFLEX) 4 MG tablet, Take 1 tablet (4 mg total) by mouth every 6 (six) hours as needed for muscle spasms., Disp: 30 tablet, Rfl: 3  Allergies  Allergen Reactions  . Imitrex [Sumatriptan] Other (See Comments)    Heart palpitations, hot/cold sweats  . Demerol [Meperidine] Other (See Comments)    hallucinations  . Flagyl [Metronidazole] Hives    I personally reviewed active problem list, medication list, allergies, family history, social history with the patient/caregiver  today.  ROS  Ten systems reviewed and is negative except as mentioned in HPI  Objective  Virtual encounter, vitals not obtained.  There is no height or weight on file to calculate BMI.  Nursing Note and Vital Signs reviewed.  Physical Exam  Awake, alert and oriented no distress  Results for orders placed or performed in visit on 04/15/19 (from the past 72 hour(s))  Novel Coronavirus, NAA ([facility])     Status: None   Collection Time: 04/15/19 10:20 AM   Specimen: Nasopharyngeal(NP) swabs in vial transport medium   NASOPHARYNGE  TESTING  Result Value Ref Range   SARS-CoV-2, NAA Not Detected Not Detected    Comment: This nucleic acid amplification test was developed and its performance characteristics determined by [employer]. Nucleic acid amplification tests include PCR and TMA. This test has not been FDA cleared or approved. This test has been authorized by FDA under an Emergency Use Authorization (EUA). This test is only authorized for the duration of time the declaration that circumstances exist justifying the authorization of the emergency use of in vitro diagnostic tests for detection of SARS-CoV-2 virus and/or diagnosis of COVID-19 infection under section 564(b)(1) of the Act, 21 U.S.C. [id number]) (1), unless the authorization is terminated or revoked sooner. When diagnostic testing is negative, the possibility of a false negative result should be considered in the context of a patient's recent exposures and the presence of clinical signs and symptoms consistent with COVID-19. An individual without symptoms of COVID-19 and who is not shedding SARS-CoV-2 virus would  expect to have a negative (not detected) result in this assay.     Assessment & Plan  1. Productive cough  - azithromycin (ZITHROMAX) 250 MG tablet; Take as directed  Dispense: 6 tablet; Refill: 0 - benzonatate (TESSALON) 100 MG capsule; Take 1-2 capsules (100-200 mg total) by mouth 2 (two)  times daily as needed.  Dispense: 40 capsule; Refill: 0  Explained that since she was not seen in person it is hard to tell if she has pneumonia, and since symptoms have been present for over 2 weeks and she has asthma we will add azithromycin. She will contact me back on Monday if not feeling significantly better   2. Mild intermittent asthma with exacerbation  - predniSONE (DELTASONE) 10 MG tablet; Take 1 tablet (10 mg total) by mouth daily with breakfast.  Dispense: 10 tablet; Refill: 0 - fluticasone furoate-vilanterol (BREO ELLIPTA) 100-25 MCG/INH AEPB; Inhale 1 puff into the lungs daily.  Dispense: 60 each; Refill: 0 - benzonatate (TESSALON) 100 MG capsule; Take 1-2 capsules (100-200 mg total) by mouth 2 (two) times daily as needed.  Dispense: 40 capsule; Refill: 0 Explained side effects of medications and the need to take prednisone with food   -Red flags and when to present for emergency care or RTC including fever >101.35F, chest pain, shortness of breath, new/worsening/un-resolving symptoms, reviewed with patient at time of visit. Follow up and care instructions  discussed and provided in AVS. - I discussed the assessment and treatment plan with the patient. The patient was provided an opportunity to ask questions and all were answered. The patient agreed with the plan and demonstrated an understanding of the instructions.  I provided 25  minutes of non-face-to-face time during this encounter.  Loistine Chance, MD

## 2019-04-22 ENCOUNTER — Other Ambulatory Visit: Payer: Self-pay | Admitting: Family Medicine

## 2019-04-22 ENCOUNTER — Ambulatory Visit
Admission: RE | Admit: 2019-04-22 | Discharge: 2019-04-22 | Disposition: A | Discharge status: Home / Self Care | Payer: 59 | Source: Intra-hospital | Attending: Family Medicine | Admitting: Family Medicine

## 2019-04-22 ENCOUNTER — Ambulatory Visit
Admission: RE | Admit: 2019-04-22 | Discharge: 2019-04-22 | Disposition: A | Discharge status: Home / Self Care | Payer: 59 | Source: Ambulatory Visit | Attending: Family Medicine | Admitting: Family Medicine

## 2019-04-22 ENCOUNTER — Telehealth: Payer: Self-pay | Admitting: Family Medicine

## 2019-04-22 ENCOUNTER — Other Ambulatory Visit: Payer: Self-pay

## 2019-04-22 DIAGNOSIS — R058 Other specified cough: Secondary | ICD-10-CM

## 2019-04-22 DIAGNOSIS — R05 Cough: Secondary | ICD-10-CM

## 2019-04-22 NOTE — Telephone Encounter (Signed)
Pt.notified

## 2019-04-22 NOTE — Telephone Encounter (Signed)
You saw her last 

## 2019-04-22 NOTE — Telephone Encounter (Signed)
Copied from [place] [phone] [id number]. Topic: General - Other >> Apr 22, 2019  8:14 AM Keene Breath wrote: Reason for CRM: Patient called to inform the doctor that she needs the chest x-ray because her condition is not getting any better.  Please schedule and call patient back to let her know at [phone] until 2:30 or [phone] after 2:30.

## 2019-04-23 ENCOUNTER — Telehealth: Payer: Self-pay

## 2019-04-23 NOTE — Telephone Encounter (Signed)
Patient is trying to figure out what is going on since chest x-ray was negative for pneumonia and COVID test was negative. She reports that when she lays down she feels like she is drowning in fluid. What is her dx and what is the next step for her. Please contact her at work @ [phone] until 2:30 or [phone] after 2:30.

## 2019-04-25 ENCOUNTER — Ambulatory Visit (INDEPENDENT_AMBULATORY_CARE_PROVIDER_SITE_OTHER): Payer: 59 | Admitting: Medical

## 2019-04-25 VITALS — BP 140/84 | HR 83 | Temp 98.6°F | Ht 68.0 in | Wt 205.4 lb

## 2019-04-25 DIAGNOSIS — R0602 Shortness of breath: Secondary | ICD-10-CM | POA: Diagnosis not present

## 2019-04-25 DIAGNOSIS — R05 Cough: Secondary | ICD-10-CM

## 2019-04-25 DIAGNOSIS — J45901 Unspecified asthma with (acute) exacerbation: Secondary | ICD-10-CM

## 2019-04-25 DIAGNOSIS — R0683 Snoring: Secondary | ICD-10-CM

## 2019-04-25 DIAGNOSIS — R5383 Other fatigue: Secondary | ICD-10-CM

## 2019-04-25 DIAGNOSIS — R058 Other specified cough: Secondary | ICD-10-CM

## 2019-04-25 NOTE — Patient Instructions (Signed)
Recommendations:  significantly increase your water intake, preferably 64 ounces or more daily  Limit or avoid soda  finish out the prednisone steroid  Continue Tessalon Perles for cough  Continue Breo for the next 2 weeks.   1 puff daily.  Gargle with water and swallow after use.  Use your albuterol rescue inhaler at bedtime, 2 puffs at bedtime  Otherwise use albuterol 2 puffs every 4-6 hours as needed for shortness of breath, chest tightness, cough fits, wheezing  Plan to contact your PCP within the next week to update them on your symptoms  Red flag symptom and signs:  Swelling or pain in your lower legs, particularly calves  Overall worsening of the night time symptoms despite these recommendations  Spike a fever over 101  If you cough up blood  worsening chest pain with activity  Weight gain of 5lb or more over the weekend

## 2019-04-25 NOTE — Progress Notes (Signed)
Subjective Chief Complaint  Patient presents with  . Cough   PCP: [name], PA-C  Here today at the [facility] for follow-up on cough and possible Covid infection.  Her symptoms began over 2 weeks ago.  Actually she had a cough around Christmas.  Her symptoms started with cough, dyspnea, chest pain, achiness in the legs, nausea, raspy cough, fatigue.  She had a negative Covid test about 5 days into her symptoms.  She had a virtual consult on April 17, 2019 with her primary care, and she ultimately had a chest x-ray on 2 days ago that was negative  Her main concern is that she feels worse at night.  Overall she has improved but she feels like she is drowning at night when she tries to sleep.  Her cough is mostly resolved.  She still has some chest discomfort.  She was prescribed [employer] which she has not been using that much.  She has albuterol inhaler, but has used it just a few times.  Prior to this illness her asthma has been mild, not having to use albuterol but rarely.  She describes her chest pressure or pain as heaviness like a lung infection, no associated nausea, sweats, jaw pain, arm pain, tingling, nausea.  The chest pain can be 20 minutes or can be a few hours.  She is mostly concerned about her lungs.  She is not worried about a cardiac source of her symptoms.  She denies sick contacts.  She denies calf pain, calf swelling, no history of pulmonary embolism or DVT.  No recent travel, surgeries or trauma.  She does snore sometimes, at times has had concerned about sleep apnea in the past with waking up gasping.  In the past 2 weeks with her symptoms she has woken up gasping at times.  She was recently put on Breo, Z-Pak and prednisone.  She finished the Z-Pak and is still finishing the prednisone, she continues on the [place].  Past Medical History:  Diagnosis Date  . Allergy   . Arthritis   . Asthma   . Chronic pelvic pain in female   .  Endometriosis   . FH: breast cancer in first degree relative    Mother and sister both are BRCA positive; patient is BRCA negative  . GERD (gastroesophageal reflux disease)   . Headache    migraines  . Heart murmur   . Hyperlipidemia   . Hypertension   . Interstitial cystitis   . Irritable bowel disease   . Neck pain    Current Outpatient Medications on File Prior to Visit  Medication Sig Dispense Refill  . albuterol (PROAIR HFA) 108 (90 Base) MCG/ACT inhaler Inhale 2 puffs into the lungs every 4 (four) hours as needed for wheezing or shortness of breath. 18 g 2  . azithromycin (ZITHROMAX) 250 MG tablet Take as directed 6 tablet 0  . benzonatate (TESSALON) 100 MG capsule Take 1-2 capsules (100-200 mg total) by mouth 2 (two) times daily as needed. 40 capsule 0  . dexlansoprazole (DEXILANT) 60 MG capsule Take 1 capsule (60 mg total) by mouth daily. **PLEASE SCHEDULE APPT FOR REFILLS** 30 capsule 3  . fluticasone furoate-vilanterol (BREO ELLIPTA) 100-25 MCG/INH AEPB Inhale 1 puff into the lungs daily. 60 each 0  . lurasidone (LATUDA) 40 MG TABS tablet Take 1 tablet (40 mg total) by mouth daily with breakfast. 90 tablet 1  . medroxyPROGESTERone (DEPO-PROVERA) 150 MG/ML injection Inject 1 mL (150 mg total) into  the muscle every 3 (three) months. 1 mL 3  . ondansetron (ZOFRAN) 4 MG tablet Take one tablet every 8 hours as needed. #10 to last 30 days. For migraines. 10 tablet 5  . predniSONE (DELTASONE) 10 MG tablet Take 1 tablet (10 mg total) by mouth daily with breakfast. 10 tablet 0  . tiZANidine (ZANAFLEX) 4 MG tablet Take 1 tablet (4 mg total) by mouth every 6 (six) hours as needed for muscle spasms. 30 tablet 3   No current facility-administered medications on file prior to visit.   ROS as in subjective   Objective: BP 140/84   Pulse 83   Temp 98.6 F (37 C)   Ht '5\' 8"'$  (1.727 m)   Wt 205 lb 6.4 oz (93.2 kg)   SpO2 97%   BMI 31.23 kg/m   Wt Readings from Last 3 Encounters:   04/25/19 205 lb 6.4 oz (93.2 kg)  01/23/19 198 lb 11.2 oz (90.1 kg)  12/04/18 194 lb 3.2 oz (88.1 kg)   BP Readings from Last 3 Encounters:  04/25/19 140/84  02/19/19 118/82  01/23/19 128/84   General appearence: alert, no distress, WD/WN, white female HEENT: normocephalic, sclerae anicteric, TMs pearly, nares patent, no discharge or erythema, pharynx normal Oral cavity: MMM, no lesions Neck: supple, no lymphadenopathy, no thyromegaly, no masses, no JVD Heart: RRR, normal S1, S2, no murmurs Lungs: relatively clear, neg egophany, no dullness to percussion, no wheezes, rhonchi, or rales Pulses: 2+ symmetric, upper and lower extremities, normal cap refill No LE edema, no calve tendnerss, no asymmetry, -homans     Assessment: Encounter Diagnoses  Name Primary?  . Productive cough Yes  . SOB (shortness of breath)   . Moderate asthma with exacerbation, unspecified whether persistent   . Fatigue, unspecified type   . Snoring      Plan: I reviewed her chart record, labs she had done on January 23, 2019 which were unremarkable for comprehensive metabolic panel and CBC.  I reviewed her virtual consult she had on April 17, 2019.  Reviewed her chest x-ray from April 23, 2019  Her vitals are stable, clinically she does not appear severely ill.   She likely has had Covid despite initial negative test.  Offered EKG but she declined.  Less worried about cardiac issues given her symptoms have all been consistent with illness worse the first week, gradually improving other than nighttime symptoms.  She appears to be a little dehydrated as well.  I advised that increased hydration and adding inhaler at bedtime more consistently will probably help.  We discussed lying inclined the next several nights.  We discussed proper use of inhaler.  We reviewed the instructions below.  She voiced understanding and agreement of plan.  We discussed symptoms that would prompt urgent recheck at the  emergency department.  Patient Instructions  Recommendations:  significantly increase your water intake, preferably 64 ounces or more daily  Limit or avoid soda  finish out the prednisone steroid  Continue Tessalon Perles for cough  Continue Breo for the next 2 weeks.   1 puff daily.  Gargle with water and swallow after use.  Use your albuterol rescue inhaler at bedtime, 2 puffs at bedtime  Otherwise use albuterol 2 puffs every 4-6 hours as needed for shortness of breath, chest tightness, cough fits, wheezing  Plan to contact your PCP within the next week to update them on your symptoms  Red flag symptom and signs:  Swelling or pain in your lower legs,  particularly calves  Overall worsening of the night time symptoms despite these recommendations  Spike a fever over 101  If you cough up blood  worsening chest pain with activity  Weight gain of 5lb or more over the weekend    Anita Weaver was seen today for cough.  Diagnoses and all orders for this visit:  Productive cough -     Novel Coronavirus, NAA ([facility])  SOB (shortness of breath)  Moderate asthma with exacerbation, unspecified whether persistent  Fatigue, unspecified type  Snoring

## 2019-04-27 LAB — NOVEL CORONAVIRUS, NAA: SARS-CoV-2, NAA: NOT DETECTED

## 2019-04-29 ENCOUNTER — Telehealth: Payer: Self-pay | Admitting: Family Medicine

## 2019-04-29 NOTE — Telephone Encounter (Signed)
-----   Message from Cathrine Muster, [place] sent at 04/28/2019  3:41 PM EST ----- Please schedule pt with sowles to follow-up if still having symtoms ----- Message ----- From: [name], PA-C Sent: 04/28/2019   2:15 PM EST To: Cathrine Muster, CMA  Jamie - please f/up with pt, Dr. Ancil Boozer saw her originially, multiple covid negative tests, I would want to see her in clinic to f/up this up if she is still having SOB or orthopnea.  thanks

## 2019-04-29 NOTE — Telephone Encounter (Signed)
lvm for scheduling °

## 2019-04-30 ENCOUNTER — Telehealth: Payer: Self-pay | Admitting: Gastroenterology

## 2019-04-30 NOTE — Telephone Encounter (Signed)
Patient called & states her  dexlansoprazole (DEXILANT) 60 MG    Doesn't seem to be working. She's having break through acid. Does Dr Allen Norris need to put her on something else. [facility] [place]

## 2019-05-01 NOTE — Telephone Encounter (Signed)
I called patient to let her know she needs to make an appointment with DR Allen Norris before any medication can be changed.

## 2019-05-01 NOTE — Telephone Encounter (Signed)
Please call pt back and advise her Dr. Allen Norris has requested a follow up visit since her medication is not working and she has not seen him since 01/02/2018 before any changes will be made. Thank you!

## 2019-05-01 NOTE — Telephone Encounter (Signed)
Pt is scheduled for  Apt 05/23/19 she needs something before this apt the rx Dexilint is not working and she almost had to go to ER her acid reflux is really bad please call pt

## 2019-05-02 ENCOUNTER — Other Ambulatory Visit: Payer: Self-pay

## 2019-05-02 MED ORDER — PANTOPRAZOLE SODIUM 40 MG PO TBEC: 40.0000 mg | DELAYED_RELEASE_TABLET | Freq: Two times a day (BID) | ORAL | 3 refills | Status: DC

## 2019-05-02 NOTE — Telephone Encounter (Signed)
Have the patient take protonix bid and if that does not work the a [place] study to make sure she is having acid reflux and not something else.

## 2019-05-02 NOTE — Telephone Encounter (Signed)
Left vm informing pt Dr. Allen Norris ok'd me to send Pantoprazole 40mg  BID. [facility] Requested pt to call and schedule sooner appt.

## 2019-05-02 NOTE — Telephone Encounter (Signed)
See messages below and advise.

## 2019-05-07 ENCOUNTER — Other Ambulatory Visit: Payer: Self-pay

## 2019-05-07 ENCOUNTER — Ambulatory Visit (INDEPENDENT_AMBULATORY_CARE_PROVIDER_SITE_OTHER): Payer: 59

## 2019-05-07 VITALS — BP 126/78 | HR 72

## 2019-05-07 DIAGNOSIS — Z3042 Encounter for surveillance of injectable contraceptive: Secondary | ICD-10-CM

## 2019-05-07 DIAGNOSIS — G4700 Insomnia, unspecified: Secondary | ICD-10-CM

## 2019-05-07 MED ORDER — MEDROXYPROGESTERONE ACETATE 150 MG/ML IM SUSP
150.0000 mg | Freq: Once | INTRAMUSCULAR | Status: AC
  Administered 2019-05-07: 150 mg via INTRAMUSCULAR

## 2019-05-07 NOTE — Progress Notes (Signed)
Patient presented to office today for her depo-provera injection. After reviewing patient chart she will need a well women check Given in right depo 150 mg muscle Dose: 150 mg Side Effects: none at this time  HCG Serum: N/A

## 2019-05-08 ENCOUNTER — Ambulatory Visit: Payer: Self-pay | Admitting: Neurology

## 2019-05-09 ENCOUNTER — Other Ambulatory Visit: Payer: Self-pay | Admitting: Gastroenterology

## 2019-05-10 ENCOUNTER — Other Ambulatory Visit: Payer: Self-pay | Admitting: Family Medicine

## 2019-05-10 DIAGNOSIS — J4521 Mild intermittent asthma with (acute) exacerbation: Secondary | ICD-10-CM

## 2019-05-12 ENCOUNTER — Other Ambulatory Visit: Payer: Self-pay

## 2019-05-12 ENCOUNTER — Ambulatory Visit: Payer: 59 | Admitting: Family Medicine

## 2019-05-12 ENCOUNTER — Encounter: Payer: Self-pay | Admitting: Family Medicine

## 2019-05-12 VITALS — BP 122/84 | HR 99 | Temp 98.9°F | Resp 14 | Ht 68.0 in | Wt 210.0 lb

## 2019-05-12 DIAGNOSIS — T7840XA Allergy, unspecified, initial encounter: Secondary | ICD-10-CM | POA: Diagnosis not present

## 2019-05-12 DIAGNOSIS — E559 Vitamin D deficiency, unspecified: Secondary | ICD-10-CM

## 2019-05-12 DIAGNOSIS — R6889 Other general symptoms and signs: Secondary | ICD-10-CM

## 2019-05-12 DIAGNOSIS — L509 Urticaria, unspecified: Secondary | ICD-10-CM

## 2019-05-12 DIAGNOSIS — R5383 Other fatigue: Secondary | ICD-10-CM

## 2019-05-12 DIAGNOSIS — R252 Cramp and spasm: Secondary | ICD-10-CM

## 2019-05-12 MED ORDER — HYDROXYZINE HCL 25 MG PO TABS: 25.0000 mg | ORAL_TABLET | Freq: Three times a day (TID) | ORAL | 1 refills | Status: DC | PRN

## 2019-05-12 MED ORDER — EPINEPHRINE 0.3 MG/0.3ML IJ SOAJ: 0.3000 mg | Freq: Once | INTRAMUSCULAR | 1 refills | Status: DC | PRN

## 2019-05-12 NOTE — Patient Instructions (Signed)
For rash or hives - take 50 mg benadryl and 40 mg pepcid by mouth  IF you have any progressing rash, swelling to face/mouth/lips with difficulty breathing or stridor     Hives Hives are itchy, red, swollen areas on your skin. Hives can show up on any part of your body. Hives often fade within 24 hours (acute hives). New hives can show up after old ones fade. This can go on for many days or weeks (chronic hives). Hives do not spread from person to person (are not contagious). Hives are caused by your body's response to something that you are allergic to (allergen). These are sometimes called triggers. You can get hives right after being around a trigger, or hours later. What are the causes?  Allergies to foods.  Insect bites or stings.  Pollen.  Pets.  Latex.  Chemicals.  Spending time in sunlight, heat, or cold.  Exercise.  Stress.  Some medicines.  Viruses. This includes the common cold.  Infections caused by germs (bacteria).  Allergy shots.  Blood transfusions. Sometimes, the cause is not known. What increases the risk?  Being a woman.  Being allergic to foods such as: ? Citrus fruits. ? Milk. ? Eggs. ? Peanuts. ? Tree nuts. ? Shellfish.  Being allergic to: ? Medicines. ? Latex. ? Insects. ? Animals. ? Pollen. What are the signs or symptoms?   Raised, itchy, red or white bumps or patches on your skin. These areas may: ? Get large and swollen. ? Change in shape and location. ? Stand alone or connect to each other over a large area of skin. ? Sting or hurt. ? Turn white when pressed in the center (blanch). In very bad cases, your hands, feet, and face may also get swollen. This may happen if hives start deeper in your skin. How is this treated? Treatment for this condition depends on your symptoms. Treatment may include:  Using cool, wet cloths (cool compresses) or taking cool showers to stop the itching.  Medicines that help: ? Relieve  itching (antihistamines). ? Reduce swelling (corticosteroids). ? Treat infection (antibiotics).  A medicine (omalizumab) that is given as a shot (injection). Your doctor may prescribe this if you have hives that do not get better even after other treatments.  In very bad cases, you may need a shot of a medicine called epinephrineto prevent a life-threatening allergic reaction (anaphylaxis). Follow these instructions at home: Medicines  Take or apply over-the-counter and prescription medicines only as told by your doctor.  If you were prescribed an antibiotic medicine, use it as told by your doctor. Do not stop using it even if you start to feel better. Skin care  Apply cool, wet cloths to the hives.  Do not scratch your skin. Do not rub your skin. General instructions  Do not take hot showers or baths. This can make itching worse.  Do not wear tight clothes.  Use sunscreen and wear clothes that cover your skin when you are outside.  Avoid any triggers that cause your hives. Keep a journal to help track what causes your hives. Write down: ? What medicines you take. ? What you eat and drink. ? What products you use on your skin.  Keep all follow-up visits as told by your doctor. This is important. Contact a doctor if:  Your symptoms are not better with medicine.  Your joints hurt or are swollen. Get help right away if:  You have a fever.  You have pain in your  belly (abdomen).  Your tongue or lips are swollen.  Your eyelids are swollen.  Your chest or throat feels tight.  You have trouble breathing or swallowing. These symptoms may be an emergency. Do not wait to see if the symptoms will go away. Get medical help right away. Call your local emergency services (911 in the [place] Do not drive yourself to the hospital. Summary  Hives are itchy, red, swollen areas on your skin.  Treatment for this condition depends on your symptoms.  Avoid things that cause your  hives. Keep a journal to help track what causes your hives.  Take and apply over-the-counter and prescription medicines only as told by your doctor.  Keep all follow-up visits as told by your doctor. This is important. This information is not intended to replace advice given to you by your health care provider. Make sure you discuss any questions you have with your health care provider. Document Revised: 10/10/2017 Document Reviewed: 10/10/2017 Elsevier Patient Education  [place].    Anaphylactic Reaction, Adult An anaphylactic reaction (anaphylaxis) is a sudden, severe allergic reaction by the body's disease-fighting system (immune system). Anaphylaxis can be life-threatening. This condition must be treated right away. Sometimes a person may need to be treated in the hospital. What are the causes? This condition is caused by exposure to a substance that you are allergic to (allergen). In response to this exposure, the body releases proteins (antibodies) and other compounds, such as histamine, into the bloodstream. This causes swelling in certain tissues and loss of blood pressure to important areas, such as the heart and lungs. Common allergens that can cause anaphylaxis include:  Foods, especially peanuts, wheat, shellfish, milk, and eggs.  Medicines.  Insect bites or stings.  Blood or parts of blood received for treatment (transfusions).  Chemicals, such as dyes, latex, and contrast material that is used for medical tests. What increases the risk? This condition is more likely to occur in people who:  Have allergies.  Have had anaphylaxis before.  Have a family history of anaphylaxis.  Have certain medical conditions, including asthma and eczema. What are the signs or symptoms? Symptoms of anaphylaxis may include:  Feeling warm in the face (flushed). This may include redness.  Itchy, red, swollen areas of skin (hives).  Swelling of the eyes, lips, face, mouth,  tongue, or throat.  Difficulty breathing, speaking, or swallowing.  Noisy breathing (wheezing).  Dizziness or light-headedness.  Fainting.  Pain or cramping in the abdomen.  Vomiting.  Diarrhea. How is this diagnosed? This condition is diagnosed based on:  Your symptoms.  A physical exam.  Blood tests.  Recent history of exposure to allergens. How is this treated? If you think you are having an anaphylactic reaction, you should do the following right away:  Give yourself an epinephrine injection using what is commonly called an auto-injector "pen" (pre-filled automatic epinephrine injection device). Your health care provider will teach you how to use an auto-injector pen.  Call for emergency help. If you use a pen, you must still get emergency medical treatment in the hospital. Treatment in the hospital may include: ? Medicines to help:  Tighten your blood vessels (epinephrine).  Relieve itching and hives (antihistamines).  Reduce swelling (corticosteroids). ? Oxygen therapy to help you breathe. ? IV fluids to keep you hydrated. Follow these instructions at home: Safety  Always keep an auto-injector pen near you. This can be lifesaving if you have a severe anaphylactic reaction. Use your auto-injector pen as  told by your health care provider.  Do not drive after an anaphylactic reaction until your health care provider approves.  Make sure that you, the members of your household, and your employer know: ? What you are allergic to, so it can be avoided. ? How to use an auto-injector pen to give you an epinephrine injection.  Replace your epinephrine immediately after you use your auto-injector pen. This is important if you have another reaction.  If told by your health care provider, wear a medical alert bracelet or necklace that states your allergy.  Learn the signs of anaphylaxis so that you can recognize and treat it right away.  Work with your health care  providers to make an anaphylaxis plan. Preparation is important. General instructions  If you have hives or rash: ? Use an over-the-counter antihistamine as told by your health care provider. ? Apply cold, wet cloths (cold compresses) to your skin or take baths or showers in cool water. Avoid hot water.  Take over-the-counter and prescription medicines only as told by your health care provider.  Tell all your health care providers that you have an allergy.  Keep all follow-up visits as told by your health care provider. This is important. How is this prevented?  Avoid allergens that have caused an anaphylactic reaction in the past.  When you are at a restaurant, tell your server that you have an allergy. If you are not sure whether a menu item contains an ingredient that you are allergic to, ask your server. Where to find more information  American Academy of Allergy, Asthma and Immunology: aaaai.org  American Academy of Pediatrics: healthychildren.org Get help right away if:  You develop symptoms of an allergic reaction. You may notice them soon after you are exposed to a substance. Symptoms may include: ? Flushed skin. ? Hives. ? Swelling of the eyes, lips, face, mouth, tongue, or throat. ? Difficulty breathing, speaking, or swallowing. ? Wheezing. ? Dizziness or light-headedness. ? Fainting. ? Pain or cramping in the abdomen. ? Vomiting. ? Diarrhea.  You used epinephrine. You need more medical care even if the medicine seems to be working. This is important because anaphylaxis may happen again within 72 hours (rebound anaphylaxis). You may need more doses of epinephrine. These symptoms may represent a serious problem that is an emergency. Do not wait to see if the symptoms will go away. Do the following right away:  Use the auto-injector pen as you have been instructed.  Get medical help. Call your local emergency services (911 in the [place] Do not drive yourself to the  hospital. Summary  An anaphylactic reaction (anaphylaxis) is a sudden, severe allergic reaction by the body's disease-fighting system (immune system).  This condition can be life-threatening. If you have an anaphylactic reaction, get medical help right away.  Your health care provider may teach you how to use an auto-injector "pen" (pre-filled automatic epinephrine injection device) to give yourself a shot.  Always keep an auto-injector pen with you. This could save your life. Use it as told by your health care provider.  If you use epinephrine, you must still get emergency medical treatment, even if the medicine seems to be working. This information is not intended to replace advice given to you by your health care provider. Make sure you discuss any questions you have with your health care provider. Document Revised: 07/19/2017 Document Reviewed: 07/19/2017 Elsevier Patient Education  [place].   How to Use an Auto-Injector Pen An auto-injector  pen (pre-filled automatic epinephrine injection device) is a device that is used to deliver epinephrine to the body. Epinephrine is a medicine that is given as a shot (injection). It works by relaxing the muscles in the airways and tightening the blood vessels. It is used to treat:  A life-threatening allergic reaction (anaphylaxis).  Serious breathing problems, such as severe asthma attacks, some lung problems, and other emergency conditions. An epinephrine injection can save your life. You should always carry an auto-injector pen with you if you are at risk for severe asthma attacks or anaphylaxis. You may hear other names for an auto-injector pen. They are epinephrine injection, epinephrine auto-injector pen, epinephrine pen, and automatic injection device. What are the risks? Using the auto-injector pen is safe. However, problems may arise, including:  Damage to bone or tissue. Make sure that you correctly place the needle in the  muscle of your outer thigh as told by your health care provider. When should I use my auto-injector pen? Use your auto-injector pen as soon as you think you are experiencing anaphylaxis or a severe asthma attack. Anaphylaxis is very dangerous if it is not treated right away. Signs and symptoms of anaphylaxis may include:  Feeling warm in the face (flushed). This may include redness.  Itchy, red, swollen areas of skin (hives).  Swelling of the eyes, lips, face, mouth, tongue, or throat.  Difficulty breathing, speaking, or swallowing.  Noisy breathing (wheezing).  Dizziness or light-headedness.  Fainting.  Pain or cramping in the abdomen.  Vomiting.  Diarrhea. These symptoms may represent a serious problem that is an emergency. Do not wait to see if the symptoms will go away. Use your auto-injector pen as you have been instructed, and get medical help right away. Call your local emergency services (911 in the [place] Do not drive yourself to the hospital. General tips for using an auto-injector pen   Use epinephrine exactly as told by your health care provider. Do not inject it more often or in greater or smaller doses than your health care provider told you. Most auto-injector pens contain one dose of epinephrine. Some contain two doses.  Use the auto-injector pen to give yourself an injection under your skin or into your muscle on the outer side of your thigh. Do not give yourself an injection into your buttocks or any other part of your body. ? In an emergency, you can use your auto-injector pen through your clothing. ? After you inject a dose of epinephrine, some liquid may remain in your auto-injector pen. This is normal.  If you need to give yourself a second dose of epinephrine, give the second injection in another area on your outer thigh. Do not give two injections in exactly the same place on your body. This can lead to tissue damage.  From time to time: ? Check the  expiration date on your auto-injector pen. ? Check the solution to ensure that it is not cloudy and that there are no particles floating in it. If your auto-injector pen is expired or if the solution is cloudy or has particles floating in it, throw it away and get a new one.  Ask your health care provider how to safely get rid of used or expired auto-injector pens.  Talk with your pharmacist or health care provider if you have questions about how to inject epinephrine correctly. Get help right away if:  You inject epinephrine. You may need additional medical care, and you may need to be monitored for  the side effects of epinephrine. The side effects include: ? Difficulty breathing. ? Fast or irregular heartbeat. ? Nausea or vomiting. ? Sweating. ? Dizziness. ? Nervousness or anxiety. ? Weakness. ? Pale skin. ? Headache. ? Shaking that does not stop. Summary  An auto-injector pen (pre-filled automatic epinephrine injection device) is a device that is used to deliver epinephrine to the body.  An auto-injector pen is used to treat a life-threatening allergic reaction (anaphylaxis), asthma attack, or other emergency conditions.  You should always carry an auto-injector pen with you if you are at risk for anaphylaxis or severe asthma attacks.  Use of this device is safe. However, bone or tissue damage can occur if you do not follow instructions for injecting the medicine.  Talk with your pharmacist or health care provider if you have questions about how to inject epinephrine correctly. This information is not intended to replace advice given to you by your health care provider. Make sure you discuss any questions you have with your health care provider. Document Revised: 05/08/2018 Document Reviewed: 05/08/2018 Elsevier Patient Education  [place].

## 2019-05-12 NOTE — Progress Notes (Signed)
Patient ID: Anita Weaver, female    DOB: [birth year], 44 y.o.   MRN: [id number]  PCP: [name], PA-C  Chief Complaint  Patient presents with  . Follow-up    labs  . Allergic Reaction    hives, facial swelling and eyelids    Subjective:   Anita Weaver is a 44 y.o. female, presents to clinic with CC of the following:  HPI    Patient presents with concerns of hives on and off for the past couple weeks also had some lip swelling that was for few hours and resolved the next day, had some swelling around her right eye that was similar lasted for a few days and then resolved.  She is concerned she is having allergic reaction but she does not know what to do.  About 10 days ago she started new migraine medication, she does believe that some of these symptoms and hives were prior to the onset of that medication.  She did a dose increase last Thursday and then had the lip swelling on Friday.  She suspects that she may be allergic to ibuprofen that she was taking.  She cannot think of any other new foods, medications, soaps detergents.  She has no history of hives in the past.  Did have an they were wheals that were raised in skin folds to her bilateral groins and under her breasts, she has taken Benadryl few times and this does seem to help her symptoms. She has not had any associated stridor, wheeze, shortness of breath, difficulty swallowing or speaking, associated nausea vomiting severe headaches abdominal pain.  She has not had anaphylactic reaction to anything in the past.   Chest 1 to follow-up on some abnormal labs she states that her OB/GYN did diagnose her with vitamin D deficiency and prescribed her prescription strength supplement which she finished about a month ago.  She has complained of severe fatigue and while taking vitamin D she did feel much better.  She has not started a daily supplement   Patient also complains of muscle cramps spasms charley horses and some shooting  pain when she does certain movements with her hands or her feet and she would like to be checked for other deficiencies that may be causing this.  She also has history of hyperlipidemia we reviewed her last labs including reviewing all lab results, and all factors that go into 10-year ASCVD risk calculation Lab Results  Component Value Date   CHOL 212 (H) 01/23/2019   HDL 40 (L) 01/23/2019   LDLCALC 151 (H) 01/23/2019   TRIG 98 01/23/2019   CHOLHDL 5.3 (H) 01/23/2019      The 10-year ASCVD risk score Mikey Bussing DC Jr., et al., 2013) is: 1.2%   Values used to calculate the score:     Age: 22 years     Sex: Female     Is Non-Hispanic African American: No     Diabetic: No     Tobacco smoker: No     Systolic Blood Pressure: 123XX123 mmHg     Is BP treated: No     HDL Cholesterol: 40 mg/dL     Total Cholesterol: 212 mg/dL   A a lot of cramps in feet and hands  She has vit D deficiency, want to check on labs again, stopped prescriptions supplement about a month ago from OBGYN    Patient Active Problem List   Diagnosis Date Noted  . Arthritis 07/09/2018  . Chronic interstitial  cystitis 07/09/2018  . Irritable bowel syndrome 07/09/2018  . Scoliosis deformity of spine 07/09/2018  . Asthma 04/27/2017  . Acid reflux 04/27/2017  . Hiatal hernia with GERD 04/27/2017  . Contact dermatitis 05/15/2016  . Fatigue 01/20/2016  . Mild dysplasia of cervix (CIN I) 09/29/2014  . Pap smear abnormality of cervix with LGSIL 09/21/2014  . FH: genetic disease carrier 04/22/2014  . Migraine headache 05/08/2013  . Bipolar disorder, unspecified ([place]) 05/08/2013  . Endometriosis 04/18/2012  . Chronic pelvic pain in female 04/18/2012  . Female genital symptoms 04/18/2012      Current Outpatient Medications:  .  albuterol (PROAIR HFA) 108 (90 Base) MCG/ACT inhaler, Inhale 2 puffs into the lungs every 4 (four) hours as needed for wheezing or shortness of breath., Disp: 18 g, Rfl: 2 .  BREO ELLIPTA  100-25 MCG/INH AEPB, TAKE 1 PUFF BY MOUTH EVERY DAY, Disp: 60 each, Rfl: 0 .  lurasidone (LATUDA) 40 MG TABS tablet, Take 1 tablet (40 mg total) by mouth daily with breakfast., Disp: 90 tablet, Rfl: 1 .  medroxyPROGESTERone (DEPO-PROVERA) 150 MG/ML injection, Inject 1 mL (150 mg total) into the muscle every 3 (three) months., Disp: 1 mL, Rfl: 3 .  ondansetron (ZOFRAN) 4 MG tablet, Take one tablet every 8 hours as needed. #10 to last 30 days. For migraines., Disp: 10 tablet, Rfl: 5 .  pantoprazole (PROTONIX) 40 MG tablet, Take 1 tablet (40 mg total) by mouth 2 (two) times daily., Disp: 30 tablet, Rfl: 3 .  tiZANidine (ZANAFLEX) 4 MG tablet, Take 1 tablet (4 mg total) by mouth every 6 (six) hours as needed for muscle spasms., Disp: 30 tablet, Rfl: 3   Allergies  Allergen Reactions  . Imitrex [Sumatriptan] Other (See Comments)    Heart palpitations, hot/cold sweats  . Demerol [Meperidine] Other (See Comments)    hallucinations  . Flagyl [Metronidazole] Hives     Family History  Problem Relation Age of Onset  . Cancer Mother 40       breast  . Breast cancer Mother 71  . Cancer Sister 30  . Breast cancer Sister 33  . Kidney disease Son   . Heart attack Maternal Grandfather   . Heart attack Paternal Grandmother   . Heart attack Paternal Grandfather      Social History   Socioeconomic History  . Marital status: Married    Spouse name: Anita Weaver  . Number of children: 3  . Years of education: 73  . 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 Smoking status: Never Smoker  . Smokeless tobacco: Never Used  Substance and Sexual Activity  . Alcohol use: Yes    Alcohol/week: 0.0 standard drinks    Comment: occasionally  . Drug use: No  . Sexual activity: Yes    Partners: Male    Birth control/protection: Surgical    Comment: tubaligation  Other Topics Concern  . Not on file  Social History Narrative   ** Merged History  Encounter **       ** Data from: 02/09/14 Enc Dept: [facility]       ** Data from: 11/10/13 Enc Dept: [facility]   Patient lives at home with her husband Anita Weaver)   Patient works full time.   Right handed.   Caffeine- none   Education- high school   Social Determinants of Health   Financial Resource Strain:   . Difficulty of Paying  Living Expenses: Not on file  Food Insecurity:   . Worried About Charity fundraiser in the Last Year: Not on file  . Ran Out of Food in the Last Year: Not on file  Transportation Needs:   . Lack of Transportation (Medical): Not on file  . Lack of Transportation (Non-Medical): Not on file  Physical Activity:   . Days of Exercise per Week: Not on file  . Minutes of Exercise per Session: Not on file  Stress:   . Feeling of Stress : Not on file  Social Connections:   . Frequency of Communication with Friends and Family: Not on file  . Frequency of Social Gatherings with Friends and Family: Not on file  . Attends Religious Services: Not on file  . Active Member of Clubs or Organizations: Not on file  . Attends Archivist Meetings: Not on file  . Marital Status: Not on file  Intimate Partner Violence:   . Fear of Current or Ex-Partner: Not on file  . Emotionally Abused: Not on file  . Physically Abused: Not on file  . Sexually Abused: Not on file    Chart Review Today: I personally reviewed active problem list, medication list, allergies, family history, social history, health maintenance, notes from last encounter, lab results, imaging with the patient/caregiver today.   Review of Systems  Constitutional: Negative.   HENT: Negative.   Eyes: Negative.   Respiratory: Negative.   Cardiovascular: Negative.   Gastrointestinal: Negative.   Endocrine: Negative.   Genitourinary: Negative.   Musculoskeletal: Negative.   Skin: Negative.   Allergic/Immunologic: Negative.   Neurological: Negative.   Hematological: Negative.     Psychiatric/Behavioral: Negative.   All other systems reviewed and are negative.      Objective:   Vitals:   05/12/19 1527  BP: 122/84  Pulse: 99  Resp: 14  Temp: 98.9 F (37.2 C)  SpO2: 97%  Weight: 210 lb (95.3 kg)  Height: 5\' 8"  (1.727 m)    Body mass index is 31.93 kg/m.  Physical Exam Vitals and nursing note reviewed.  Constitutional:      General: She is not in acute distress.    Appearance: Normal appearance. She is well-developed. She is not ill-appearing, toxic-appearing or diaphoretic.  HENT:     Head: Normocephalic and atraumatic. No right periorbital erythema or left periorbital erythema.     Right Ear: External ear normal.     Left Ear: External ear normal.     Nose: Nose normal.     Mouth/Throat:     Mouth: Mucous membranes are moist.     Pharynx: Oropharynx is clear. No oropharyngeal exudate or posterior oropharyngeal erythema.     Comments: No lip or intraoral edema Eyes:     General:        Right eye: No discharge.        Left eye: No discharge.     Conjunctiva/sclera: Conjunctivae normal.     Pupils: Pupils are equal, round, and reactive to light.  Neck:     Trachea: Trachea and phonation normal. No tracheal deviation.  Cardiovascular:     Rate and Rhythm: Normal rate and regular rhythm.     Pulses: Normal pulses.     Heart sounds: Normal heart sounds. No murmur. No friction rub. No gallop.   Pulmonary:     Effort: Pulmonary effort is normal. No respiratory distress.     Breath sounds: Normal breath sounds. No stridor. No wheezing or rhonchi.  Musculoskeletal:        General: Normal range of motion.     Cervical back: Normal range of motion and neck supple.  Skin:    General: Skin is warm and dry.     Findings: No rash.  Neurological:     Mental Status: She is alert.     Motor: No abnormal muscle tone.     Coordination: Coordination normal.  Psychiatric:        Behavior: Behavior normal.      Results for orders placed or performed  in visit on 04/25/19  Novel Coronavirus, NAA ([facility])   Specimen: Nasopharyngeal(NP) swabs in vial transport medium   NASOPHARYNGE  IS THIS  Result Value Ref Range   SARS-CoV-2, NAA Not Detected Not Detected        Assessment & Plan:      ICD-10-CM   1. Hives  L50.9 hydrOXYzine (ATARAX/VISTARIL) 25 MG tablet   unclear what her reaction may be to, possibly her new med, but she would like to continue to take it, she suspects NSAIDs causing  2. Allergic reaction, initial encounter  T78.40XA EPINEPHrine (EPIPEN 2-PAK) 0.3 mg/0.3 mL IJ SOAJ injection    hydrOXYzine (ATARAX/VISTARIL) 25 MG tablet   unclear etiology - tx discussed as noted below, reviewed indications for epi pen use and appropriate f/up care and monitoring   3. Vitamin D deficiency  E55.9 VITAMIN D 25 Hydroxy (Vit-D Deficiency, Fractures)   recheck labs and see if she needs Rx again or OTC Meds  4. Muscle cramp  [id number] COMPLETE METABOLIC PANEL WITH GFR    Magnesium    CBC with Differential/Platelet    TSH   check cbc screening for iron deficiency - if abnormal can add on iron panel, or other labs/b12 etc, electrolytes, mag tsh  5. Other general symptoms and signs  [id number] COMPLETE METABOLIC PANEL WITH GFR    Magnesium    VITAMIN D 25 Hydroxy (Vit-D Deficiency, Fractures)    CBC with Differential/Platelet    TSH  6. Fatigue, unspecified type  [id number] COMPLETE METABOLIC PANEL WITH GFR    Magnesium    VITAMIN D 25 Hydroxy (Vit-D Deficiency, Fractures)    CBC with Differential/Platelet    TSH      For rash or hives - take 50 mg benadryl (or hydroxyzine) and 40 mg pepcid by mouth  Call 911 or can use epi pen if any progressing rash with swelling to face/mouth/lips with difficulty breathing or stridor, N/V, severe HA, abd pain stridor/wheeze.  If any of her sx return and meds to not help control, pt was instructed to contact us so we may but in referral to allergy specialist   [name], PA-C 05/12/19 3:45 PM

## 2019-05-13 LAB — COMPLETE METABOLIC PANEL WITH GFR
AG Ratio: 1.6 (calc) (ref 1.0–2.5)
ALT: 18 U/L (ref 6–29)
AST: 13 U/L (ref 10–30)
Albumin: 4.5 g/dL (ref 3.6–5.1)
Alkaline phosphatase (APISO): 47 U/L (ref 31–125)
BUN: 13 mg/dL (ref 7–25)
CO2: 23 mmol/L (ref 20–32)
Calcium: 9.5 mg/dL (ref 8.6–10.2)
Chloride: 103 mmol/L (ref 98–110)
Creat: 1.07 mg/dL (ref 0.50–1.10)
GFR, Est African American: 74 mL/min/{1.73_m2} (ref >=60)
GFR, Est Non African American: 64 mL/min/{1.73_m2} (ref >=60)
Globulin: 2.8 g/dL (calc) (ref 1.9–3.7)
Glucose, Bld: 88 mg/dL (ref 65–99)
Potassium: 3.7 mmol/L (ref 3.5–5.3)
Sodium: 138 mmol/L (ref 135–146)
Total Bilirubin: 0.5 mg/dL (ref 0.2–1.2)
Total Protein: 7.3 g/dL (ref 6.1–8.1)

## 2019-05-13 LAB — CBC WITH DIFFERENTIAL/PLATELET
Absolute Monocytes: 700 cells/uL (ref 200–950)
Basophils Absolute: 52 cells/uL (ref 0–200)
Basophils Relative: 0.5 %
Eosinophils Absolute: 237 cells/uL (ref 15–500)
Eosinophils Relative: 2.3 %
HCT: 46.3 % — ABNORMAL HIGH (ref 35.0–45.0)
Hemoglobin: 16.1 g/dL — ABNORMAL HIGH (ref 11.7–15.5)
Lymphs Abs: 3492 cells/uL (ref 850–3900)
MCH: 30.9 pg (ref 27.0–33.0)
MCHC: 34.8 g/dL (ref 32.0–36.0)
MCV: 88.9 fL (ref 80.0–100.0)
MPV: 8.5 fL (ref 7.5–12.5)
Monocytes Relative: 6.8 %
Neutro Abs: 5820 cells/uL (ref 1500–7800)
Neutrophils Relative %: 56.5 %
Platelets: 330 10*3/uL (ref 140–400)
RBC: 5.21 10*6/uL — ABNORMAL HIGH (ref 3.80–5.10)
RDW: 12.5 % (ref 11.0–15.0)
Total Lymphocyte: 33.9 %
WBC: 10.3 10*3/uL (ref 3.8–10.8)

## 2019-05-13 LAB — VITAMIN D 25 HYDROXY (VIT D DEFICIENCY, FRACTURES): Vit D, 25-Hydroxy: 43 ng/mL (ref 30–100)

## 2019-05-13 LAB — TSH: TSH: 1.83 mIU/L

## 2019-05-13 LAB — MAGNESIUM: Magnesium: 1.8 mg/dL (ref 1.5–2.5)

## 2019-05-14 ENCOUNTER — Telehealth: Payer: Self-pay

## 2019-05-14 DIAGNOSIS — D582 Other hemoglobinopathies: Secondary | ICD-10-CM

## 2019-05-14 DIAGNOSIS — R718 Other abnormality of red blood cells: Secondary | ICD-10-CM

## 2019-05-14 NOTE — Telephone Encounter (Signed)
Copied from [place] [id number]. Topic: General - Inquiry >> May 14, 2019  1:51 PM Mathis Bud wrote: Reason for CRM: Patient is requesting a call back from PCP nurse regarding labs she had done on 2/1. Call back [phone]

## 2019-05-14 NOTE — Telephone Encounter (Signed)
Pt called back and is concerned about elevated HCT, HGB.  Wants to know if it could be cardiac related?  And when do you want her to recheck?

## 2019-05-15 NOTE — Telephone Encounter (Signed)
Left detailed vm °

## 2019-05-19 NOTE — Telephone Encounter (Signed)
Copied from [place] [id number]. Topic: General - Other >> May 19, 2019  3:15 PM Leward Quan A wrote: Reason for CRM: Patient called to inform Dr Lucio Edward and her nurse that she is having sleeping problems at night and would like a referral. Would like referral to [facility] asking for a call back at Ph# [phone]

## 2019-05-20 LAB — CBC WITH DIFFERENTIAL/PLATELET
Absolute Monocytes: 476 cells/uL (ref 200–950)
Basophils Absolute: 49 cells/uL (ref 0–200)
Basophils Relative: 0.6 %
Eosinophils Absolute: 238 cells/uL (ref 15–500)
Eosinophils Relative: 2.9 %
HCT: 44.1 % (ref 35.0–45.0)
Hemoglobin: 15.2 g/dL (ref 11.7–15.5)
Lymphs Abs: 3501 cells/uL (ref 850–3900)
MCH: 30.6 pg (ref 27.0–33.0)
MCHC: 34.5 g/dL (ref 32.0–36.0)
MCV: 88.9 fL (ref 80.0–100.0)
MPV: 8.9 fL (ref 7.5–12.5)
Monocytes Relative: 5.8 %
Neutro Abs: 3936 cells/uL (ref 1500–7800)
Neutrophils Relative %: 48 %
Platelets: 351 10*3/uL (ref 140–400)
RBC: 4.96 10*6/uL (ref 3.80–5.10)
RDW: 12.4 % (ref 11.0–15.0)
Total Lymphocyte: 42.7 %
WBC: 8.2 10*3/uL (ref 3.8–10.8)

## 2019-05-22 NOTE — Progress Notes (Signed)
Primary Care Physician: [name], PA-C  Primary Gastroenterologist:  Dr. Lucilla Lame  Chief Complaint  Patient presents with  . Follow up GERD    HPI: Anita Weaver is a 44 y.o. female here for GERD that is not responding to [facility].  The patient had called her primary care provider's office for a referral to see me because of a report that her Dexilant was not working and she was having acid breakthrough.  The patient was told to come in and discuss her symptoms to see if her symptoms were truly acid reflux or something else causing her discomfort.  The patient called back to her PCP reporting that she was in so much discomfort that she was thinking of going to the emergency room.  The patient was started on pantoprazole at that time and is now here to follow-up with me. She now reports that the pantoprazole has been helping her symptoms immensely.  She had only one episode of acid breakthrough and reports that to have been at the beginning when she switch.  There is no report of any dysphagia nausea vomiting fevers chills black stools or bloody stools.  The patient was started on pantoprazole 40 mg twice a day when she called our office.  Past Medical History:  Diagnosis Date  . Allergy   . Arthritis   . Asthma   . Chronic pelvic pain in female   . Endometriosis   . FH: breast cancer in first degree relative    Mother and sister both are BRCA positive; patient is BRCA negative  . GERD (gastroesophageal reflux disease)   . Headache    migraines  . Heart murmur   . Hyperlipidemia   . Hypertension   . Interstitial cystitis   . Irritable bowel disease   . Neck pain     Current Outpatient Medications  Medication Sig Dispense Refill  . albuterol (PROAIR HFA) 108 (90 Base) MCG/ACT inhaler Inhale 2 puffs into the lungs every 4 (four) hours as needed for wheezing or shortness of breath. 18 g 2  . BREO ELLIPTA 100-25 MCG/INH AEPB TAKE 1 PUFF BY MOUTH EVERY DAY 60 each 0  .  EPINEPHrine (EPIPEN 2-PAK) 0.3 mg/0.3 mL IJ SOAJ injection Inject 0.3 mLs (0.3 mg total) into the muscle once as needed (for severe allergic reaction). CAll 911 immediately if you have to use this medicine 2 each 1  . hydrOXYzine (ATARAX/VISTARIL) 25 MG tablet Take 1-2 tablets (25-50 mg total) by mouth every 8 (eight) hours as needed (hives). 60 tablet 1  . lurasidone (LATUDA) 40 MG TABS tablet Take 1 tablet (40 mg total) by mouth daily with breakfast. 90 tablet 1  . medroxyPROGESTERone (DEPO-PROVERA) 150 MG/ML injection Inject 1 mL (150 mg total) into the muscle every 3 (three) months. 1 mL 3  . ondansetron (ZOFRAN) 4 MG tablet Take one tablet every 8 hours as needed. #10 to last 30 days. For migraines. 10 tablet 5  . pantoprazole (PROTONIX) 40 MG tablet Take 1 tablet (40 mg total) by mouth 2 (two) times daily. 30 tablet 3  . tiZANidine (ZANAFLEX) 4 MG tablet Take 1 tablet (4 mg total) by mouth every 6 (six) hours as needed for muscle spasms. 30 tablet 3   No current facility-administered medications for this visit.    Allergies as of 05/23/2019 - Review Complete 05/23/2019  Allergen Reaction Noted  . Imitrex [sumatriptan] Other (See Comments) 04/24/2014  . Demerol [meperidine] Other (See Comments) 04/18/2012  .  Flagyl [metronidazole] Hives 04/15/2015    ROS:  General: Negative for anorexia, weight loss, fever, chills, fatigue, weakness. ENT: Negative for hoarseness, difficulty swallowing , nasal congestion. CV: Negative for chest pain, angina, palpitations, dyspnea on exertion, peripheral edema.  Respiratory: Negative for dyspnea at rest, dyspnea on exertion, cough, sputum, wheezing.  GI: See history of present illness. GU:  Negative for dysuria, hematuria, urinary incontinence, urinary frequency, nocturnal urination.  Endo: Negative for unusual weight change.    Physical Examination:   BP 124/85   Pulse 87   Temp 98.8 F (37.1 C) (Oral)   Ht 5' 8"  (1.727 m)   Wt 208 lb 9.6 oz  (94.6 kg)   BMI 31.72 kg/m   General: Well-nourished, well-developed in no acute distress.  Eyes: No icterus. Conjunctivae pink. Lungs: Clear to auscultation bilaterally. Non-labored. Heart: Regular rate and rhythm, no murmurs rubs or gallops.  Abdomen: Bowel sounds are normal, nontender, nondistended, no hepatosplenomegaly or masses, no abdominal bruits or hernia , no rebound or guarding.   Extremities: No lower extremity edema. No clubbing or deformities. Neuro: Alert and oriented x 3.  Grossly intact. Skin: Warm and dry, no jaundice.   Psych: Alert and cooperative, normal mood and affect.  Labs:    Imaging Studies: No results found.  Assessment and Plan:   Anita Weaver is a 44 y.o. y/o female who comes in today with a history of acid breakthrough on Dexilant.  The patient was switched to pantoprazole 40 mg twice a day and states that she has no further symptoms of heartburn or reflux.  The patient states she has been stable on this medication.  The patient was told to decrease her pantoprazole to 40 mg once a day and since her symptoms are more pronounced at night she will take it in the evening.  She will see if this once a day dosing helps her symptoms and if the do not then she will go back on the twice a day.  The patient will be given a prescription for the medication since she only has her present refill at this time.  The patient has been explained the plan and agrees with it.     Lucilla Lame, MD. [name]    Note: This dictation was prepared with Dragon dictation along with smaller phrase technology. Any transcriptional errors that result from this process are unintentional.

## 2019-05-23 ENCOUNTER — Encounter: Payer: Self-pay | Admitting: Gastroenterology

## 2019-05-23 ENCOUNTER — Other Ambulatory Visit: Payer: Self-pay

## 2019-05-23 ENCOUNTER — Ambulatory Visit: Payer: 59 | Admitting: Gastroenterology

## 2019-05-23 VITALS — BP 124/85 | HR 87 | Temp 98.8°F | Ht 68.0 in | Wt 208.6 lb

## 2019-05-23 DIAGNOSIS — K219 Gastro-esophageal reflux disease without esophagitis: Secondary | ICD-10-CM

## 2019-05-23 MED ORDER — PANTOPRAZOLE SODIUM 40 MG PO TBEC: 40.0000 mg | DELAYED_RELEASE_TABLET | Freq: Two times a day (BID) | ORAL | 11 refills | Status: DC

## 2019-06-10 ENCOUNTER — Institutional Professional Consult (permissible substitution): Payer: Self-pay | Admitting: Neurology

## 2019-06-16 ENCOUNTER — Ambulatory Visit: Payer: 59 | Admitting: Neurology

## 2019-06-16 ENCOUNTER — Encounter: Payer: Self-pay | Admitting: Neurology

## 2019-06-16 ENCOUNTER — Other Ambulatory Visit: Payer: Self-pay

## 2019-06-16 VITALS — BP 135/93 | HR 97 | Temp 97.2°F | Ht 68.0 in | Wt 210.3 lb

## 2019-06-16 DIAGNOSIS — R0689 Other abnormalities of breathing: Secondary | ICD-10-CM

## 2019-06-16 DIAGNOSIS — R0602 Shortness of breath: Secondary | ICD-10-CM

## 2019-06-16 DIAGNOSIS — G4719 Other hypersomnia: Secondary | ICD-10-CM

## 2019-06-16 DIAGNOSIS — R0683 Snoring: Secondary | ICD-10-CM | POA: Diagnosis not present

## 2019-06-16 DIAGNOSIS — R519 Headache, unspecified: Secondary | ICD-10-CM

## 2019-06-16 DIAGNOSIS — G478 Other sleep disorders: Secondary | ICD-10-CM

## 2019-06-16 DIAGNOSIS — R351 Nocturia: Secondary | ICD-10-CM

## 2019-06-16 DIAGNOSIS — E669 Obesity, unspecified: Secondary | ICD-10-CM

## 2019-06-16 NOTE — Patient Instructions (Signed)
    Based on your symptoms and your exam I believe you are at risk for obstructive sleep apnea (aka OSA), and I think we should proceed with a sleep study to determine whether you do or do not have OSA and how severe it is. Even, if you have mild OSA, I may want you to consider treatment with CPAP, as treatment of even borderline or mild sleep apnea can result and improvement of symptoms such as sleep disruption, daytime sleepiness, nighttime bathroom breaks, restless leg symptoms, improvement of headache syndromes, even improved mood disorder.   Please remember, the long-term risks and ramifications of untreated moderate to severe obstructive sleep apnea are: increased Cardiovascular disease, including congestive heart failure, stroke, difficult to control hypertension, treatment resistant obesity, arrhythmias, especially irregular heartbeat commonly known as A. Fib. (atrial fibrillation); even type 2 diabetes has been linked to untreated OSA.   Sleep apnea can cause disruption of sleep and sleep deprivation in most cases, which, in turn, can cause recurrent headaches, problems with memory, mood, concentration, focus, and vigilance. Most people with untreated sleep apnea report excessive daytime sleepiness, which can affect their ability to drive. Please do not drive if you feel sleepy. Patients with sleep apnea developed difficulty initiating and maintaining sleep (aka insomnia).   Having sleep apnea may increase your risk for other sleep disorders, including involuntary behaviors sleep such as sleep terrors, sleep talking, sleepwalking.    Having sleep apnea can also increase your risk for restless leg syndrome and leg movements at night.   Please note that untreated obstructive sleep apnea may carry additional perioperative morbidity. Patients with significant obstructive sleep apnea (typically, in the moderate to severe degree) should receive, if possible, perioperative PAP (positive airway pressure)  therapy and the surgeons and particularly the anesthesiologists should be informed of the diagnosis and the severity of the sleep disordered breathing.   I will likely see you back after your sleep study to go over the test results and where to go from there. We will call you after your sleep study to advise about the results (most likely, you will hear from Kristen, my nurse) and to set up an appointment at the time, as necessary.    Our sleep lab administrative assistant will call you to schedule your sleep study and give you further instructions, regarding the check in process for the sleep study, arrival time, what to bring, when you can expect to leave after the study, etc., and to answer any other logistical questions you may have. If you don't hear back from her by about 2 weeks from now, please feel free to call her direct line at [phone] or you can call our general clinic number, or email us through My Chart.   

## 2019-06-16 NOTE — Progress Notes (Signed)
Subjective:    Patient ID: Anita Weaver is a 44 y.o. female.  HPI     Star Age, MD, PhD [facility] [address], [address] [place], [place] [address]  Dear Kristeen Miss,   I saw your patient, Anita Weaver, upon your kind request in my sleep clinic today for initial consultation of her sleep disorder, in particular, her difficulty breathing at night. The patient is unaccompanied today.  As you know, Anita Weaver is a 44 year old right-handed woman with an underlying medical history of migraine headaches, reflux disease, asthma, arthritis, allergies, irritable bowel syndrome, hypertension, hyperlipidemia, history of heart murmur, neck pain, interstitial cystitis, and obesity, who reports snoring and shortness of breath at night, particularly when she lays on her back.  She feels that the symptoms started recently, in January.  She has woken up with a sense of gasping for air.  She has nocturia twice per average night and has rarely woken up with a headache.  She has seen Dr. Krista Blue for her headaches. I reviewed your office note from 05/12/2019.  Her Epworth sleepiness score is 6 out of 24, fatigue severity score is 53 out of 63.  She has a family history of sleep apnea affecting 2 maternal uncles.  One uncle passed away from heart complications.  She lives with her husband, she has 3 grown children, they have no pets in the house.  She works as a Print production planner.  She tries to be in bed around 730 and rise time is around 5:15 AM.  She has a history of asthma and is in the process of seeing a lung specialist as well as I understand.  She is a non-smoker and does not drink caffeine and drinks alcohol rarely, maybe twice a year.  She has gained weight in the past 2 months or so in the realm of 10 pounds.  She has a TV in the bedroom but it is off at night.  She does not wake up rested and is tired during the day, takes naps on the weekends.  Her Past Medical History Is  Significant For: Past Medical History:  Diagnosis Date  . Allergy   . Arthritis   . Asthma   . Chronic pelvic pain in female   . Endometriosis   . FH: breast cancer in first degree relative    Mother and sister both are BRCA positive; patient is BRCA negative  . GERD (gastroesophageal reflux disease)   . Headache    migraines  . Heart murmur   . Hyperlipidemia   . Hypertension   . Interstitial cystitis   . Irritable bowel disease   . Neck pain     Her Past Surgical History Is Significant For: Past Surgical History:  Procedure Laterality Date  . BREAST BIOPSY Left 02/08/2017  . CHOLECYSTECTOMY    . LAPAROSCOPY  2006  . TUBAL LIGATION      Her Family History Is Significant For: Family History  Problem Relation Age of Onset  . Cancer Mother 78       breast  . Breast cancer Mother 40  . Cancer Sister 35  . Breast cancer Sister 42  . Kidney disease Son   . Heart attack Maternal Grandfather   . Heart attack Paternal Grandmother   . Heart attack Paternal Grandfather     Her Social History Is Significant For: Social History   Socioeconomic History  . Marital status: Married    Spouse name: Anita Weaver  .  Number of children: 3  . Years of education: 57  . 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 Smoking status: Never Smoker  . Smokeless tobacco: Never Used  Substance and Sexual Activity  . Alcohol use: Yes    Alcohol/week: 0.0 standard drinks    Comment: occasionally  . Drug use: No  . Sexual activity: Yes    Partners: Male    Birth control/protection: Surgical    Comment: tubaligation  Other Topics Concern  . Not on file  Social History Narrative   ** Merged History Encounter **       ** Data from: 02/09/14 Enc Dept: [facility]       ** Data from: 11/10/13 Enc Dept: [facility]   Patient lives at home with her husband Anita Weaver)   Patient works full time.   Right handed.   Caffeine- none    Education- high school   Social Determinants of Health   Financial Resource Strain:   . Difficulty of Paying Living Expenses: Not on file  Food Insecurity:   . Worried About Charity fundraiser in the Last Year: Not on file  . Ran Out of Food in the Last Year: Not on file  Transportation Needs:   . Lack of Transportation (Medical): Not on file  . Lack of Transportation (Non-Medical): Not on file  Physical Activity:   . Days of Exercise per Week: Not on file  . Minutes of Exercise per Session: Not on file  Stress:   . Feeling of Stress : Not on file  Social Connections:   . Frequency of Communication with Friends and Family: Not on file  . Frequency of Social Gatherings with Friends and Family: Not on file  . Attends Religious Services: Not on file  . Active Member of Clubs or Organizations: Not on file  . Attends Archivist Meetings: Not on file  . Marital Status: Not on file    Her Allergies Are:  Allergies  Allergen Reactions  . Imitrex [Sumatriptan] Other (See Comments)    Heart palpitations, hot/cold sweats  . Demerol [Meperidine] Other (See Comments)    hallucinations  . Flagyl [Metronidazole] Hives  :   Her Current Medications Are:  Outpatient Encounter Medications as of 06/16/2019  Medication Sig  . albuterol (PROAIR HFA) 108 (90 Base) MCG/ACT inhaler Inhale 2 puffs into the lungs every 4 (four) hours as needed for wheezing or shortness of breath.  Marland Kitchen BREO ELLIPTA 100-25 MCG/INH AEPB TAKE 1 PUFF BY MOUTH EVERY DAY  . lurasidone (LATUDA) 40 MG TABS tablet Take 1 tablet (40 mg total) by mouth daily with breakfast.  . medroxyPROGESTERone (DEPO-PROVERA) 150 MG/ML injection Inject 1 mL (150 mg total) into the muscle every 3 (three) months.  . ondansetron (ZOFRAN) 4 MG tablet Take one tablet every 8 hours as needed. #10 to last 30 days. For migraines.  . pantoprazole (PROTONIX) 40 MG tablet Take 1 tablet (40 mg total) by mouth 2 (two) times daily.  Marland Kitchen tiZANidine  (ZANAFLEX) 4 MG tablet Take 1 tablet (4 mg total) by mouth every 6 (six) hours as needed for muscle spasms.  Marland Kitchen EPINEPHrine (EPIPEN 2-PAK) 0.3 mg/0.3 mL IJ SOAJ injection Inject 0.3 mLs (0.3 mg total) into the muscle once as needed (for severe allergic reaction). CAll 911 immediately if you have to use this medicine (Patient not taking: Reported on 06/16/2019)  . [DISCONTINUED] hydrOXYzine (ATARAX/VISTARIL)  25 MG tablet Take 1-2 tablets (25-50 mg total) by mouth every 8 (eight) hours as needed (hives). (Patient not taking: Reported on 06/16/2019)   No facility-administered encounter medications on file as of 06/16/2019.  :  Review of Systems:  Out of a complete 14 point review of systems, all are reviewed and negative with the exception of these symptoms as listed below: Review of Systems  Neurological:       Here for sleep consult. No prior sleep study. Pt reports daytime sleepiness and snoring are present. Also struggles with h/a but has a hx of migraines.   Epworth Sleepiness Scale 0= would never doze 1= slight chance of dozing 2= moderate chance of dozing 3= high chance of dozing  Sitting and reading:1 Watching TV:2 Sitting inactive in a public place (ex. Theater or meeting):0 As a passenger in a car for an hour without a break:1 Lying down to rest in the afternoon:2 Sitting and talking to someone:0 Sitting quietly after lunch (no alcohol):0 In a car, while stopped in traffic:0 Total:6     Objective:  Neurological Exam  Physical Exam Physical Examination:   Vitals:   06/16/19 1528  BP: (!) 135/93  Pulse: 97  Temp: (!) 97.2 F (36.2 C)    General Examination: The patient is a very pleasant 44 y.o. female in no acute distress. She appears well-developed and well-nourished and well groomed.   HEENT: Normocephalic, atraumatic, pupils are equal, round and reactive to light, extraocular tracking is good without limitation to gaze excursion or nystagmus noted. Hearing is grossly  intact. Face is symmetric with normal facial animation. Speech is clear with no dysarthria noted. There is no hypophonia. There is no lip, neck/head, jaw or voice tremor. Neck is supple with full range of passive and active motion. There are no carotid bruits on auscultation. Oropharynx exam reveals: mild mouth dryness, adequate dental hygiene and moderate airway crowding, due to Smaller airway entry, Mallampati class III, small tonsils, slightly wider uvula, tongue protrudes centrally and palate elevates symmetrically.  She has a minimal overbite.  Chest: Clear to auscultation without wheezing, rhonchi or crackles noted.  Heart: S1+S2+0, regular and normal without murmurs, rubs or gallops noted.   Abdomen: Soft, non-tender and non-distended with normal bowel sounds appreciated on auscultation.  Extremities: There is no pitting edema in the distal lower extremities bilaterally.   Skin: Warm and dry without trophic changes noted.   Musculoskeletal: exam reveals no obvious joint deformities, tenderness or joint swelling or erythema.   Neurologically:  Mental status: The patient is awake, alert and oriented in all 4 spheres. Her immediate and remote memory, attention, language skills and fund of knowledge are appropriate. There is no evidence of aphasia, agnosia, apraxia or anomia. Speech is clear with normal prosody and enunciation. Thought process is linear. Mood is normal and affect is normal.  Cranial nerves II - XII are as described above under HEENT exam.  Motor exam: Normal bulk, strength and tone is noted. There is no tremor, Romberg is negative. Fine motor skills and coordination: grossly intact.  Cerebellar testing: No dysmetria or intention tremor. There is no truncal or gait ataxia.  Sensory exam: intact to light touch in the upper and lower extremities.  Gait, station and balance: She stands easily. No veering to one side is noted. No leaning to one side is noted. Posture is  age-appropriate and stance is narrow based. Gait shows normal stride length and normal pace. No problems turning are noted. Tandem walk  is Slightly challenging but doable.                Assessment and Plan:  In summary, Anita Weaver is a very pleasant 44 y.o.-year old female with an underlying medical history of migraine headaches, reflux disease, asthma, arthritis, allergies, irritable bowel syndrome, hypertension, hyperlipidemia, history of heart murmur, neck pain, interstitial cystitis, and obesity, whose history and physical exam are concerning for obstructive sleep apnea (OSA). I had a long chat with the patient about my findings and the diagnosis of OSA, its prognosis and treatment options. We talked about medical treatments, surgical interventions and non-pharmacological approaches. I explained in particular the risks and ramifications of untreated moderate to severe OSA, especially with respect to developing cardiovascular disease down the Road, including congestive heart failure, difficult to treat hypertension, cardiac arrhythmias, or stroke. Even type 2 diabetes has, in part, been linked to untreated OSA. Symptoms of untreated OSA include daytime sleepiness, memory problems, mood irritability and mood disorder such as depression and anxiety, lack of energy, as well as recurrent headaches, especially morning headaches. We talked about trying to maintain a healthy lifestyle in general, as well as the importance of weight control. We also talked about the importance of good sleep hygiene. I recommended the following at this time: sleep study. She reports that she does not sleep well outside her home and is requesting to do a home sleep test only. I explained the sleep test procedures to the patient and also outlined possible surgical and non-surgical treatment options of OSA, including the use of a custom-made dental device (which would require a referral to a specialist dentist or oral surgeon),  upper airway surgical options, such as traditional UPPP or a novel less invasive surgical option in the form of Inspire hypoglossal nerve stimulation (which would involve a referral to an ENT surgeon). I also explained the CPAP treatment option to the patient, who indicated that she would be willing to try CPAP if the need arises,But indicates that she is claustrophobic and that she would prefer nasal pillows. I answered all her questions today and the patient was in agreement. I plan to see her back after the sleep study is completed and encouraged her to call with any interim questions, concerns, problems or updates.   Thank you very much for allowing me to participate in the care of this nice patient. If I can be of any further assistance to you please do not hesitate to call me at [phone].  Sincerely,   Star Age, MD, PhD

## 2019-06-18 ENCOUNTER — Other Ambulatory Visit: Payer: Self-pay

## 2019-06-18 ENCOUNTER — Ambulatory Visit (INDEPENDENT_AMBULATORY_CARE_PROVIDER_SITE_OTHER): Payer: 59 | Admitting: Adult Health

## 2019-06-18 ENCOUNTER — Encounter: Payer: Self-pay | Admitting: Adult Health

## 2019-06-18 DIAGNOSIS — F3130 Bipolar disorder, current episode depressed, mild or moderate severity, unspecified: Secondary | ICD-10-CM | POA: Diagnosis not present

## 2019-06-18 DIAGNOSIS — G4700 Insomnia, unspecified: Secondary | ICD-10-CM

## 2019-06-18 NOTE — Progress Notes (Signed)
Anita Weaver [id number] 1976/02/26 44 y.o.  Subjective:   Patient ID:  Anita Weaver is a 44 y.o. (DOB 03/01/1976) female.  Chief Complaint: No chief complaint on file.   HPI [name] presents to the office today for follow-up of BPD 1 and insomnia.  Describes mood today as "ok". Pleasant. Mood symptoms - denies depression, anxiety, and irritability. Stating "I'm doing good". Continues to do well with Latuda 27m daily. Stable interest and motivation. Taking medications as prescribed.  Energy levels "not great". Feels tired a lot. Has "always had issues with fatigue". Recently diagnosed with low Vitamin D. Has been taking supplements for the past 2 months.  Active, does not have a regular exercise routine. 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 Married. Lives with husband of 7 years. Has 3 grown sons. Parents live in [place]  Appetite adequate. Weight stable - 200 last visit 190 - 5' 8" . Sleeps better some nights than others. Sick in January - breathing issues waking her up. Has a referral to pulmonogy Focus and concentration stable. Completing tasks. Managing aspects of household. Works full time as a pAir traffic controller- 4 to 5 years.  Denies SI or HI. Denies AH or VH.   PHQ2-9     [facility]Q-2 Total Score  0  0  0  0  0  PHQ-9 Total Score  0  0  0  3  --       Review of Systems:  Review of Systems  Musculoskeletal: Negative for gait problem.  Neurological: Negative for tremors.  Psychiatric/Behavioral:       Please refer to HPI    Medications: I have reviewed the patient's current  medications.  Current Outpatient Medications  Medication Sig Dispense Refill  . albuterol (PROAIR HFA) 108 (90 Base) MCG/ACT inhaler Inhale 2 puffs into the lungs every 4 (four) hours as needed for wheezing or shortness of breath. 18 g 2  . BREO ELLIPTA 100-25 MCG/INH AEPB TAKE 1 PUFF BY MOUTH EVERY DAY 60 each 0  . EPINEPHrine (EPIPEN 2-PAK) 0.3 mg/0.3 mL IJ SOAJ injection Inject 0.3 mLs (0.3 mg total) into the muscle once as needed (for severe allergic reaction). CAll 911 immediately if you have to use this medicine (Patient not taking: Reported on 06/16/2019) 2 each 1  . lurasidone (LATUDA) 40 MG TABS tablet Take 1 tablet (40 mg total) by mouth daily with breakfast. 90 tablet 1  . medroxyPROGESTERone (DEPO-PROVERA) 150 MG/ML injection Inject 1 mL (150 mg total) into the muscle every 3 (three) months. 1 mL 3  . ondansetron (ZOFRAN) 4 MG tablet Take one tablet every 8 hours as needed. #10 to last 30 days. For migraines. 10 tablet 5  . pantoprazole (PROTONIX) 40 MG tablet Take 1 tablet (40 mg total) by mouth 2 (two) times daily. 60 tablet 11  . tiZANidine (ZANAFLEX) 4 MG tablet Take 1 tablet (4 mg total) by mouth every 6 (six) hours as needed for muscle spasms. 30 tablet 3   No current facility-administered medications for this visit.    Medication Side Effects: None  Allergies:  Allergies  Allergen Reactions  . Imitrex [Sumatriptan] Other (  See Comments)    Heart palpitations, hot/cold sweats  . Demerol [Meperidine] Other (See Comments)    hallucinations  . Flagyl [Metronidazole] Hives    Past Medical History:  Diagnosis Date  . Allergy   . Arthritis   . Asthma   . Chronic pelvic pain in female   . Endometriosis   . FH: breast cancer in first degree relative    Mother and sister both are BRCA positive; patient is BRCA negative  . GERD (gastroesophageal reflux disease)   . Headache    migraines  . Heart murmur   . Hyperlipidemia   . Hypertension   . Interstitial cystitis   .  Irritable bowel disease   . Neck pain     Family History  Problem Relation Age of Onset  . Cancer Mother 66       breast  . Breast cancer Mother 31  . Cancer Sister 17  . Breast cancer Sister 68  . Kidney disease Son   . Heart attack Maternal Grandfather   . Heart attack Paternal Grandmother   . Heart attack Paternal Grandfather     Social History   Socioeconomic History  . Marital status: Married    Spouse name: [name]  . Number of children: 3  . Years of education: 41  . 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 Smoking status: Never Smoker  . Smokeless tobacco: Never Used  Substance and Sexual Activity  . Alcohol use: Yes    Alcohol/week: 0.0 standard drinks    Comment: occasionally  . Drug use: No  . Sexual activity: Yes    Partners: Male    Birth control/protection: Surgical    Comment: tubaligation  Other Topics Concern  . Not on file  Social History Narrative   ** Merged History Encounter **       ** Data from: 02/09/14 Enc Dept: [facility]       ** Data from: 11/10/13 Enc Dept: [facility]   Patient lives at home with [name]   Patient works full time.   Right handed.   Caffeine- none   Education- high school   Social Determinants of Health   Financial Resource Strain:   . Difficulty of Paying Living Expenses: Not on file  Food Insecurity:   . Worried About Charity fundraiser in the Last Year: Not on file  . Ran Out of Food in the Last Year: Not on file  Transportation Needs:   . Lack of Transportation (Medical): Not on file  . Lack of Transportation (Non-Medical): Not on file  Physical Activity:   . Days of Exercise per Week: Not on file  . Minutes of Exercise per Session: Not on file  Stress:   . Feeling of Stress : Not on file  Social Connections:   . Frequency of Communication with Friends and Family: Not on file  . Frequency of Social Gatherings with Friends  and Family: Not on file  . Attends Religious Services: Not on file  . Active Member of Clubs or Organizations: Not on file  . Attends Archivist Meetings: Not on file  . Marital Status: Not on file  Intimate Partner Violence:   . Fear of Current or Ex-Partner: Not on file  . Emotionally Abused: Not on file  . Physically Abused: Not on file  . Sexually Abused: Not on file    Past  Medical History, Surgical history, Social history, and Family history were reviewed and updated as appropriate.   Please see review of systems for further details on the patient's review from today.   Objective:   Physical Exam:  There were no vitals taken for this visit.  Physical Exam Constitutional:      General: She is not in acute distress. Musculoskeletal:        General: No deformity.  Neurological:     Mental Status: She is alert and oriented to person, place, and time.     Coordination: Coordination normal.  Psychiatric:        Attention and Perception: Attention and perception normal. She does not perceive auditory or visual hallucinations.        Mood and Affect: Mood normal. Mood is not anxious or depressed. Affect is not labile, blunt, angry or inappropriate.        Speech: Speech normal.        Behavior: Behavior normal.        Thought Content: Thought content normal. Thought content is not paranoid or delusional. Thought content does not include homicidal or suicidal ideation. Thought content does not include homicidal or suicidal plan.        Cognition and Memory: Cognition and memory normal.        Judgment: Judgment normal.     Comments: Insight intact     Lab Review:     Component Value Date/Time   NA 138 05/12/2019 1620   NA 139 03/03/2013 1545   K 3.7 05/12/2019 1620   K 3.7 03/03/2013 1545   CL 103 05/12/2019 1620   CL 105 03/03/2013 1545   CO2 23 05/12/2019 1620   CO2 28 03/03/2013 1545   GLUCOSE 88 05/12/2019 1620   GLUCOSE 106 (H) 03/03/2013 1545   BUN  13 05/12/2019 1620   BUN 11 03/03/2013 1545   CREATININE 1.07 05/12/2019 1620   CALCIUM 9.5 05/12/2019 1620   CALCIUM 8.8 03/03/2013 1545   PROT 7.3 05/12/2019 1620   PROT 7.3 03/03/2013 1545   ALBUMIN 3.8 03/03/2013 1545   AST 13 05/12/2019 1620   AST 8 (L) 03/03/2013 1545   ALT 18 05/12/2019 1620   ALT 15 03/03/2013 1545   ALKPHOS 41 (L) 03/03/2013 1545   BILITOT 0.5 05/12/2019 1620   BILITOT 0.4 03/03/2013 1545   GFRNONAA 64 05/12/2019 1620   GFRAA 74 05/12/2019 1620       Component Value Date/Time   WBC 8.2 05/20/2019 1455   RBC 4.96 05/20/2019 1455   HGB 15.2 05/20/2019 1455   HGB 13.8 03/03/2013 1545   HCT 44.1 05/20/2019 1455   HCT 40.2 03/03/2013 1545   PLT 351 05/20/2019 1455   PLT 288 03/03/2013 1545   MCV 88.9 05/20/2019 1455   MCV 85 03/03/2013 1545   MCH 30.6 05/20/2019 1455   MCHC 34.5 05/20/2019 1455   RDW 12.4 05/20/2019 1455   RDW 13.3 03/03/2013 1545   LYMPHSABS 3,501 05/20/2019 1455   LYMPHSABS 2.9 03/03/2013 1545   MONOABS 752 01/20/2016 1603   MONOABS 0.6 03/03/2013 1545   EOSABS 238 05/20/2019 1455   EOSABS 0.2 03/03/2013 1545   BASOSABS 49 05/20/2019 1455   BASOSABS 0.0 03/03/2013 1545    No results found for: POCLITH, LITHIUM   No results found for: PHENYTOIN, PHENOBARB, VALPROATE, CBMZ   .res Assessment: Plan:   Plan:  1. Latuda 95m daily  Read and reviewed note with patient for accuracy.  RTC 6 months  Patient advised to contact office with any questions, adverse effects, or acute worsening in signs and symptoms.  Discussed potential metabolic side effects associated with atypical antipsychotics, as well as potential risk for movement side effects. Advised pt to contact office if movement side effects occur.   There are no diagnoses linked to this encounter.   Please see After Visit Summary for patient specific instructions.  Future Appointments  Date Time Provider [place]  06/18/2019  3:40 PM Shaasia Odle, Berdie Ogren, NP [facility] None  07/08/2019  2:40 PM Laurell Roof [place] [facility]  07/23/2019  3:00 PM [facility]UR[facility]    No orders of the defined types were placed in this encounter.   -------------------------------

## 2019-06-27 ENCOUNTER — Other Ambulatory Visit: Payer: Self-pay | Admitting: Adult Health

## 2019-06-27 DIAGNOSIS — F3130 Bipolar disorder, current episode depressed, mild or moderate severity, unspecified: Secondary | ICD-10-CM

## 2019-06-27 DIAGNOSIS — G4700 Insomnia, unspecified: Secondary | ICD-10-CM

## 2019-07-08 ENCOUNTER — Other Ambulatory Visit: Payer: Self-pay

## 2019-07-08 ENCOUNTER — Encounter: Payer: Self-pay | Admitting: Family Medicine

## 2019-07-08 ENCOUNTER — Ambulatory Visit: Payer: 59 | Admitting: Family Medicine

## 2019-07-08 VITALS — BP 112/64 | HR 98 | Temp 98.1°F | Resp 14 | Ht 68.0 in | Wt 210.3 lb

## 2019-07-08 DIAGNOSIS — J4521 Mild intermittent asthma with (acute) exacerbation: Secondary | ICD-10-CM

## 2019-07-08 DIAGNOSIS — J45901 Unspecified asthma with (acute) exacerbation: Secondary | ICD-10-CM | POA: Diagnosis not present

## 2019-07-08 DIAGNOSIS — R0600 Dyspnea, unspecified: Secondary | ICD-10-CM

## 2019-07-08 MED ORDER — NEBULIZER/TUBING/MOUTHPIECE KIT: PACK | 0 refills | Status: DC

## 2019-07-08 MED ORDER — BREO ELLIPTA 100-25 MCG/INH IN AEPB: INHALATION_SPRAY | RESPIRATORY_TRACT | 2 refills | Status: DC

## 2019-07-08 MED ORDER — PANTOPRAZOLE SODIUM 40 MG PO TBEC: 40.0000 mg | DELAYED_RELEASE_TABLET | Freq: Every day | ORAL | 11 refills | Status: DC

## 2019-07-08 NOTE — Progress Notes (Signed)
Patient ID: Anita Weaver, female    DOB: 02/21/1976, 44 y.o.   MRN: [id number]  PCP: [name], PA-C  Chief Complaint  Patient presents with  . Referral    pulmonology, gasping for air at night    Subjective:   Anita Weaver is a 44 y.o. female, presents to clinic with CC of the following:  HPI  Waking up at night frequently since illness in Jan when she was suspected to have pneumonia or COVID She did a visit virtually saw someone in person that was a lung specialist possibly at the respiratory clinic and although she tested negative many times for Covid it was thought that she had COVID.  Patient was initially put on multiple inhalers and when I saw her last the beginning of February she endorsed improved symptoms that time.  She was taking Breo and using albuterol as needed She stopped taking Breo more than a month ago symptoms seem to be difficulty breathing symptoms occur at night for like she is gasping or wheeze he also has some congestion and tightness the back of her throat and upper airways   Patient denies any palpitations, lower extremity edema, weight gain, orthopnea.  She does have some congestion more difficulty breathing when laying flat, possibly some cough Wt Readings from Last 8 Encounters:  07/08/19 210 lb 4.8 oz (95.4 kg)  06/16/19 210 lb 5 oz (95.4 kg)  05/23/19 208 lb 9.6 oz (94.6 kg)  05/12/19 210 lb (95.3 kg)  04/25/19 205 lb 6.4 oz (93.2 kg)  01/23/19 198 lb 11.2 oz (90.1 kg)  12/04/18 194 lb 3.2 oz (88.1 kg)  07/09/18 190 lb (86.2 kg)   BMI Readings from Last 5 Encounters:  07/08/19 31.98 kg/m  06/16/19 31.98 kg/m  05/23/19 31.72 kg/m  05/12/19 31.93 kg/m  04/25/19 31.23 kg/m      Patient Active Problem List   Diagnosis Date Noted  . Arthritis 07/09/2018  . Chronic interstitial cystitis 07/09/2018  . Irritable bowel syndrome 07/09/2018  . Scoliosis deformity of spine 07/09/2018  . Asthma 04/27/2017  . Acid reflux 04/27/2017  .  Hiatal hernia with GERD 04/27/2017  . Contact dermatitis 05/15/2016  . Fatigue 01/20/2016  . Mild dysplasia of cervix (CIN I) 09/29/2014  . Pap smear abnormality of cervix with LGSIL 09/21/2014  . FH: genetic disease carrier 04/22/2014  . Migraine headache 05/08/2013  . Bipolar disorder, unspecified ([place]) 05/08/2013  . Endometriosis 04/18/2012  . Chronic pelvic pain in female 04/18/2012  . Female genital symptoms 04/18/2012      Current Outpatient Medications:  .  albuterol (PROAIR HFA) 108 (90 Base) MCG/ACT inhaler, Inhale 2 puffs into the lungs every 4 (four) hours as needed for wheezing or shortness of breath., Disp: 18 g, Rfl: 2 .  LATUDA 40 MG TABS tablet, TAKE 1 TABLET (40 MG TOTAL) BY MOUTH DAILY WITH BREAKFAST., Disp: 30 tablet, Rfl: 5 .  medroxyPROGESTERone (DEPO-PROVERA) 150 MG/ML injection, Inject 1 mL (150 mg total) into the muscle every 3 (three) months., Disp: 1 mL, Rfl: 3 .  ondansetron (ZOFRAN) 4 MG tablet, Take one tablet every 8 hours as needed. #10 to last 30 days. For migraines., Disp: 10 tablet, Rfl: 5 .  pantoprazole (PROTONIX) 40 MG tablet, Take 1 tablet (40 mg total) by mouth 2 (two) times daily., Disp: 60 tablet, Rfl: 11 .  tiZANidine (ZANAFLEX) 4 MG tablet, Take 1 tablet (4 mg total) by mouth every 6 (six) hours as needed for muscle  spasms., Disp: 30 tablet, Rfl: 3 .  BREO ELLIPTA 100-25 MCG/INH AEPB, TAKE 1 PUFF BY MOUTH EVERY DAY (Patient not taking: Reported on 07/08/2019), Disp: 60 each, Rfl: 0 .  EPINEPHrine (EPIPEN 2-PAK) 0.3 mg/0.3 mL IJ SOAJ injection, Inject 0.3 mLs (0.3 mg total) into the muscle once as needed (for severe allergic reaction). CAll 911 immediately if you have to use this medicine (Patient not taking: Reported on 06/16/2019), Disp: 2 each, Rfl: 1   Allergies  Allergen Reactions  . Imitrex [Sumatriptan] Other (See Comments)    Heart palpitations, hot/cold sweats  . Demerol [Meperidine] Other (See Comments)    hallucinations  . Flagyl  [Metronidazole] Hives     Family History  Problem Relation Age of Onset  . Cancer Mother 55       breast  . Breast cancer Mother 83  . Cancer Sister 79  . Breast cancer Sister 52  . Kidney disease Son   . Heart attack Maternal Grandfather   . Heart attack Paternal Grandmother   . Heart attack Paternal Grandfather      Social History   Socioeconomic History  . Marital status: Married    Spouse name: [name]  . Number of children: 3  . Years of education: 51  . 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 Smoking status: Never Smoker  . Smokeless tobacco: Never Used  Substance and Sexual Activity  . Alcohol use: Yes    Alcohol/week: 0.0 standard drinks    Comment: occasionally  . Drug use: No  . Sexual activity: Yes    Partners: Male    Birth control/protection: Surgical    Comment: tubaligation  Other Topics Concern  . Not on file  Social History Narrative   ** Merged History Encounter **       ** Data from: 02/09/14 Enc Dept: [facility]       ** Data from: 11/10/13 Enc Dept: [facility]   Patient lives at home with [name]   Patient works full time.   Right handed.   Caffeine- none   Education- high school   Social Determinants of Health   Financial Resource Strain:   . Difficulty of Paying Living Expenses:   Food Insecurity:   . Worried About Charity fundraiser in the Last Year:   . Arboriculturist in the Last Year:   Transportation Needs:   . Film/video editor (Medical):   Marland Kitchen Lack of Transportation (Non-Medical):   Physical Activity:   . Days of Exercise per Week:   . Minutes of Exercise per Session:   Stress:   . Feeling of Stress :   Social Connections:   . Frequency of Communication with Friends and Family:   . Frequency of Social Gatherings with Friends and Family:   . Attends Religious Services:   . Active Member of Clubs or Organizations:   . Attends Theatre manager Meetings:   Marland Kitchen Marital Status:   Intimate Partner Violence:   . Fear of Current or Ex-Partner:   . Emotionally Abused:   Marland Kitchen Physically Abused:   . Sexually Abused:     Chart Review Today: I personally reviewed active problem list, medication list, allergies, family history, social history, health maintenance, notes from last encounter, lab results, imaging with the patient/caregiver today.   Review of Systems 10 Systems reviewed and are negative for acute change except  as noted in the HPI.     Objective:   Vitals:   07/08/19 1457  BP: 112/64  Pulse: 98  Resp: 14  Temp: 98.1 F (36.7 C)  SpO2: 96%  Weight: 210 lb 4.8 oz (95.4 kg)  Height: 5\' 8"  (1.727 m)    Body mass index is 31.98 kg/m.  Physical Exam Vitals and nursing note reviewed.  Constitutional:      General: She is not in acute distress.    Appearance: Normal appearance. She is well-developed. She is obese. She is not ill-appearing, toxic-appearing or diaphoretic.     Interventions: Face mask in place.  HENT:     Head: Normocephalic and atraumatic.     Right Ear: External ear normal.     Left Ear: External ear normal.  Eyes:     General: Lids are normal. No scleral icterus.       Right eye: No discharge.        Left eye: No discharge.     Conjunctiva/sclera: Conjunctivae normal.  Neck:     Trachea: Phonation normal. No tracheal deviation.  Cardiovascular:     Rate and Rhythm: Normal rate and regular rhythm.     Pulses: Normal pulses.          Radial pulses are 2+ on the right side and 2+ on the left side.       Posterior tibial pulses are 2+ on the right side and 2+ on the left side.     Heart sounds: Normal heart sounds. No murmur. No friction rub. No gallop.   Pulmonary:     Effort: Pulmonary effort is normal. No respiratory distress.     Breath sounds: Normal breath sounds. No stridor. No wheezing, rhonchi or rales.  Chest:     Chest wall: No tenderness.  Abdominal:     General: Bowel  sounds are normal. There is no distension.     Palpations: Abdomen is soft.     Tenderness: There is no abdominal tenderness. There is no guarding or rebound.  Musculoskeletal:        General: No deformity. Normal range of motion.     Cervical back: Normal range of motion and neck supple.     Right lower leg: No edema.     Left lower leg: No edema.  Lymphadenopathy:     Cervical: No cervical adenopathy.  Skin:    General: Skin is warm and dry.     Capillary Refill: Capillary refill takes less than 2 seconds.     Coloration: Skin is not jaundiced or pale.     Findings: No rash.  Neurological:     Mental Status: She is alert and oriented to person, place, and time.     Motor: No abnormal muscle tone.     Gait: Gait normal.  Psychiatric:        Mood and Affect: Mood normal.        Speech: Speech normal.        Behavior: Behavior normal.      Results for orders placed or performed in visit on 05/14/19  CBC w/Diff/Platelet  Result Value Ref Range   WBC 8.2 3.8 - 10.8 Thousand/uL   RBC 4.96 3.80 - 5.10 Million/uL   Hemoglobin 15.2 11.7 - 15.5 g/dL   HCT 44.1 35.0 - 45.0 %   MCV 88.9 80.0 - 100.0 fL   MCH 30.6 27.0 - 33.0 pg   MCHC 34.5 32.0 - 36.0 g/dL   RDW  12.4 11.0 - 15.0 %   Platelets 351 140 - 400 Thousand/uL   MPV 8.9 7.5 - 12.5 fL   Neutro Abs 3,936 1,500 - 7,800 cells/uL   Lymphs Abs 3,501 850 - 3,900 cells/uL   Absolute Monocytes 476 200 - 950 cells/uL   Eosinophils Absolute 238 15 - 500 cells/uL   Basophils Absolute 49 0 - 200 cells/uL   Neutrophils Relative % 48 %   Total Lymphocyte 42.7 %   Monocytes Relative 5.8 %   Eosinophils Relative 2.9 %   Basophils Relative 0.6 %        Assessment & Plan:      ICD-10-CM   1. Moderate asthma with exacerbation, unspecified whether persistent  J45.901 Ambulatory referral to Pulmonology  2. PND (paroxysmal nocturnal dyspnea)  R06.00 Ambulatory referral to Pulmonology    Patient is having worsening respiratory  symptoms following a respiratory infection about 3 to 4 months ago she did test negative for Covid but was suspected to have been positive.  Initially she had severe symptoms throughout the day it did gradually improve when she was on a maintenance inhaler, Breo using rescue inhaler but she has since discontinued the [place].   Her PND does not sound cardiac in nature she has no other associated cardiac symptoms or signs concerning for CHF and she is euvolemic today.  Suspect she may have some continued respiratory symptoms, reactive airway or possibly even some GERD and reflux causing symptoms that are worse at night when laying flat.  Encouraged her to resume Breo, start Protonix see if these things decrease her nighttime waking and respiratory symptoms.  If no improvement I suggested getting a nebulizer machine and we could send in DuoNeb so she can do treatment before going to bed.    She does want a referral to pulmonology which will be put in for her.  [name], PA-C 07/08/19 3:06 PM

## 2019-07-23 ENCOUNTER — Ambulatory Visit: Payer: 59

## 2019-07-24 ENCOUNTER — Other Ambulatory Visit: Payer: Self-pay

## 2019-07-24 ENCOUNTER — Ambulatory Visit (INDEPENDENT_AMBULATORY_CARE_PROVIDER_SITE_OTHER): Payer: 59 | Admitting: *Deleted

## 2019-07-24 VITALS — BP 137/91

## 2019-07-24 DIAGNOSIS — Z3042 Encounter for surveillance of injectable contraceptive: Secondary | ICD-10-CM

## 2019-07-24 MED ORDER — MEDROXYPROGESTERONE ACETATE 150 MG/ML IM SUSP
150.0000 mg | Freq: Once | INTRAMUSCULAR | Status: AC
  Administered 2019-07-24: 150 mg via INTRAMUSCULAR

## 2019-07-24 NOTE — Progress Notes (Signed)
Date last pap: Pt gets PAP's done with Dr Cletis Media. Last Depo-Provera: 05/07/2019. Side Effects if any: none. Serum HCG indicated? NA. Depo-Provera 150 mg IM given [id number] Lonia Farber, Therapist, sports. Next appointment due July 1-15th.

## 2019-07-25 NOTE — Progress Notes (Signed)
Patient seen and assessed by nursing staff during this encounter. I have reviewed the chart and agree with the documentation and plan.  Verita Schneiders, MD 07/25/2019 8:12 AM

## 2019-08-05 ENCOUNTER — Other Ambulatory Visit: Payer: Self-pay

## 2019-08-05 ENCOUNTER — Ambulatory Visit: Payer: 59 | Admitting: Family Medicine

## 2019-08-05 ENCOUNTER — Encounter: Payer: Self-pay | Admitting: Family Medicine

## 2019-08-05 ENCOUNTER — Ambulatory Visit
Admission: RE | Admit: 2019-08-05 | Discharge: 2019-08-05 | Disposition: A | Discharge status: Home / Self Care | Payer: 59 | Source: Ambulatory Visit | Attending: Family Medicine | Admitting: Family Medicine

## 2019-08-05 VITALS — BP 118/74 | HR 98 | Temp 97.5°F | Resp 14 | Ht 68.0 in | Wt 212.2 lb

## 2019-08-05 DIAGNOSIS — R109 Unspecified abdominal pain: Secondary | ICD-10-CM

## 2019-08-05 DIAGNOSIS — R319 Hematuria, unspecified: Secondary | ICD-10-CM

## 2019-08-05 DIAGNOSIS — M545 Low back pain, unspecified: Secondary | ICD-10-CM

## 2019-08-05 DIAGNOSIS — Z841 Family history of disorders of kidney and ureter: Secondary | ICD-10-CM | POA: Diagnosis not present

## 2019-08-05 LAB — POCT URINALYSIS DIPSTICK
Bilirubin, UA: NEGATIVE
Blood, UA: POSITIVE
Glucose, UA: NEGATIVE
Ketones, UA: NEGATIVE
Leukocytes, UA: NEGATIVE
Nitrite, UA: NEGATIVE
Odor: NORMAL
Protein, UA: NEGATIVE
Spec Grav, UA: 1.015 (ref 1.010–1.025)
Urobilinogen, UA: 0.2 E.U./dL
pH, UA: 6.5 (ref 5.0–8.0)

## 2019-08-05 MED ORDER — TAMSULOSIN HCL 0.4 MG PO CAPS: 0.4000 mg | ORAL_CAPSULE | Freq: Every day | ORAL | 0 refills | Status: DC

## 2019-08-05 MED ORDER — KETOROLAC TROMETHAMINE 60 MG/2ML IM SOLN
60.0000 mg | Freq: Once | INTRAMUSCULAR | Status: AC
  Administered 2019-08-05: 30 mg via INTRAMUSCULAR

## 2019-08-05 MED ORDER — TRAMADOL HCL 50 MG PO TABS: 50.0000 mg | ORAL_TABLET | Freq: Three times a day (TID) | ORAL | 0 refills | Status: AC | PRN

## 2019-08-05 MED ORDER — ONDANSETRON HCL 4 MG PO TABS: ORAL_TABLET | ORAL | 5 refills | Status: DC

## 2019-08-05 NOTE — Patient Instructions (Signed)
Flank pain is most suspicious for UTI progressing to kidney infections vs kidney stone vs musculoskeletal pain/strain.   Urinary Tract Infection, Adult A urinary tract infection (UTI) is an infection of any part of the urinary tract. The urinary tract includes:  The kidneys.  The ureters.  The bladder.  The urethra. These organs make, store, and get rid of pee (urine) in the body. What are the causes? This is caused by germs (bacteria) in your genital area. These germs grow and cause swelling (inflammation) of your urinary tract. What increases the risk? You are more likely to develop this condition if:  You have a small, thin tube (catheter) to drain pee.  You cannot control when you pee or poop (incontinence).  You are female, and: ? You use these methods to prevent pregnancy:  A medicine that kills sperm (spermicide).  A device that blocks sperm (diaphragm). ? You have low levels of a female hormone (estrogen). ? You are pregnant.  You have genes that add to your risk.  You are sexually active.  You take antibiotic medicines.  You have trouble peeing because of: ? A prostate that is bigger than normal, if you are female. ? A blockage in the part of your body that drains pee from the bladder (urethra). ? A kidney stone. ? A nerve condition that affects your bladder (neurogenic bladder). ? Not getting enough to drink. ? Not peeing often enough.  You have other conditions, such as: ? Diabetes. ? A weak disease-fighting system (immune system). ? Sickle cell disease. ? Gout. ? Injury of the spine. What are the signs or symptoms? Symptoms of this condition include:  Needing to pee right away (urgently).  Peeing often.  Peeing small amounts often.  Pain or burning when peeing.  Blood in the pee.  Pee that smells bad or not like normal.  Trouble peeing.  Pee that is cloudy.  Fluid coming from the vagina, if you are female.  Pain in the belly or lower  back. Other symptoms include:  Throwing up (vomiting).  No urge to eat.  Feeling mixed up (confused).  Being tired and grouchy (irritable).  A fever.  Watery poop (diarrhea). How is this treated? This condition may be treated with:  Antibiotic medicine.  Other medicines.  Drinking enough water. Follow these instructions at home:  Medicines  Take over-the-counter and prescription medicines only as told by your doctor.  If you were prescribed an antibiotic medicine, take it as told by your doctor. Do not stop taking it even if you start to feel better. General instructions  Make sure you: ? Pee until your bladder is empty. ? Do not hold pee for a long time. ? Empty your bladder after sex. ? Wipe from front to back after pooping if you are a female. Use each tissue one time when you wipe.  Drink enough fluid to keep your pee pale yellow.  Keep all follow-up visits as told by your doctor. This is important. Contact a doctor if:  You do not get better after 1-2 days.  Your symptoms go away and then come back. Get help right away if:  You have very bad back pain.  You have very bad pain in your lower belly.  You have a fever.  You are sick to your stomach (nauseous).  You are throwing up. Summary  A urinary tract infection (UTI) is an infection of any part of the urinary tract.  This condition is caused by germs  in your genital area.  There are many risk factors for a UTI. These include having a small, thin tube to drain pee and not being able to control when you pee or poop.  Treatment includes antibiotic medicines for germs.  Drink enough fluid to keep your pee pale yellow. This information is not intended to replace advice given to you by your health care provider. Make sure you discuss any questions you have with your health care provider. Document Revised: 03/14/2018 Document Reviewed: 10/04/2017 Elsevier Patient Education  2020 [employer].   Kidney Stones Kidney stones are rock-like masses that form inside of the kidneys. Kidneys are organs that make pee (urine). A kidney stone may move into other parts of the urinary tract, including:  The tubes that connect the kidneys to the bladder (ureters).  The bladder.  The tube that carries urine out of the body (urethra). Kidney stones can cause very bad pain and can block the flow of pee. The stone usually leaves your body (passes) through your pee. You may need to have a doctor take out the stone. What are the causes? Kidney stones may be caused by:  A condition in which certain glands make too much parathyroid hormone (primary hyperparathyroidism).  A buildup of a type of crystals in the bladder made of a chemical called uric acid. The body makes uric acid when you eat certain foods.  Narrowing (stricture) of one or both of the ureters.  A kidney blockage that you were born with.  Past surgery on the kidney or the ureters, such as gastric bypass surgery. What increases the risk? You are more likely to develop this condition if:  You have had a kidney stone in the past.  You have a family history of kidney stones.  You do not drink enough water.  You eat a diet that is high in protein, salt (sodium), or sugar.  You are overweight or very overweight (obese). What are the signs or symptoms? Symptoms of a kidney stone may include:  Pain in the side of the belly, right below the ribs (flank pain). Pain usually spreads (radiates) to the groin.  Needing to pee often or right away (urgently).  Pain when going pee (urinating).  Blood in your pee (hematuria).  Feeling like you may vomit (nauseous).  Vomiting.  Fever and chills. How is this treated? Treatment depends on the size, location, and makeup of the kidney stones. The stones will often pass out of the body through peeing. You may need to:  Drink more fluid to help pass the stone. In some cases, you  may be given fluids through an IV tube put into one of your veins at the hospital.  Take medicine for pain.  Make changes in your diet to help keep kidney stones from coming back. Sometimes, medical procedures are needed to remove a kidney stone. This may involve:  A procedure to break up kidney stones using a beam of light (laser) or shock waves.  Surgery to remove the kidney stones. Follow these instructions at home: Medicines  Take over-the-counter and prescription medicines only as told by your doctor.  Ask your doctor if the medicine prescribed to you requires you to avoid driving or using heavy machinery. Eating and drinking  Drink enough fluid to keep your pee pale yellow. You may be told to drink at least 8-10 glasses of water each day. This will help you pass the stone.  If told by your doctor, change your  diet. This may include: ? Limiting how much salt you eat. ? Eating more fruits and vegetables. ? Limiting how much meat, poultry, fish, and eggs you eat.  Follow instructions from your doctor about eating or drinking restrictions. General instructions  Collect pee samples as told by your doctor. You may need to collect a pee sample: ? 24 hours after a stone comes out. ? 8-12 weeks after a stone comes out, and every 6-12 months after that.  Strain your pee every time you pee (urinate), for as long as told. Use the strainer that your doctor recommends.  Do not throw out the stone. Keep it so that it can be tested by your doctor.  Keep all follow-up visits as told by your doctor. This is important. You may need follow-up tests. How is this prevented? To prevent another kidney stone:  Drink enough fluid to keep your pee pale yellow. This is the best way to prevent kidney stones.  Eat healthy foods.  Avoid certain foods as told by your doctor. You may be told to eat less protein.  Stay at a healthy weight. Where to find more information  [place] [facility]): www.kidney.[facility]): www.urologyhealth.org Contact a doctor if:  You have pain that gets worse or does not get better with medicine. Get help right away if:  You have a fever or chills.  You get very bad pain.  You get new pain in your belly (abdomen).  You pass out (faint).  You cannot pee. Summary  Kidney stones are rock-like masses that form inside of the kidneys.  Kidney stones can cause very bad pain and can block the flow of pee.  The stones will often pass out of the body through peeing.  Drink enough fluid to keep your pee pale yellow. This information is not intended to replace advice given to you by your health care provider. Make sure you discuss any questions you have with your health care provider. Document Revised: 08/13/2018 Document Reviewed: 08/13/2018 Elsevier Patient Education  [place].

## 2019-08-05 NOTE — Progress Notes (Signed)
Patient ID: Anita Weaver, female    DOB: September 09, 1975, 44 y.o.   MRN: [id number]  PCP: [name], PA-C  Chief Complaint  Patient presents with  . Flank Pain    Subjective:   Anita Weaver is a 44 y.o. female, presents to clinic with CC of the following:  HPI  Here with right flank pain (reported kidney pain) Fairly sudden onset of right flank pain onset last wed, waxed and waned and went away for a few days then came back more severe and more constant.  Does seem to come in waves.     Results for orders placed or performed in visit on 08/05/19  POCT urinalysis dipstick  Result Value Ref Range   Color, UA yellow    Clarity, UA clear    Glucose, UA Negative Negative   Bilirubin, UA neg    Ketones, UA neg    Spec Grav, UA 1.015 1.010 - 1.025   Blood, UA positive    pH, UA 6.5 5.0 - 8.0   Protein, UA Negative Negative   Urobilinogen, UA 0.2 0.2 or 1.0 E.U./dL   Nitrite, UA neg    Leukocytes, UA Negative Negative   Appearance clear    Odor normal    Son and sister with kidney disease  IgA nephropathy for her son Hx of IC for pt, no recent urinary sx She reports hx of hematuria, but no urology referral in the past and not on meds for any urinary dx. 2014 hematuria  Today hematuria   Did not have any preceding urinary symptoms, she has not had any back strain although does slightly exacerbate her right flank pain if she twists to the left.  She has had some nausea which was relieved with Zofran which she has prescribed for her for as needed use with migraines.  She states her father has a history of kidney stones and she has never had one before.  Currently her pain is severe she did try taking extra strength Tylenol and a few days ago it helped a little bit but today did not touch the pain at all.    Patient Active Problem List   Diagnosis Date Noted  . Arthritis 07/09/2018  . Chronic interstitial cystitis 07/09/2018  . Irritable bowel syndrome 07/09/2018  .  Scoliosis deformity of spine 07/09/2018  . Asthma 04/27/2017  . Acid reflux 04/27/2017  . Hiatal hernia with GERD 04/27/2017  . Contact dermatitis 05/15/2016  . Fatigue 01/20/2016  . Mild dysplasia of cervix (CIN I) 09/29/2014  . Pap smear abnormality of cervix with LGSIL 09/21/2014  . FH: genetic disease carrier 04/22/2014  . Migraine headache 05/08/2013  . Bipolar disorder, unspecified ([place]) 05/08/2013  . Endometriosis 04/18/2012  . Chronic pelvic pain in female 04/18/2012  . Female genital symptoms 04/18/2012      Current Outpatient Medications:  .  albuterol (PROAIR HFA) 108 (90 Base) MCG/ACT inhaler, Inhale 2 puffs into the lungs every 4 (four) hours as needed for wheezing or shortness of breath., Disp: 18 g, Rfl: 2 .  EPINEPHrine (EPIPEN 2-PAK) 0.3 mg/0.3 mL IJ SOAJ injection, Inject 0.3 mLs (0.3 mg total) into the muscle once as needed (for severe allergic reaction). CAll 911 immediately if you have to use this medicine (Patient not taking: Reported on 06/16/2019), Disp: 2 each, Rfl: 1 .  fluticasone furoate-vilanterol (BREO ELLIPTA) 100-25 MCG/INH AEPB, TAKE 1 PUFF BY MOUTH EVERY DAY, Disp: 60 each, Rfl: 2 .  LATUDA 40 MG  TABS tablet, TAKE 1 TABLET (40 MG TOTAL) BY MOUTH DAILY WITH BREAKFAST., Disp: 30 tablet, Rfl: 5 .  medroxyPROGESTERone (DEPO-PROVERA) 150 MG/ML injection, Inject 1 mL (150 mg total) into the muscle every 3 (three) months., Disp: 1 mL, Rfl: 3 .  ondansetron (ZOFRAN) 4 MG tablet, Take one tablet every 8 hours as needed. #10 to last 30 days. For migraines., Disp: 10 tablet, Rfl: 5 .  pantoprazole (PROTONIX) 40 MG tablet, Take 1 tablet (40 mg total) by mouth daily., Disp: 60 tablet, Rfl: 11 .  Respiratory Therapy Supplies (NEBULIZER/TUBING/MOUTHPIECE) KIT, Disp one nebulizer machine, tubing set and mouthpiece kit, Disp: 1 kit, Rfl: 0 .  tiZANidine (ZANAFLEX) 4 MG tablet, Take 1 tablet (4 mg total) by mouth every 6 (six) hours as needed for muscle spasms., Disp: 30  tablet, Rfl: 3   Allergies  Allergen Reactions  . Imitrex [Sumatriptan] Other (See Comments)    Heart palpitations, hot/cold sweats  . Demerol [Meperidine] Other (See Comments)    hallucinations  . Flagyl [Metronidazole] Hives     Family History  Problem Relation Age of Onset  . Cancer Mother 29       breast  . Breast cancer Mother 62  . Cancer Sister 50  . Breast cancer Sister 75  . Kidney disease Son   . Heart attack Maternal Grandfather   . Heart attack Paternal Grandmother   . Heart attack Paternal Grandfather      Social History   Socioeconomic History  . Marital status: Married    Spouse name: Anita Weaver  . Number of children: 3  . Years of education: 38  . 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 Smoking status: Never Smoker  . Smokeless tobacco: Never Used  Substance and Sexual Activity  . Alcohol use: Yes    Alcohol/week: 0.0 standard drinks    Comment: occasionally  . Drug use: No  . Sexual activity: Yes    Partners: Male    Birth control/protection: Surgical    Comment: tubaligation  Other Topics Concern  . Not on file  Social History Narrative   ** Merged History Encounter **       ** Data from: 02/09/14 Enc Dept: [facility]       ** Data from: 11/10/13 Enc Dept: [facility]   Patient lives at home with her husband Anita Weaver)   Patient works full time.   Right handed.   Caffeine- none   Education- high school   Social Determinants of Health   Financial Resource Strain:   . Difficulty of Paying Living Expenses:   Food Insecurity:   . Worried About Charity fundraiser in the Last Year:   . Arboriculturist in the Last Year:   Transportation Needs:   . Film/video editor (Medical):   Marland Kitchen Lack of Transportation (Non-Medical):   Physical Activity:   . Days of Exercise per Week:   . Minutes of Exercise per Session:   Stress:   . Feeling of Stress :   Social Connections:   .  Frequency of Communication with Friends and Family:   . Frequency of Social Gatherings with Friends and Family:   . Attends Religious Services:   . Active Member of Clubs or Organizations:   . Attends Archivist Meetings:   Marland Kitchen Marital Status:   Intimate Partner Violence:   . Fear of Current or Ex-Partner:   .  Emotionally Abused:   Marland Kitchen Physically Abused:   . Sexually Abused:     Chart Review Today: I personally reviewed active problem list, medication list, allergies, family history, social history, health maintenance, notes from last encounter, lab results, imaging with the patient/caregiver today.   Review of Systems 10 Systems reviewed and are negative for acute change except as noted in the HPI.     Objective:   Vitals:   08/05/19 1510  BP: 118/74  Pulse: 98  Resp: 14  Temp: (!) 97.5 F (36.4 C)  SpO2: 96%  Weight: 212 lb 3.2 oz (96.3 kg)  Height: 5' 8"  (1.727 m)    Body mass index is 32.26 kg/m.  Physical Exam Vitals and nursing note reviewed.  Constitutional:      General: She is not in acute distress.    Appearance: She is well-developed. She is not toxic-appearing or diaphoretic.     Comments: Patient appears very uncomfortable but nontoxic Patient wears her sunglasses in exam room  HENT:     Head: Normocephalic and atraumatic.     Right Ear: External ear normal.     Left Ear: External ear normal.     Nose: Nose normal.  Neck:     Trachea: No tracheal deviation.  Cardiovascular:     Rate and Rhythm: Normal rate and regular rhythm.     Pulses: Normal pulses.     Heart sounds: Normal heart sounds. No murmur. No friction rub. No gallop.   Pulmonary:     Effort: Pulmonary effort is normal. No respiratory distress.     Breath sounds: Normal breath sounds. No stridor. No wheezing, rhonchi or rales.  Abdominal:     General: Bowel sounds are normal.     Palpations: Abdomen is soft.     Tenderness: There is no right CVA tenderness or left CVA  tenderness.     Comments: Tender to palpation to right lower lateral back and side  Musculoskeletal:        General: Normal range of motion.  Skin:    General: Skin is warm and dry.     Findings: No rash.  Neurological:     Mental Status: She is alert.     Motor: No abnormal muscle tone.     Coordination: Coordination normal.  Psychiatric:        Behavior: Behavior normal.             Assessment & Plan:      ICD-10-CM   1. Flank pain  R10.9 POCT urinalysis dipstick    CT RENAL STONE STUDY    ketorolac (TORADOL) injection 60 mg    tamsulosin (FLOMAX) 0.4 MG CAPS capsule    traMADol (ULTRAM) 50 MG tablet    ondansetron (ZOFRAN) 4 MG tablet    Microalbumin, urine    Urine Culture    CANCELED: Urine Culture  2. Hematuria, unspecified type  R31.9 CT RENAL STONE STUDY    Microalbumin, urine    Urine Culture  3. Family history of kidney diseases  Z84.1 Microalbumin, urine  4. Family history of kidney stone  Z84.1   5. Acute right-sided low back pain, unspecified whether sciatica present  M54.5 CT RENAL STONE STUDY    ketorolac (TORADOL) injection 60 mg    traMADol (ULTRAM) 50 MG tablet    ondansetron (ZOFRAN) 4 MG tablet     Screening patient's urine labs which I can see only in our EMR there is note of hematuria multiple times but  this was in 2014 and then with today's visit She has history of interstitial cystitis but is not on any medications and is not reporting any recent or current symptoms elated to IC  Her son has a history of IgA nephropathy she believes did have a kidney biopsy her sister also has kidney disease but she is not sure what kind, her father has history of kidney stones  Patient's somewhat sudden onset with colicky nature to the pain does seem somewhat suspicious for nephrolithiasis, did discuss with the patient could also be UTI, pyelonephritis or muscle skeletal pain, but she feels like there is a infection in there and her pain became much more  severe the last day or 2 and has not moved or radiated anywhere she has not had any gross hematuria but has only microscopic on the dip today will send off for culture to rule out UTI patient was also worried about any past or current protein in her urine which the dip was negative will also add on microalbumin to her urine.  The patient was having difficulty moving around the exam room due to pain today and did want to get a stat scan done today if possible this was ordered for CT renal stone study -for discussion of options with ultrasound versus CT  We are currently trying to schedule that  Patient was given a IM injection of Toradol 30 mg today  Plan for going home after CT scan is to treat with pain medication, antiemetics, and low-dose Flomax Information and after visit summary was given on both UTI, Pylo and nephrolithiasis   I did personally review CT imaging- could see aortic atherosclerosis no other significant pathology or kidney stones.  Imaging center did also call and we reviewed the findings which were consistent with this some sigmoid diverticulosis without diverticulitis no other acute pathology noted, patient did leave the imaging center.  We will follow up with her tomorrow with results.  We will need to wait and see if urine culture is positive and treat, when we follow-up with her tomorrow we will see if she wants to go ahead and start antibiotics just in case it is a UTI.  Strong possibility that it may also be muscle skeletal is in the low right back area worse with twisting and slightly worse with palpation no injury or strain and no radiation or sciatica.  She was given tramadol and Zofran and Flomax just in case it was a kidney stone, we will discontinue that   [name], PA-C 08/05/19 3:11 PM

## 2019-08-06 ENCOUNTER — Ambulatory Visit: Payer: 59

## 2019-08-06 LAB — URINE CULTURE
MICRO NUMBER:: 10411960
SPECIMEN QUALITY:: ADEQUATE

## 2019-08-07 ENCOUNTER — Telehealth: Payer: Self-pay | Admitting: Family Medicine

## 2019-08-07 NOTE — Telephone Encounter (Signed)
Copied from [place] [id number]. Topic: General - Other >> Aug 07, 2019 12:06 PM Keene Breath wrote: Reason for CRM: Patient would like the nurse to call her regarding her lab results.  She has some questions.  CB# [phone] before 2:30 or [phone]

## 2019-08-07 NOTE — Telephone Encounter (Signed)
Pt notified of results

## 2019-09-04 ENCOUNTER — Ambulatory Visit: Payer: 59 | Admitting: Pulmonary Disease

## 2019-09-04 ENCOUNTER — Encounter: Payer: Self-pay | Admitting: Pulmonary Disease

## 2019-09-04 ENCOUNTER — Other Ambulatory Visit: Payer: Self-pay

## 2019-09-04 VITALS — BP 132/80 | HR 97 | Temp 98.2°F | Ht 68.0 in | Wt 212.0 lb

## 2019-09-04 DIAGNOSIS — R0600 Dyspnea, unspecified: Secondary | ICD-10-CM

## 2019-09-04 DIAGNOSIS — G479 Sleep disorder, unspecified: Secondary | ICD-10-CM

## 2019-09-04 MED ORDER — BREO ELLIPTA 100-25 MCG/INH IN AEPB: 1.0000 | INHALATION_SPRAY | Freq: Every day | RESPIRATORY_TRACT | 11 refills | Status: DC

## 2019-09-04 MED ORDER — BREO ELLIPTA 100-25 MCG/INH IN AEPB: 1.0000 | INHALATION_SPRAY | Freq: Every day | RESPIRATORY_TRACT | 0 refills | Status: DC

## 2019-09-04 NOTE — Progress Notes (Signed)
 Assessment & Plan:  1. Dyspnea, unspecified type (Primary) - ECHOCARDIOGRAM COMPLETE; Future - Pulmonary Function Test [facility] Only; Future  2. Sleep disturbance   Patient Instructions  We are going to schedule a sleep study at [facility] neurological  Continue Breo and as needed albuterol  for now  We will schedule you for breathing test  Schedule you for an echocardiogram to check your heart  Follow-up in 4 to 6 weeks with me or the nurse practitioner    Please note: late entry documentation due to logistical difficulties during COVID-19 pandemic. This note is filed for information purposes only, and is not intended to be used for billing, nor does it represent the full scope/nature of the visit in question. Please see any associated scanned media linked to date of encounter for additional pertinent information.  Subjective:    HPI: Anita Weaver is a 44 y.o. female presenting to the pulmonology clinic on 09/04/2019 with report of: Pulmonary Consult (Referred by Michelene Cower, PA. Pt c/o SOB- occurs when she lies down and started approx 6 months ago. She states feels like my lungs are feeling up with fluid)     Outpatient Encounter Medications as of 09/04/2019  Medication Sig Note   [DISCONTINUED] albuterol  (PROAIR  HFA) 108 (90 Base) MCG/ACT inhaler Inhale 2 puffs into the lungs every 4 (four) hours as needed for wheezing or shortness of breath.    [DISCONTINUED] cyclobenzaprine  (FLEXERIL ) 10 MG tablet Take 10 mg by mouth 2 (two) times daily as needed.     [DISCONTINUED] LATUDA  40 MG TABS tablet TAKE 1 TABLET (40 MG TOTAL) BY MOUTH DAILY WITH BREAKFAST.    [DISCONTINUED] medroxyPROGESTERone  (DEPO-PROVERA ) 150 MG/ML injection Inject 1 mL (150 mg total) into the muscle every 3 (three) months.    [DISCONTINUED] ondansetron  (ZOFRAN ) 4 MG tablet Take one tablet every 8 hours as needed. #10 to last 30 days. For migraines. (Patient not taking: No sig reported) 12/30/2019: Was not  helping   [DISCONTINUED] pantoprazole  (PROTONIX ) 40 MG tablet Take 1 tablet (40 mg total) by mouth daily.    [DISCONTINUED] tiZANidine  (ZANAFLEX ) 4 MG tablet Take 1 tablet (4 mg total) by mouth every 6 (six) hours as needed for muscle spasms. (Patient not taking: Reported on 05/14/2020)    [DISCONTINUED] EPINEPHrine  (EPIPEN  2-PAK) 0.3 mg/0.3 mL IJ SOAJ injection Inject 0.3 mLs (0.3 mg total) into the muscle once as needed (for severe allergic reaction). CAll 911 immediately if you have to use this medicine (Patient not taking: Reported on 06/16/2019)    [DISCONTINUED] fluticasone  furoate-vilanterol (BREO ELLIPTA ) 100-25 MCG/INH AEPB TAKE 1 PUFF BY MOUTH EVERY DAY    [DISCONTINUED] fluticasone  furoate-vilanterol (BREO ELLIPTA ) 100-25 MCG/INH AEPB Inhale 1 puff into the lungs daily. (Patient not taking: Reported on 10/09/2019)    [DISCONTINUED] fluticasone  furoate-vilanterol (BREO ELLIPTA ) 100-25 MCG/INH AEPB Inhale 1 puff into the lungs daily.    [DISCONTINUED] Respiratory Therapy Supplies (NEBULIZER/TUBING/MOUTHPIECE) KIT Disp one nebulizer machine, tubing set and mouthpiece kit    [DISCONTINUED] tamsulosin  (FLOMAX ) 0.4 MG CAPS capsule Take 1 capsule (0.4 mg total) by mouth at bedtime. Can lower BP, take when going to bed    No facility-administered encounter medications on file as of 09/04/2019.      Objective:   Vitals:   09/04/19 1505  BP: 132/80  Pulse: 97  Temp: 98.2 F (36.8 C)  Height: 5' 8 (1.727 m)  Weight: 212 lb (96.2 kg)  SpO2: 96%  TempSrc: Temporal  BMI (Calculated): 32.24  Physical exam documentation is limited by delayed entry of information.

## 2019-09-04 NOTE — Patient Instructions (Signed)
We are going to schedule a sleep study at [facility]  Continue Breo and as needed albuterol for now  We will schedule you for breathing test  Schedule you for an echocardiogram to check your heart  Follow-up in 4 to 6 weeks with me or the nurse practitioner

## 2019-09-15 ENCOUNTER — Telehealth: Payer: Self-pay | Admitting: Pulmonary Disease

## 2019-09-15 ENCOUNTER — Ambulatory Visit
Admission: RE | Admit: 2019-09-15 | Discharge: 2019-09-15 | Disposition: A | Discharge status: Home / Self Care | Payer: 59 | Source: Ambulatory Visit | Attending: Pulmonary Disease | Admitting: Pulmonary Disease

## 2019-09-15 ENCOUNTER — Other Ambulatory Visit: Payer: Self-pay

## 2019-09-15 DIAGNOSIS — I10 Essential (primary) hypertension: Secondary | ICD-10-CM | POA: Insufficient documentation

## 2019-09-15 DIAGNOSIS — E785 Hyperlipidemia, unspecified: Secondary | ICD-10-CM | POA: Insufficient documentation

## 2019-09-15 DIAGNOSIS — R0600 Dyspnea, unspecified: Secondary | ICD-10-CM | POA: Insufficient documentation

## 2019-09-15 NOTE — Progress Notes (Signed)
*  PRELIMINARY RESULTS* Echocardiogram 2D Echocardiogram has been performed.  Anita Weaver 09/15/2019, 10:44 AM

## 2019-09-15 NOTE — Telephone Encounter (Signed)
Anita Weaver, can you see about getting her rescheduled for her sleep study?

## 2019-09-15 NOTE — Telephone Encounter (Signed)
Spoke with patient and she stated that her primary care physician ordered sleep study. Pt stated that she did show up for study, however, they were unable to do the study due to pt having gel on her fingernails. Offered to give patient the phone number to call to R/S and pt stated that she knew the phone number but haven't called back to R/S as of yet.  Pt advised that when she was ready, she would contact them back to R/S. Nothing else needed from [place] as we did not order the study.   Spoke with Meagan at the sleep lab who stated that she was reaching out to [place] to advise [place] that Dr. Patsey Berthold had placed an order for sleep study and that they do not accept outside orders for Sleep Studies.  Meagan stated that she has cancel the order that Dr. Patsey Berthold had placed. Pt will call back to R/S study as ordered by primary care physician. Nothing else needed by our office at this time. Rhonda J Cobb

## 2019-09-15 NOTE — Telephone Encounter (Signed)
LMOVM for pt to return my call regarding Sleep Study ordered by her Neurologist. Anita Weaver

## 2019-09-15 NOTE — Telephone Encounter (Signed)
Spoke with the pt  She is asking for her ECHO results  Please advise, thanks!

## 2019-09-17 NOTE — Telephone Encounter (Signed)
Pt is aware that Dr. Patsey Berthold is currently our of the office and we are awaiting results.

## 2019-09-17 NOTE — Telephone Encounter (Signed)
Patient checking on Echo results. Patient phone number is [phone] w before 2:30 pm or [phone] c after 2:30 pm. May leave detailed message on voicemail.

## 2019-09-18 ENCOUNTER — Telehealth: Payer: Self-pay

## 2019-09-18 NOTE — Telephone Encounter (Signed)
Lm to relay date/time of covid test.  09/22/2019 prior to [facility].

## 2019-09-19 NOTE — Telephone Encounter (Signed)
Pt is aware of date/time of covid test. Nothing further is needed.  

## 2019-09-19 NOTE — Telephone Encounter (Signed)
Lm x2 for pt.  

## 2019-09-22 ENCOUNTER — Other Ambulatory Visit
Admission: RE | Admit: 2019-09-22 | Discharge: 2019-09-22 | Disposition: A | Discharge status: Home / Self Care | Payer: 59 | Source: Ambulatory Visit | Attending: Pulmonary Disease | Admitting: Pulmonary Disease

## 2019-09-22 ENCOUNTER — Other Ambulatory Visit: Payer: Self-pay

## 2019-09-22 DIAGNOSIS — Z20822 Contact with and (suspected) exposure to covid-19: Secondary | ICD-10-CM | POA: Insufficient documentation

## 2019-09-22 DIAGNOSIS — Z01812 Encounter for preprocedural laboratory examination: Secondary | ICD-10-CM | POA: Insufficient documentation

## 2019-09-22 NOTE — Telephone Encounter (Signed)
Pt is aware of results and voiced her understanding. Nothing further is needed.  

## 2019-09-22 NOTE — Telephone Encounter (Signed)
Lm for pt

## 2019-09-22 NOTE — Telephone Encounter (Signed)
Echocardiogram showed that her heart function is good.  Valves are also working well.  We will need to await the sleep study and the breathing studies.

## 2019-09-23 ENCOUNTER — Ambulatory Visit: Payer: 59 | Attending: Pulmonary Disease

## 2019-09-23 DIAGNOSIS — R0600 Dyspnea, unspecified: Secondary | ICD-10-CM | POA: Diagnosis not present

## 2019-09-23 LAB — SARS CORONAVIRUS 2 (TAT 6-24 HRS): SARS Coronavirus 2: NEGATIVE

## 2019-09-29 ENCOUNTER — Telehealth: Payer: Self-pay | Admitting: Pulmonary Disease

## 2019-09-29 NOTE — Telephone Encounter (Signed)
Called and spoke to pt, who is requesting PFT results.  Dr. Patsey Berthold please advise. Thanks

## 2019-10-01 NOTE — Telephone Encounter (Signed)
Anita Pita, MD  Anita Weaver, CMA Overall the breathing test looks good though it was somewhat difficult to interpret due to difficulties that she had during the testing. There is no fixed obstructive defect meaning that what ever issue she has is likely reversible. Her symptoms are mostly at nighttime and I had discussed with her that I would proceed with Weaver sleep study as recommended previously by [facility] neurological.   Pt is aware of results and voiced her understanding.  Nothing further is needed.

## 2019-10-01 NOTE — Telephone Encounter (Signed)
[name], MD  Anita Weaver, CMA Overall the breathing test looks good though it was somewhat difficult to interpret due to difficulties that she had during the testing. There is no fixed obstructive defect meaning that what ever issue she has is likely reversible. Her symptoms are mostly at nighttime and I had discussed with her that I would proceed with Weaver sleep study as recommended previously by [facility] neurological.   Pt is aware of results and voiced her understanding.  Nothing further is needed.

## 2019-10-02 IMAGING — CR DG SHOULDER 2+V*L*
1 series · 3 of 3 positions shown · non-contrast
Comparison: Chest radiographs 12/25/2011.

CLINICAL DATA: 43-year-old female with bilateral humerus and
shoulder pain. No known injury.

EXAM:
LEFT SHOULDER - 2+ VIEW

[Series 1: dg shoulder left · 0.14mm/px · 3 of 3 slices shown]
[im 1/3]
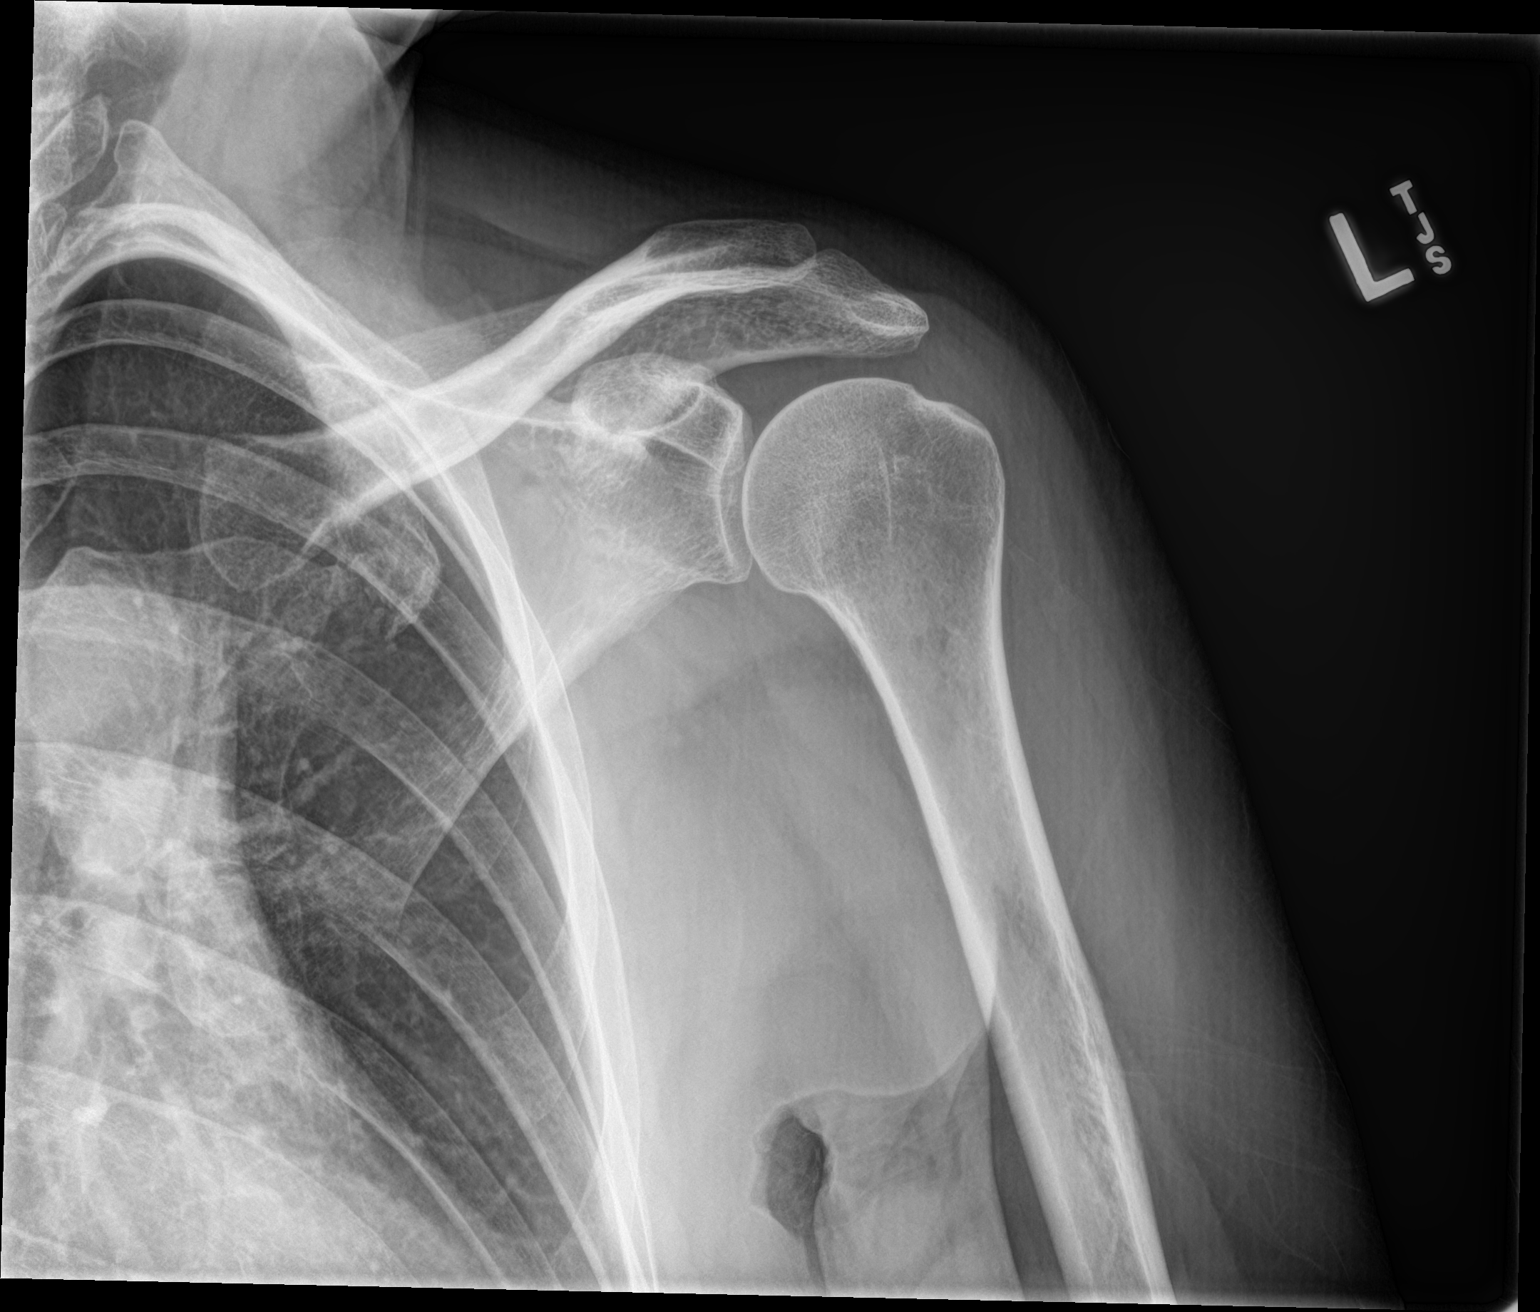
[im 2/3]
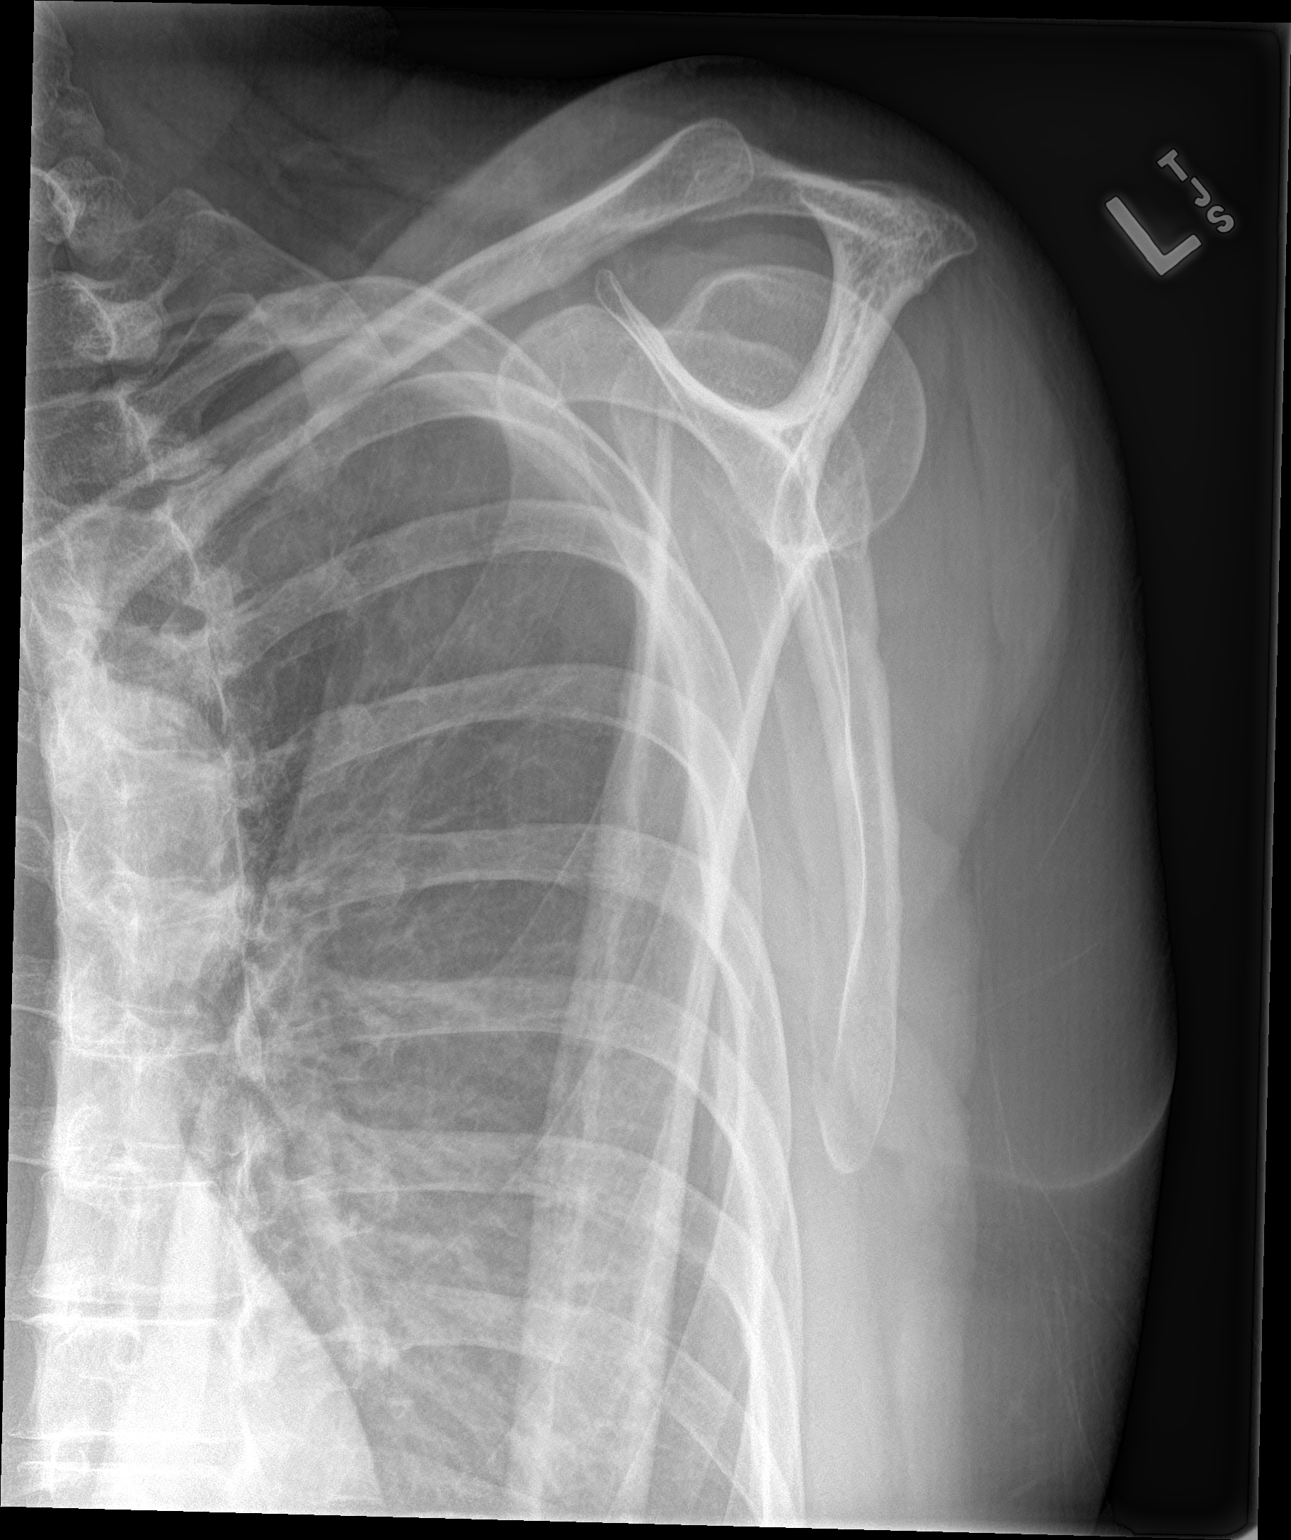
[im 3/3]
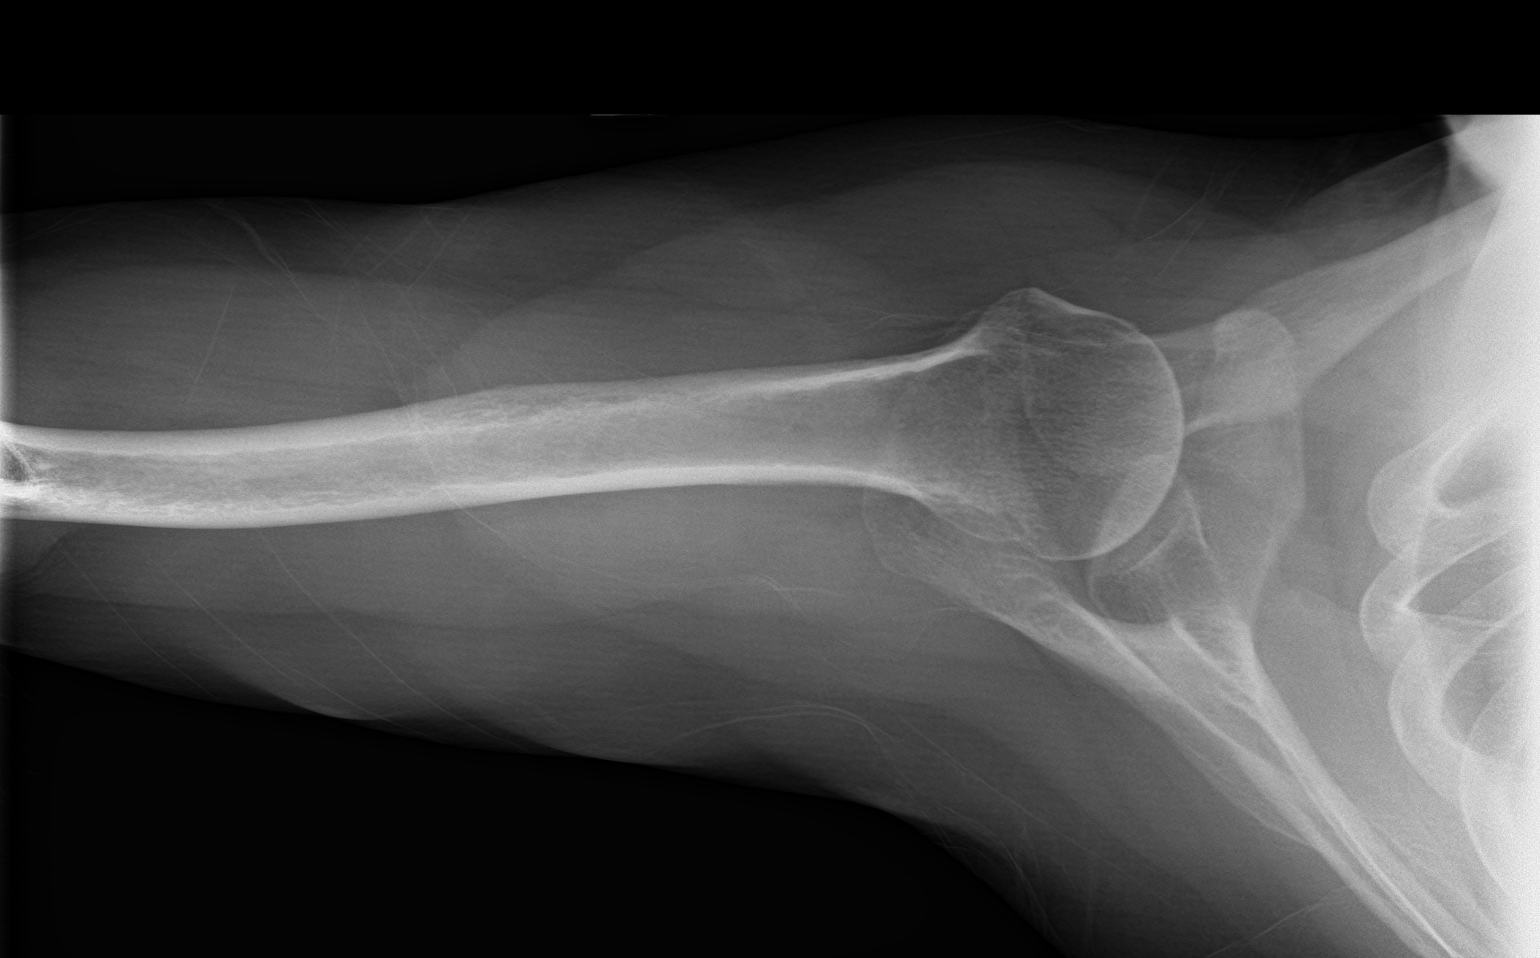

[3 of 3 positions shown; findings below may reference images not displayed]

FINDINGS: Bone mineralization is within normal limits. There is no evidence of
fracture or dislocation. The visible left humerus appears intact and
normal. There is no evidence of arthropathy or other focal bone
abnormality. Negative visible left ribs and chest.
IMPRESSION: Negative.

## 2019-10-02 IMAGING — CR DG SHOULDER 2+V*R*
1 series · 4 of 4 positions shown · non-contrast
Comparison: Chest radiographs 12/25/2011.

CLINICAL DATA: 43-year-old female with bilateral humerus and
shoulder pain. No known injury.

EXAM:
RIGHT SHOULDER - 2+ VIEW

[Series 1: dg shoulder right · 0.14mm/px · 4 of 4 slices shown]
[im 1/4]
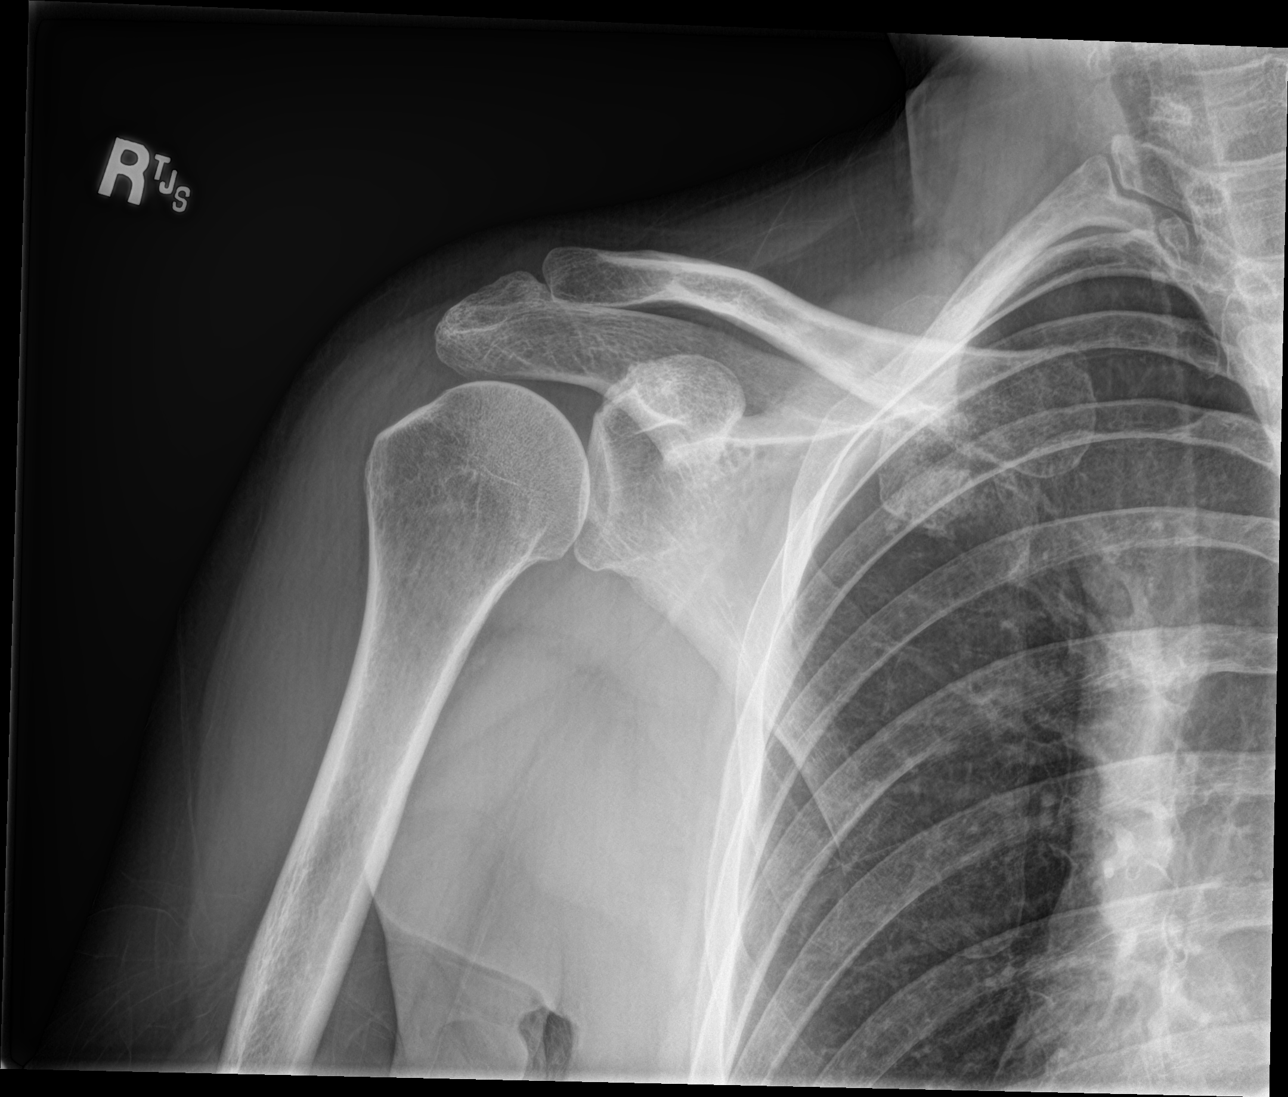
[im 2/4]
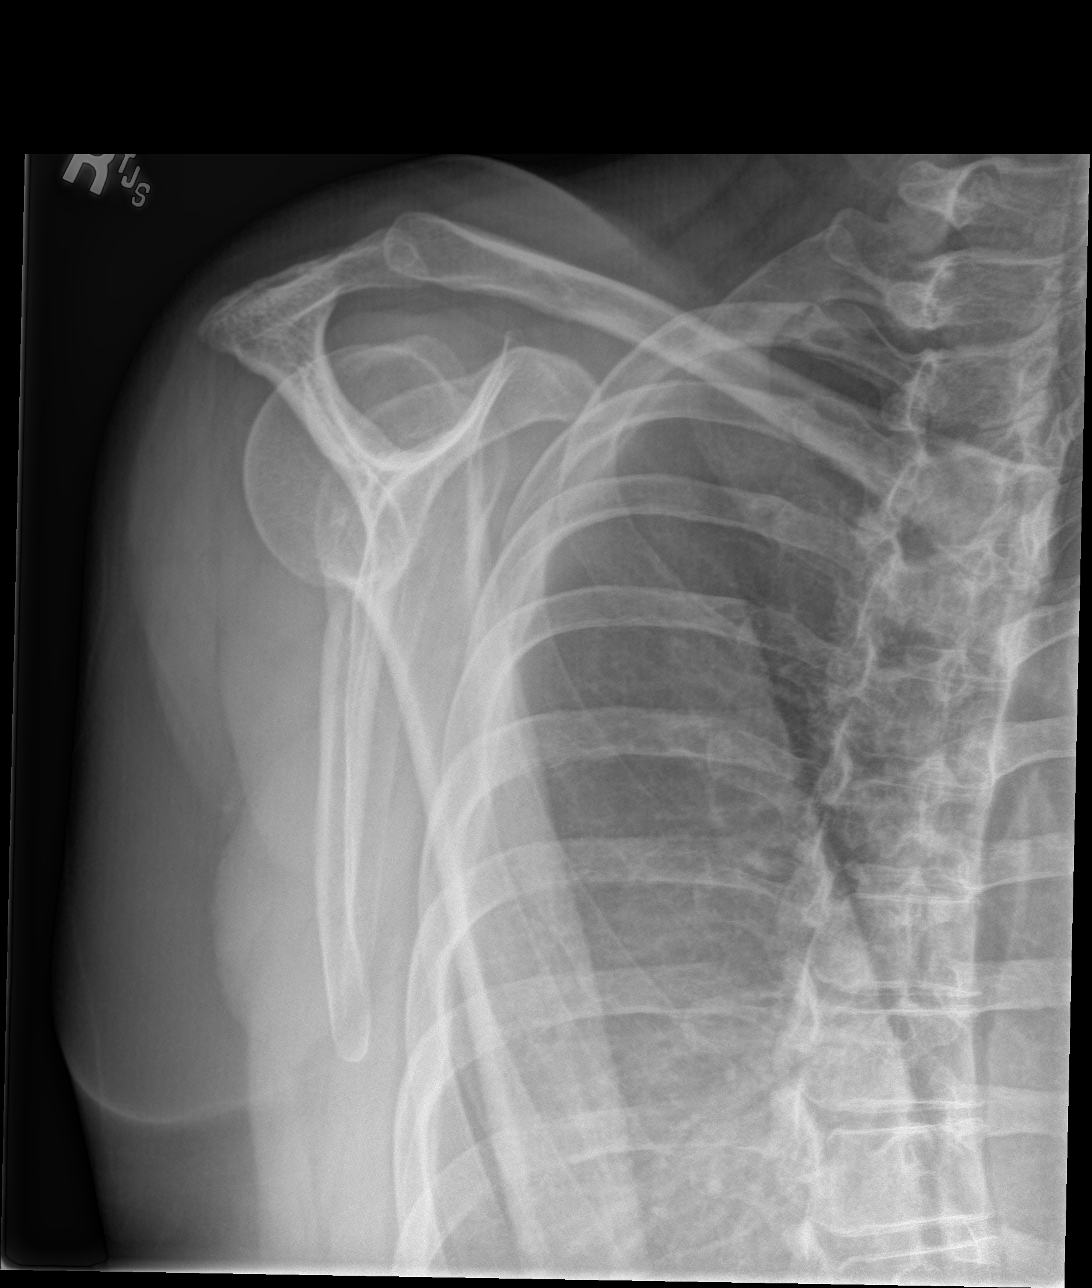
[im 3/4]
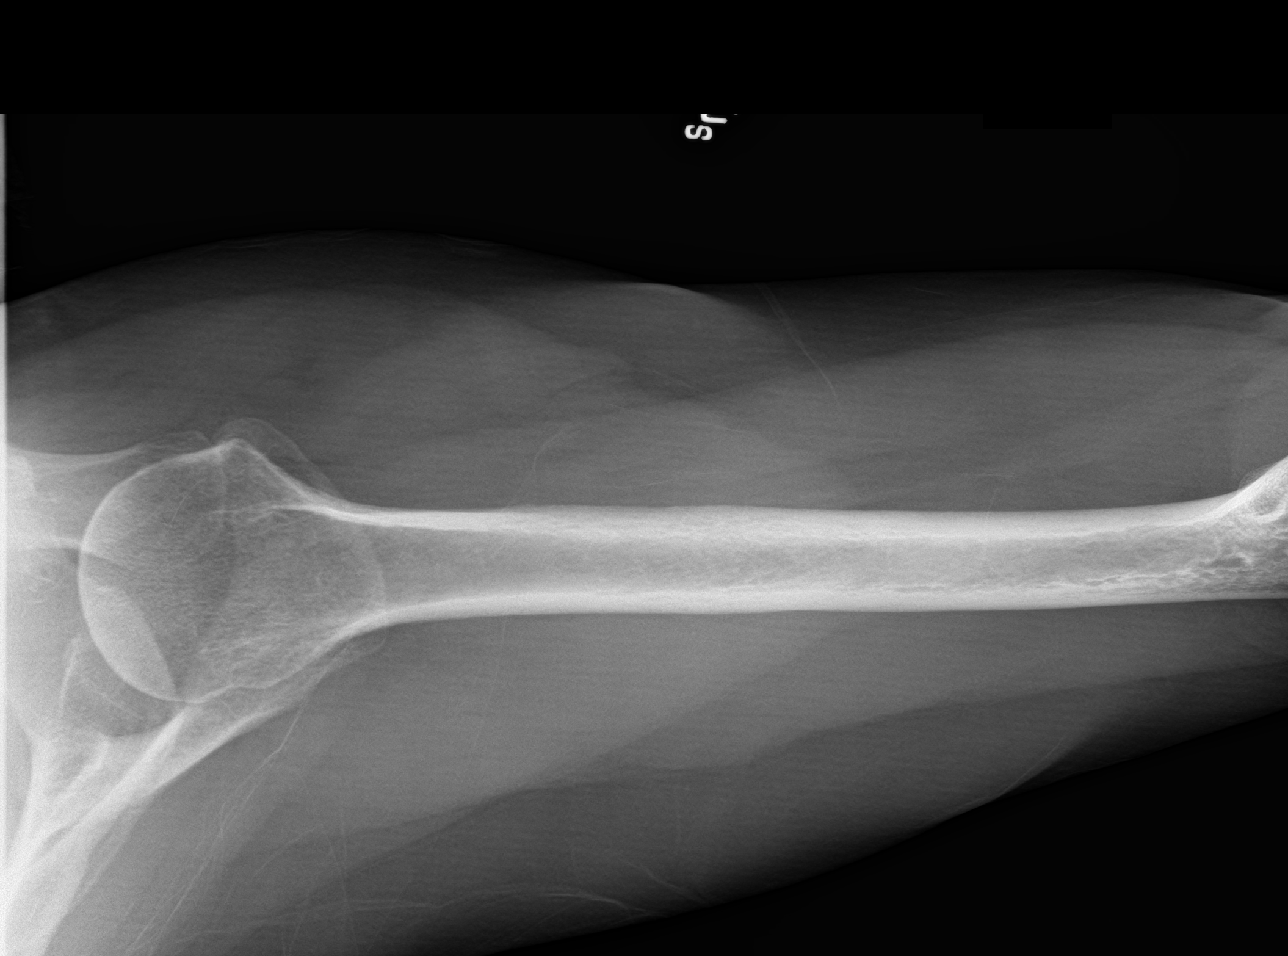
[im 4/4]
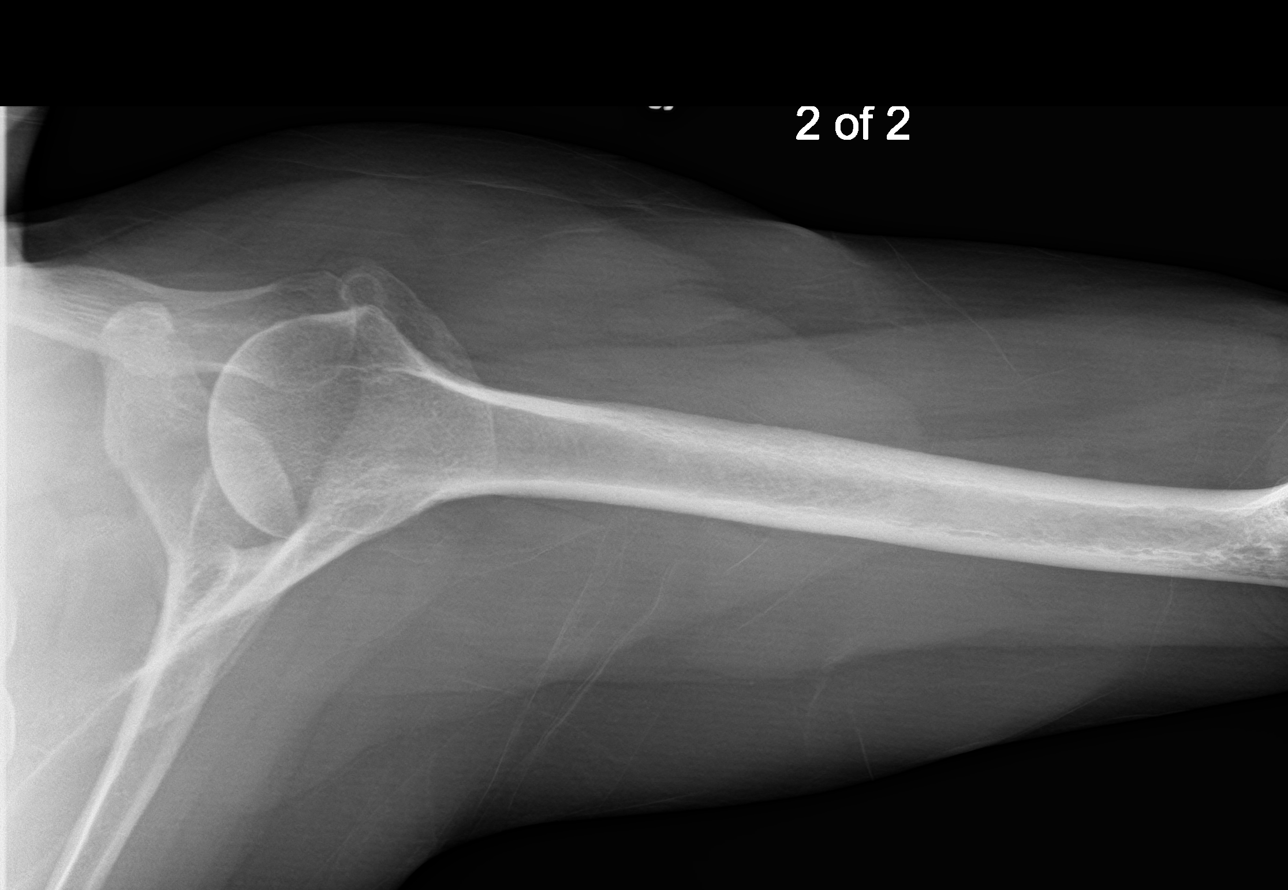

[4 of 4 positions shown; findings below may reference images not displayed]

FINDINGS: Bone mineralization is within normal limits. There is no evidence of
fracture or dislocation. Visible right humerus appears intact and
negative. There is no evidence of arthropathy or other focal bone
abnormality. Negative visible right ribs and chest.
IMPRESSION: Negative.

## 2019-10-09 ENCOUNTER — Other Ambulatory Visit: Payer: Self-pay

## 2019-10-09 ENCOUNTER — Ambulatory Visit (INDEPENDENT_AMBULATORY_CARE_PROVIDER_SITE_OTHER): Payer: 59

## 2019-10-09 VITALS — BP 143/92 | HR 72

## 2019-10-09 DIAGNOSIS — Z3042 Encounter for surveillance of injectable contraceptive: Secondary | ICD-10-CM

## 2019-10-09 MED ORDER — MEDROXYPROGESTERONE ACETATE 150 MG/ML IM SUSP
150.0000 mg | Freq: Once | INTRAMUSCULAR | Status: AC
  Administered 2019-10-09: 150 mg via INTRAMUSCULAR

## 2019-10-09 NOTE — Progress Notes (Signed)
Patient presented to the office today for her medroxyprogesterone injection.  Dose: 150 mg given in left detoid  HCG Serum: N/A  NDC# [id number]  Patient blood pressure is elevated today. 143/92. P72 Patient reports feeling fine at this time. No blurred vision,dizziness or headache at this time. She ask me to write down her blood pressure reading so that she can follow- up with her PCP. Advised her to check blood pressure later and to make her PCP  aware if its still elevated. Patient voiced understanding at this time.  Refills called into [facility].

## 2019-10-09 NOTE — Progress Notes (Signed)
Patient was assessed and managed by nursing staff during this encounter. I have reviewed the chart and agree with the documentation and plan. I have also made any necessary editorial changes.  Verita Schneiders, MD 10/09/2019 3:55 PM

## 2019-11-10 ENCOUNTER — Telehealth: Payer: Self-pay

## 2019-11-10 NOTE — Telephone Encounter (Signed)
Can you with an appt?

## 2019-11-10 NOTE — Telephone Encounter (Signed)
Copied from [place] [id number]. Topic: General - Other >> Nov 10, 2019  3:09 PM Rainey Pines A wrote: Patient wants to know if Lucio Edward would be able to prescribe muscle relaxers and medication for her migraines that she has been seen for by Dr. Brigitte Pulse. Please advise

## 2019-11-11 NOTE — Telephone Encounter (Signed)
Pt will need appointment for headache medication from another provider

## 2019-11-11 NOTE — Telephone Encounter (Signed)
Pt has been scheduled with Dr Roxan Hockey for [facility] 8/04. She is completely out of her migraine med, sumatriptan and flexeril, prescribed by another dr

## 2019-11-11 NOTE — Progress Notes (Signed)
Patient ID: Anita Weaver, female    DOB: 14-Sep-1975, 44 y.o.   MRN: [id number]  PCP: Anita Grana, PA-C  Chief Complaint  Patient presents with  . Migraine    Subjective:   Anita Weaver is a 44 y.o. female, presents to clinic with CC of the following:  Chief Complaint  Patient presents with  . Migraine    HPI:  Patient is a 44 year old female patient of Anita Weaver She follows up today to discuss migraine medicine/muscle relaxant medicine that was prescribed by another doctor. I have not seen this patient in the past, with this my first interaction with her. He has been seen by multiple specialists in 2021 including gastroenterology, pulmonary, cardiology, OB/GYN, behavioral health, and the neurology sleep clinic.  Prior video visit with neurology in October 2020 had the following assessment/plan with respect to migraine headaches.  Assessment and Plan: 1.  Chronic migraine headache -As of recent she reports daily headache, tried to refer to headache clinic, but she reports long wait time, long distance from her home -She indicates suboptimal response to increased dose of Aimovig 140 mg, we will switch to Emgality 120 mg monthly injection to see if better benefit -Continue Topamax 100 mg twice a day (we may discontinue this in the future, she questions if benefit) -She did not start propanolol LA 80 mg daily after last visit due to cost, may consider retry this at next visit, try propanolol 40 mg twice a day -Continue Effexor 75 mg daily -Continue Maxalt, Zofran, tizanidine as needed (asking for 63-month supply of Maxalt to be sent to [facility] because she pays cash, I do not see any record 3 months being sent, need to check with Dr. Rhea Belton nurse) -She likely has a component of medicine rebound headache with her frequent triptan use, should only treat moderate to severe headaches 2-3 times a week -I have encouraged her to consider Botox as an option, as we have exhausted  several treatment options -Return in 3 months or sooner if needed  Follow Up Instructions: 3 months 05/08/2019 3:15  Insurance did not approve her Emgality and that was not started initially, then she did take for 2 months. She inquired about what else could be called in for her, with the following neurology message located 02/03/2019  The patient would like to know if she can take tizanidine at bedtime.  She is having difficulty sleeping with her migraine.  Per vo by Dr. Krista Blue, she can take her tizanidine and also add OTC melatonin at bedtime.  Dr. Krista Blue has also offered to increase her venlafaxine ER from 75mg  daily to 150mg  daily.  The patient is agreeable to the increase.  States she has a pending appt with Dr. Domingo Cocking, at the [facility], on 03/11/2019.  I can find no records from a headache wellness clinic visit in December 2020,, although she noted she did see them, and the doctor there stopped all the medicine she was taking except for a muscle relaxant, Flexeril.  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  Last visit was 3 months ago, and she does not plan to go back as they have not been helpful for her. I also could not find any documentation of a follow-up neurology visit that was planned in January 2021, although patient noted the neurologist  did not want her to f/u as she had no more recommendations to help, and was referred to [facility].   All in all, she notes that her HA's have improved since stopping the many medications that she was on, and that her headaches still recur in cycles.  She can go a week without a headache, and then may get 3 the next week.  Sometimes she can get them for days in a row.  The medicine that helps her with headaches presently is the rizatriptan, and she has to pay cash for this and she gets it at [facility].   She has 1 pill left which she used today.  She has not ever taken a second pill after she takes the first. It was noted she has an allergy to Imitrex, although states that was in the distant past and when she took that, she did not feel well, had some hot flashes, felt fatigued, and has not had the same problem with the rizatriptan.  Her main goal today was to get more rizatriptan to help control her headache symptoms.  She was not anxious to get another opinion from a different neurologist as she notes she is tired of feeling like a [place] pig, and not anxious to try a lot more medicines.  I did note that there are some possibilities of management that do not involve medicines that may be considered, and considering a referral for a second opinion from someone in the [facility] or [facility] system may be a good option.  Tobacco-never smoker  She also has a history of bipolar disorder, with her last visit with behavioral health on 06/18/2019, details of that visit limited to view.  She notes she still sees them, are helpful, and has had no increased concerns in the recent past.  Patient Active Problem List   Diagnosis Date Noted  . Arthritis 07/09/2018  . Chronic interstitial cystitis 07/09/2018  . Irritable bowel syndrome 07/09/2018  . Scoliosis deformity of spine 07/09/2018  . Asthma 04/27/2017  . Acid reflux 04/27/2017  . Hiatal hernia with GERD 04/27/2017  . Contact dermatitis 05/15/2016  . Fatigue 01/20/2016  . Mild dysplasia of cervix (CIN I) 09/29/2014  . Pap smear abnormality of cervix with LGSIL 09/21/2014  . FH: genetic disease carrier 04/22/2014  . Migraine headache 05/08/2013  . Bipolar disorder, unspecified ([place]) 05/08/2013  . Endometriosis 04/18/2012  . Chronic pelvic pain in female 04/18/2012  . Female genital symptoms 04/18/2012      Current Outpatient Medications:  .  albuterol (PROAIR HFA) 108 (90 Base) MCG/ACT inhaler, Inhale 2 puffs into the lungs every 4 (four) hours as  needed for wheezing or shortness of breath., Disp: 18 g, Rfl: 2 .  cyclobenzaprine (FLEXERIL) 10 MG tablet, Take 10 mg by mouth 2 (two) times daily as needed. , Disp: , Rfl:  .  LATUDA 40 MG TABS tablet, TAKE 1 TABLET (40 MG TOTAL) BY MOUTH DAILY WITH BREAKFAST., Disp: 30 tablet, Rfl: 5 .  medroxyPROGESTERone (DEPO-PROVERA) 150 MG/ML injection, Inject 1 mL (150 mg total) into the muscle every 3 (three) months., Disp: 1 mL, Rfl: 3 .  ondansetron (ZOFRAN) 4 MG tablet, Take one tablet every 8 hours as needed. #10 to last 30 days. For migraines., Disp: 10 tablet, Rfl: 5 .  pantoprazole (PROTONIX) 40 MG tablet, Take 1 tablet (40 mg total) by mouth daily., Disp: 60 tablet, Rfl: 11 .  rizatriptan (MAXALT) 10 MG tablet, Take 10 mg by mouth as needed  for migraine. May repeat in 2 hours if needed, Disp: , Rfl:  .  tiZANidine (ZANAFLEX) 4 MG tablet, Take 1 tablet (4 mg total) by mouth every 6 (six) hours as needed for muscle spasms., Disp: 30 tablet, Rfl: 3   Allergies  Allergen Reactions  . Imitrex [Sumatriptan] Other (See Comments)    Heart palpitations, hot/cold sweats  . Demerol [Meperidine] Other (See Comments)    hallucinations  . Flagyl [Metronidazole] Hives     Past Surgical History:  Procedure Laterality Date  . BREAST BIOPSY Left 02/08/2017  . CHOLECYSTECTOMY    . LAPAROSCOPY  2006  . TUBAL LIGATION       Family History  Problem Relation Age of Onset  . Cancer Mother 77       breast  . Breast cancer Mother 54  . Cancer Sister 97  . Breast cancer Sister 60  . Kidney disease Son   . Heart attack Maternal Grandfather   . Heart attack Paternal Grandmother   . Heart attack Paternal Grandfather      Social History   Tobacco Use  . Smoking status: Never Smoker  . Smokeless tobacco: Never Used  Substance Use Topics  . Alcohol use: Yes    Alcohol/week: 0.0 standard drinks    Comment: occasionally    With staff assistance, above reviewed with the patient today.  ROS: As  per HPI, otherwise no specific complaints on a limited and focused system review   No results found for this or any previous visit (from the past 72 hour(s)).   PHQ2/9: Depression screen [facility] 2/9 11/12/2019 08/05/2019 07/08/2019 05/12/2019 04/17/2019  Decreased Interest 0 0 0 0 0  Down, Depressed, Hopeless 0 0 0 0 0  PHQ - 2 Score 0 0 0 0 0  Altered sleeping 0 0 0 0 0  Tired, decreased energy 0 0 0 0 0  Change in appetite 0 0 0 0 0  Feeling bad or failure about yourself  0 0 0 0 0  Trouble concentrating 0 0 0 0 0  Moving slowly or fidgety/restless 0 0 0 0 0  Suicidal thoughts 0 0 0 0 0  PHQ-9 Score 0 0 0 0 0  Difficult doing work/chores Not difficult at all Not difficult at all Not difficult at all Not difficult at all -   PHQ-2/9 Result is neg  Fall Risk: Fall Risk  11/12/2019 08/05/2019 07/08/2019 05/12/2019 04/17/2019  Falls in the past year? 0 0 0 0 0  Number falls in past yr: 0 0 0 0 0  Injury with Fall? 0 0 0 0 0      Objective:   Vitals:   11/12/19 1457  BP: 130/80  Pulse: (!) 102  Resp: 16  Temp: 97.9 F (36.6 C)  TempSrc: Temporal  SpO2: 99%  Weight: 213 lb 9.6 oz (96.9 kg)  Height: 5\' 8"  (1.727 m)    Body mass index is 32.48 kg/m.  Physical Exam   NAD, masked, Neuro/psychiatric - affect was not flat, appropriate with conversation  Alert  Speech normal  Further exam limited today, I spent approximately 25 minutes with history and consultation.  Results for orders placed or performed during the hospital encounter of 09/22/19  SARS CORONAVIRUS 2 (TAT 6-24 HRS) Nasopharyngeal Nasopharyngeal Swab   Specimen: Nasopharyngeal Swab  Result Value Ref Range   SARS Coronavirus 2 NEGATIVE NEGATIVE       Assessment & Plan:   1. Migraine without status migrainosus, not intractable, unspecified  migraine type All in all, she notes she has been feeling better in the recent past after stopping all the medications except for the Flexeril product, although could not take that  much during the daytime hours due to it causing sedation.  She notes the only entity that helps her headaches presently is the rizatriptan, and needs to continue that when she gets her headaches. She has tried many other medicines as noted above, and as we discussed, I would need to defer to neurologists to help with further management over time.  If the rizatriptan is not helping over time, or if her headaches are increasing again and she is using it very frequently, she is aware I will need to refer to a neurologist to get further input. I did strongly encourage her to consider a second opinion or seeing a neurologist in the [facility] or [facility] system, as she is not returning to the one at [facility] neurology through [place].  Do feel there may be options to try her recommendations they have that may be helpful in lessening headaches over time which has to be our goal.  She was understanding of that. Did refill the rizatriptan for her today to help in the short-term with her symptoms.  - rizatriptan (MAXALT) 10 MG tablet; Take 1 tablet (10 mg total) by mouth as needed for migraine. May repeat in 2 hours if needed  Dispense: 10 tablet; Refill: 3        Requan Hardge D Laycie Schriner, MD 11/12/19 3:03 PM

## 2019-11-12 ENCOUNTER — Ambulatory Visit: Payer: 59 | Admitting: Internal Medicine

## 2019-11-12 ENCOUNTER — Encounter: Payer: Self-pay | Admitting: Internal Medicine

## 2019-11-12 ENCOUNTER — Other Ambulatory Visit: Payer: Self-pay

## 2019-11-12 ENCOUNTER — Telehealth: Payer: Self-pay | Admitting: Neurology

## 2019-11-12 VITALS — BP 130/80 | HR 102 | Temp 97.9°F | Resp 16 | Ht 68.0 in | Wt 213.6 lb

## 2019-11-12 DIAGNOSIS — G43909 Migraine, unspecified, not intractable, without status migrainosus: Secondary | ICD-10-CM | POA: Diagnosis not present

## 2019-11-12 MED ORDER — RIZATRIPTAN BENZOATE 10 MG PO TABS: 10.0000 mg | ORAL_TABLET | ORAL | 3 refills | Status: AC | PRN

## 2019-11-12 NOTE — Telephone Encounter (Signed)
Pt called stating that she no longer wants to continue to go to where she was referred to for her Migraines and she is wanting to know how she can become a Dr. Krista Blue pt again. Please advise.

## 2019-11-12 NOTE — Telephone Encounter (Signed)
The patient was referred to Dr. Domingo Cocking at [facility] in [place].

## 2019-11-13 ENCOUNTER — Telehealth: Payer: Self-pay

## 2019-11-13 DIAGNOSIS — G43909 Migraine, unspecified, not intractable, without status migrainosus: Secondary | ICD-10-CM

## 2019-11-13 NOTE — Telephone Encounter (Signed)
I spoke further with Dr. Krista Blue. Rather than returning here, she is recommending the headache clinic at [facility] if she wishes to change from the [facility] here in [place].   I called the patient and left a detailed message (ok per DPR). I informed her of the other programs and encouraged her to do some research on both locations. Since she is no longer being seen here and is now an established patient w/ Dr. Domingo Cocking, she will need to request this referral from his office. This way the new provider will have her most up-to-date records.

## 2019-11-13 NOTE — Telephone Encounter (Signed)
Copied from [place] [id number]. Topic: Referral - Request for Referral >> Nov 13, 2019  3:19 PM Erick Blinks wrote: Has patient seen PCP for this complaint? Yes.   *If NO, is insurance requiring patient see PCP for this issue before PCP can refer them? Referral for which specialty: Neurology  Preferred provider/office: Highest recommended in [facility]  Reason for referral: PCP suggestion

## 2019-11-14 NOTE — Telephone Encounter (Signed)
Left detailed vm °

## 2019-11-14 NOTE — Telephone Encounter (Signed)
Patient states that it is for her recurrent migraines.

## 2019-11-14 NOTE — Addendum Note (Signed)
Addended by: [name] on: 11/14/2019 05:11 PM   Modules accepted: Orders

## 2019-11-26 ENCOUNTER — Ambulatory Visit (INDEPENDENT_AMBULATORY_CARE_PROVIDER_SITE_OTHER): Payer: 59 | Admitting: Adult Health

## 2019-11-26 ENCOUNTER — Other Ambulatory Visit: Payer: Self-pay

## 2019-11-26 ENCOUNTER — Encounter: Payer: Self-pay | Admitting: Adult Health

## 2019-11-26 DIAGNOSIS — G4700 Insomnia, unspecified: Secondary | ICD-10-CM

## 2019-11-26 DIAGNOSIS — F3130 Bipolar disorder, current episode depressed, mild or moderate severity, unspecified: Secondary | ICD-10-CM

## 2019-11-26 MED ORDER — LURASIDONE HCL 40 MG PO TABS: ORAL_TABLET | ORAL | 5 refills | Status: DC

## 2019-11-26 NOTE — Progress Notes (Signed)
Anita Weaver [id number] March 01, 1976 44 y.o.  Subjective:   Patient ID:  Anita Weaver is a 44 y.o. (DOB March 12, 1976) female.  Chief Complaint: No chief complaint on file.   HPI Anita Weaver presents to the office today for follow-up of BPD 1 and insomnia.  Describes mood today as "ok". Pleasant. Mood symptoms - denies depression, anxiety, and irritability. Stating "I'm doing great". Continues to do well with Latuda $RemoveBef'40mg'TQvvMYqxOv$  daily. Stable interest and motivation. Taking medications as prescribed.  Energy levels "better some days than others". Active, does not have a regular exercise routine.  Enjoys some usual interests and activities. Married. Lives with husband. Has 3 grown sons. Parents live in [place].  Appetite adequate. Weight stable - 214 pounds - $Remove'5\' 8"'qGihUTj$ . Sleeps well most nights. Averages 10 hours. Focus and concentration stable. Completing tasks. Managing aspects of household. 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  Denies SI or HI. Denies AH or VH.    PHQ2-9     Office Visit from 11/12/2019 in [facility] Office Visit from 08/05/2019 in [facility] Office Visit from 07/08/2019 in [facility] Office Visit from 05/12/2019 in [facility] Office Visit from 04/17/2019 in [facility]  PHQ-2 Total Score 0 0 0 0 0  PHQ-9 Total Score 0 0 0 0 0       Review of Systems:  Review of Systems  Musculoskeletal: Negative for gait problem.  Neurological: Negative for tremors.  Psychiatric/Behavioral:       Please refer to HPI    Medications: I have reviewed the patient's current medications.  Current Outpatient Medications  Medication Sig Dispense Refill  . albuterol (PROAIR HFA) 108 (90 Base) MCG/ACT inhaler Inhale 2 puffs into the lungs every 4 (four) hours as needed for wheezing or shortness of breath. 18 g 2  . cyclobenzaprine (FLEXERIL) 10 MG tablet Take 10 mg by mouth 2 (two) times  daily as needed.     . lurasidone (LATUDA) 40 MG TABS tablet TAKE 1 TABLET (40 MG TOTAL) BY MOUTH DAILY WITH BREAKFAST. 30 tablet 5  . medroxyPROGESTERone (DEPO-PROVERA) 150 MG/ML injection Inject 1 mL (150 mg total) into the muscle every 3 (three) months. 1 mL 3  . ondansetron (ZOFRAN) 4 MG tablet Take one tablet every 8 hours as needed. #10 to last 30 days. For migraines. 10 tablet 5  . pantoprazole (PROTONIX) 40 MG tablet Take 1 tablet (40 mg total) by mouth daily. 60 tablet 11  . rizatriptan (MAXALT) 10 MG tablet Take 1 tablet (10 mg total) by mouth as needed for migraine. May repeat in 2 hours if needed 10 tablet 3  . tiZANidine (ZANAFLEX) 4 MG tablet Take 1 tablet (4 mg total) by mouth every 6 (six) hours as needed for muscle spasms. 30 tablet 3   No current facility-administered medications for this visit.    Medication Side Effects: None  Allergies:  Allergies  Allergen Reactions  . Imitrex [Sumatriptan] Other (See Comments)    Heart palpitations, hot/cold sweats  . Demerol [Meperidine] Other (See Comments)    hallucinations  . Flagyl [Metronidazole] Hives    Past Medical History:  Diagnosis Date  . Allergy   . Arthritis   . Asthma   . Chronic pelvic pain in female   . Endometriosis   . FH: breast cancer in first degree relative    Mother and sister both are BRCA positive; patient  is BRCA negative  . GERD (gastroesophageal reflux disease)   . Headache    migraines  . Heart murmur   . Hyperlipidemia   . Hypertension   . Interstitial cystitis   . Irritable bowel disease   . Neck pain     Family History  Problem Relation Age of Onset  . Cancer Mother 71       breast  . Breast cancer Mother 43  . Cancer Sister 51  . Breast cancer Sister 37  . Kidney disease Son   . Heart attack Maternal Grandfather   . Heart attack Paternal Grandmother   . Heart attack Paternal Grandfather     Social History   Socioeconomic History  . Marital status: Married    Spouse  name: [name]  . Number of children: 3  . Years of education: 40  . 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 Smoking status: Never Smoker  . Smokeless tobacco: Never Used  Vaping Use  . Vaping Use: Never used  Substance and Sexual Activity  . Alcohol use: Yes    Alcohol/week: 0.0 standard drinks    Comment: occasionally  . Drug use: No  . Sexual activity: Yes    Partners: Male    Birth control/protection: Surgical    Comment: tubaligation  Other Topics Concern  . Not on file  Social History Narrative   ** Merged History Encounter **       ** Data from: 02/09/14 Enc Dept: [facility]       ** Data from: 11/10/13 Enc Dept: [facility]   Patient lives at home with [name]   Patient works full time.   Right handed.   Caffeine- none   Education- high school   Social Determinants of Health   Financial Resource Strain:   . Difficulty of Paying Living Expenses:   Food Insecurity:   . Worried About Charity fundraiser in the Last Year:   . Arboriculturist in the Last Year:   Transportation Needs:   . Film/video editor (Medical):   Marland Kitchen Lack of Transportation (Non-Medical):   Physical Activity:   . Days of Exercise per Week:   . Minutes of Exercise per Session:   Stress:   . Feeling of Stress :   Social Connections:   . Frequency of Communication with Friends and Family:   . Frequency of Social Gatherings with Friends and Family:   . Attends Religious Services:   . Active Member of Clubs or Organizations:   . Attends Archivist Meetings:   Marland Kitchen Marital Status:   Intimate Partner Violence:   . Fear of Current or Ex-Partner:   . Emotionally Abused:   Marland Kitchen Physically Abused:   . Sexually Abused:     Past Medical History, Surgical history, Social history, and Family history were reviewed and updated as appropriate.   Please see review of systems for further details on the patient's  review from today.   Objective:   Physical Exam:  There were no vitals taken for this visit.  Physical Exam Constitutional:      General: She is not in acute distress. Musculoskeletal:        General: No deformity.  Neurological:     Mental Status: She is alert and oriented to person, place, and time.     Coordination: Coordination normal.  Psychiatric:  Attention and Perception: Attention and perception normal. She does not perceive auditory or visual hallucinations.        Mood and Affect: Mood normal. Mood is not anxious or depressed. Affect is not labile, blunt, angry or inappropriate.        Speech: Speech normal.        Behavior: Behavior normal.        Thought Content: Thought content normal. Thought content is not paranoid or delusional. Thought content does not include homicidal or suicidal ideation. Thought content does not include homicidal or suicidal plan.        Cognition and Memory: Cognition and memory normal.        Judgment: Judgment normal.     Comments: Insight intact     Lab Review:     Component Value Date/Time   NA 138 05/12/2019 1620   NA 139 03/03/2013 1545   K 3.7 05/12/2019 1620   K 3.7 03/03/2013 1545   CL 103 05/12/2019 1620   CL 105 03/03/2013 1545   CO2 23 05/12/2019 1620   CO2 28 03/03/2013 1545   GLUCOSE 88 05/12/2019 1620   GLUCOSE 106 (H) 03/03/2013 1545   BUN 13 05/12/2019 1620   BUN 11 03/03/2013 1545   CREATININE 1.07 05/12/2019 1620   CALCIUM 9.5 05/12/2019 1620   CALCIUM 8.8 03/03/2013 1545   PROT 7.3 05/12/2019 1620   PROT 7.3 03/03/2013 1545   ALBUMIN 3.8 03/03/2013 1545   AST 13 05/12/2019 1620   AST 8 (L) 03/03/2013 1545   ALT 18 05/12/2019 1620   ALT 15 03/03/2013 1545   ALKPHOS 41 (L) 03/03/2013 1545   BILITOT 0.5 05/12/2019 1620   BILITOT 0.4 03/03/2013 1545   GFRNONAA 64 05/12/2019 1620   GFRAA 74 05/12/2019 1620       Component Value Date/Time   WBC 8.2 05/20/2019 1455   RBC 4.96 05/20/2019 1455    HGB 15.2 05/20/2019 1455   HGB 13.8 03/03/2013 1545   HCT 44.1 05/20/2019 1455   HCT 40.2 03/03/2013 1545   PLT 351 05/20/2019 1455   PLT 288 03/03/2013 1545   MCV 88.9 05/20/2019 1455   MCV 85 03/03/2013 1545   MCH 30.6 05/20/2019 1455   MCHC 34.5 05/20/2019 1455   RDW 12.4 05/20/2019 1455   RDW 13.3 03/03/2013 1545   LYMPHSABS 3,501 05/20/2019 1455   LYMPHSABS 2.9 03/03/2013 1545   MONOABS 752 01/20/2016 1603   MONOABS 0.6 03/03/2013 1545   EOSABS 238 05/20/2019 1455   EOSABS 0.2 03/03/2013 1545   BASOSABS 49 05/20/2019 1455   BASOSABS 0.0 03/03/2013 1545    No results found for: POCLITH, LITHIUM   No results found for: PHENYTOIN, PHENOBARB, VALPROATE, CBMZ   .res Assessment: Plan:    Plan:  1. Latuda $RemoveB'40mg'NdLolbMp$  daily  Read and reviewed note with patient for accuracy.   RTC 6 months  Patient advised to contact office with any questions, adverse effects, or acute worsening in signs and symptoms.  Discussed potential metabolic side effects associated with atypical antipsychotics, as well as potential risk for movement side effects. Advised pt to contact office if movement side effects occur.      Diagnoses and all orders for this visit:  Bipolar I disorder, most recent episode depressed (HCC) -     lurasidone (LATUDA) 40 MG TABS tablet; TAKE 1 TABLET (40 MG TOTAL) BY MOUTH DAILY WITH BREAKFAST.  Insomnia, unspecified type -     lurasidone (LATUDA) 40 MG TABS  tablet; TAKE 1 TABLET (40 MG TOTAL) BY MOUTH DAILY WITH BREAKFAST.     Please see After Visit Summary for patient specific instructions.  Future Appointments  Date Time Provider [place]  12/25/2019  3:00 PM [facility] NURSE [facility]    No orders of the defined types were placed in this encounter.   -------------------------------

## 2019-12-25 ENCOUNTER — Ambulatory Visit: Payer: 59

## 2019-12-25 ENCOUNTER — Other Ambulatory Visit: Payer: Self-pay

## 2019-12-25 ENCOUNTER — Ambulatory Visit (INDEPENDENT_AMBULATORY_CARE_PROVIDER_SITE_OTHER): Payer: 59 | Admitting: *Deleted

## 2019-12-25 DIAGNOSIS — Z3042 Encounter for surveillance of injectable contraceptive: Secondary | ICD-10-CM | POA: Diagnosis not present

## 2019-12-25 MED ORDER — MEDROXYPROGESTERONE ACETATE 150 MG/ML IM SUSP
150.0000 mg | Freq: Once | INTRAMUSCULAR | Status: AC
  Administered 2019-12-25: 150 mg via INTRAMUSCULAR

## 2019-12-25 NOTE — Progress Notes (Signed)
Date last pap: Unknown-requesting records form OBGYN office  Last Depo-Provera: 10/09/2019. Side Effects if any: none. Serum HCG indicated? NA. Depo-Provera 150 mg IM given by: Crosby Oyster, RN Next appointment due Dec 2-16th

## 2019-12-25 NOTE — Progress Notes (Signed)
Patient was assessed and managed by nursing staff during this encounter. I have reviewed the chart and agree with the documentation and plan. I have also made any necessary editorial changes.  Aletha Halim, MD 12/25/2019 10:57 PM

## 2019-12-29 ENCOUNTER — Ambulatory Visit: Payer: Self-pay

## 2019-12-29 ENCOUNTER — Telehealth: Payer: Self-pay | Admitting: Family Medicine

## 2019-12-29 NOTE — Telephone Encounter (Signed)
Sorry  Pt already scheduled.  She will have to have appt first.

## 2019-12-29 NOTE — Telephone Encounter (Signed)
Pt will need virtual for any prescriptions meds

## 2019-12-29 NOTE — Telephone Encounter (Signed)
Pt has covid and is dealing with a severe cough/ Pt scheduled virtual visit for tomorrow but would like to know if something for the cough can please be called in today/please advise

## 2019-12-29 NOTE — Telephone Encounter (Signed)
Diagnosed with COVID 19. Symptoms started last Tuesday. Non-productive cough. "I cough day and night." No fever. Has headache, body aches, loss of appetite. Has a virtual visit for tomorrow. Asking for cough medication be sent to pharmacy. Please advise pt.  Answer Assessment - Initial Assessment Questions 1. COVID-19 DIAGNOSIS: "Who made your Coronavirus (COVID-19) diagnosis?" "Was it confirmed by a positive lab test?" If not diagnosed by a HCP, ask "Are there lots of cases (community spread) where you live?" (See public health department website, if unsure)     Yes 2. COVID-19 EXPOSURE: "Was there any known exposure to [place] before the symptoms began?" CDC Definition of close contact: within 6 feet (2 meters) for a total of 15 minutes or more over a 24-hour period.      Yes 3. ONSET: "When did the COVID-19 symptoms start?"      Tuesday 4. WORST SYMPTOM: "What is your worst symptom?" (e.g., cough, fever, shortness of breath, muscle aches)     Cough 5. COUGH: "Do you have a cough?" If Yes, ask: "How bad is the cough?"       Yes 6. FEVER: "Do you have a fever?" If Yes, ask: "What is your temperature, how was it measured, and when did it start?"     No 7. RESPIRATORY STATUS: "Describe your breathing?" (e.g., shortness of breath, wheezing, unable to speak)      NO 8. BETTER-SAME-WORSE: "Are you getting better, staying the same or getting worse compared to yesterday?"  If getting worse, ask, "In what way?"     Better 9. HIGH RISK DISEASE: "Do you have any chronic medical problems?" (e.g., asthma, heart or lung disease, weak immune system, obesity, etc.)     Asthma 10. PREGNANCY: "Is there any chance you are pregnant?" "When was your last menstrual period?"       No 11. OTHER SYMPTOMS: "Do you have any other symptoms?"  (e.g., chills, fatigue, headache, loss of smell or taste, muscle pain, sore throat; new loss of smell or taste especially support the diagnosis of COVID-19)       Body aches and  headache, decreased appetite  Protocols used: CORONAVIRUS (COVID-19) DIAGNOSED OR SUSPECTED-A-AH

## 2019-12-30 ENCOUNTER — Encounter: Payer: Self-pay | Admitting: Internal Medicine

## 2019-12-30 ENCOUNTER — Telehealth (INDEPENDENT_AMBULATORY_CARE_PROVIDER_SITE_OTHER): Payer: 59 | Admitting: Internal Medicine

## 2019-12-30 DIAGNOSIS — U071 COVID-19: Secondary | ICD-10-CM | POA: Insufficient documentation

## 2019-12-30 DIAGNOSIS — J4521 Mild intermittent asthma with (acute) exacerbation: Secondary | ICD-10-CM

## 2019-12-30 DIAGNOSIS — R05 Cough: Secondary | ICD-10-CM

## 2019-12-30 DIAGNOSIS — R059 Cough, unspecified: Secondary | ICD-10-CM

## 2019-12-30 MED ORDER — BENZONATATE 100 MG PO CAPS: 100.0000 mg | ORAL_CAPSULE | Freq: Three times a day (TID) | ORAL | 1 refills | Status: DC | PRN

## 2019-12-30 NOTE — Progress Notes (Signed)
Name: Anita Weaver   MRN: [id number]    DOB: [birth year]   Date:12/30/2019       Progress Note  Subjective  Chief Complaint  Chief Complaint  Patient presents with  . Covid Positive    Results positive 12/29/19  . Cough    Symptoms began last Tuesday 12/23/19  . Chest Pain  . Nausea  . Vomiting  . Headache    I connected with  Anita Weaver on 12/30/19 at  2:40 PM EDT by telephone and verified that I am speaking with the correct person using two identifiers.  I discussed the limitations, risks, security and privacy concerns of performing an evaluation and management service by telephone and the availability of in person appointments. The patient expressed understanding and agreed to proceed. Staff also discussed with the patient that there may be a patient responsible charge related to this service. Patient Location: Home Provider Location: [facility] Additional Individuals present: none  HPI Patient is a 44 year old patient of Anita Weaver Patient with Covid, test result was positive on 12/29/2019 Sx's started on  12/23/19 Follows up by phone today.  Notes some symptoms that persist include: + cough, min production - yellow No marked SOB, no wheeze + fever, went away several days ago  + mild sore throat and congestion + loss of smell, loss of taste + N/V, last vomiting last night, not yet today + muscle aches, less today + fatigue No marked loose stools/diarrhea + CP, thinks related to the cough, no radiating left sided chest pains  passing out episodes Alb prn and used once last week, aleve prn, zofran for nausea, tesselon perlles for cough and helped but ran out Sun pm, cough med makes her nauseaus  Comorbid conditions reviewed  +asthma - uses alb prn, not daily medicine No h/o DM, heart disease, CKD,  + obesity Wt Readings from Last 3 Encounters:  11/12/19 213 lb 9.6 oz (96.9 kg)  09/04/19 212 lb (96.2 kg)  08/05/19 212 lb 3.2 oz (96.3 kg)   Tobacco-never  smoker   Patient Active Problem List   Diagnosis Date Noted  . Arthritis 07/09/2018  . Chronic interstitial cystitis 07/09/2018  . Irritable bowel syndrome 07/09/2018  . Scoliosis deformity of spine 07/09/2018  . Asthma 04/27/2017  . Acid reflux 04/27/2017  . Hiatal hernia with GERD 04/27/2017  . Contact dermatitis 05/15/2016  . Fatigue 01/20/2016  . Mild dysplasia of cervix (CIN I) 09/29/2014  . Pap smear abnormality of cervix with LGSIL 09/21/2014  . FH: genetic disease carrier 04/22/2014  . Migraine headache 05/08/2013  . Bipolar disorder, unspecified ([place]) 05/08/2013  . Endometriosis 04/18/2012  . Chronic pelvic pain in female 04/18/2012  . Female genital symptoms 04/18/2012    Past Surgical History:  Procedure Laterality Date  . BREAST BIOPSY Left 02/08/2017  . CHOLECYSTECTOMY    . LAPAROSCOPY  2006  . TUBAL LIGATION      Family History  Problem Relation Age of Onset  . Cancer Mother 55       breast  . Breast cancer Mother 95  . Cancer Sister 31  . Breast cancer Sister 11  . Kidney disease Son   . Heart attack Maternal Grandfather   . Heart attack Paternal Grandmother   . Heart attack Paternal Grandfather     Social History   Tobacco Use  . Smoking status: Never Smoker  . Smokeless tobacco: Never Used  Substance Use Topics  . Alcohol use: Yes  Alcohol/week: 0.0 standard drinks    Comment: occasionally     Current Outpatient Medications:  .  albuterol (PROAIR HFA) 108 (90 Base) MCG/ACT inhaler, Inhale 2 puffs into the lungs every 4 (four) hours as needed for wheezing or shortness of breath., Disp: 18 g, Rfl: 2 .  cyclobenzaprine (FLEXERIL) 10 MG tablet, Take 10 mg by mouth 2 (two) times daily as needed. , Disp: , Rfl:  .  lurasidone (LATUDA) 40 MG TABS tablet, TAKE 1 TABLET (40 MG TOTAL) BY MOUTH DAILY WITH BREAKFAST., Disp: 30 tablet, Rfl: 5 .  medroxyPROGESTERone (DEPO-PROVERA) 150 MG/ML injection, Inject 1 mL (150 mg total) into the muscle every 3  (three) months., Disp: 1 mL, Rfl: 3 .  pantoprazole (PROTONIX) 40 MG tablet, Take 1 tablet (40 mg total) by mouth daily., Disp: 60 tablet, Rfl: 11 .  rizatriptan (MAXALT) 10 MG tablet, Take 1 tablet (10 mg total) by mouth as needed for migraine. May repeat in 2 hours if needed, Disp: 10 tablet, Rfl: 3 .  tiZANidine (ZANAFLEX) 4 MG tablet, Take 1 tablet (4 mg total) by mouth every 6 (six) hours as needed for muscle spasms., Disp: 30 tablet, Rfl: 3 .  ondansetron (ZOFRAN) 4 MG tablet, Take one tablet every 8 hours as needed. #10 to last 30 days. For migraines. (Patient not taking: Reported on 12/30/2019), Disp: 10 tablet, Rfl: 5  Allergies  Allergen Reactions  . Imitrex [Sumatriptan] Other (See Comments)    Heart palpitations, hot/cold sweats  . Demerol [Meperidine] Other (See Comments)    hallucinations  . Flagyl [Metronidazole] Hives    With staff assistance, above reviewed with the patient today.  ROS: As per HPI, otherwise no specific complaints on a limited and focused system review   Objective  Virtual encounter, vitals not obtained.  There is no height or weight on file to calculate BMI.  Physical Exam   Appears in NAD via conversation, occas cough with conversation Breathing: No obvious respiratory distress. Speaking in complete sentences Neurological: Pt is alert and oriented, Speech is normal Psychiatric: Patient has a normal mood and affect, behavior is normal. Judgment and thought content normal.   No results found for this or any previous visit (from the past 72 hour(s)).  PHQ2/9: Depression screen [facility] 2/9 12/30/2019 11/12/2019 08/05/2019 07/08/2019 05/12/2019  Decreased Interest 0 0 0 0 0  Down, Depressed, Hopeless 0 0 0 0 0  PHQ - 2 Score 0 0 0 0 0  Altered sleeping - 0 0 0 0  Tired, decreased energy - 0 0 0 0  Change in appetite - 0 0 0 0  Feeling bad or failure about yourself  - 0 0 0 0  Trouble concentrating - 0 0 0 0  Moving slowly or fidgety/restless - 0 0 0 0   Suicidal thoughts - 0 0 0 0  PHQ-9 Score - 0 0 0 0  Difficult doing work/chores - Not difficult at all Not difficult at all Not difficult at all Not difficult at all  Some recent data might be hidden   PHQ-2/9 Result reviewed  Fall Risk: Fall Risk  12/30/2019 11/12/2019 08/05/2019 07/08/2019 05/12/2019  Falls in the past year? 0 0 0 0 0  Number falls in past yr: 0 0 0 0 0  Injury with Fall? 0 0 0 0 0  Follow up Falls evaluation completed - - - -     Assessment & Plan  1. COVID-19  Educated on Covid and management recommendations, monoclonal  antibody treatments entertained for non-hospitalized patients at risk for severe disease within 10 days of symptom onset, and otherwise no Covid specific therapy to prescribe in the ambulatory setting.  Recommend continued treatment and symptomatic measures - stay well hydrated, rest, she noted the Tessalon Perles product was very helpful for her with her cough, and okay to refill that today.  If the nausea improves, recommended a mucinex or robitussin type OTC product as needed and can continue the Aleve product prn for any low grade temps/fevers/aches, take with food. Also continue the Zofran product she has as needed Also recommended using the albuterol inhaler more frequently, 2-3 times a day routinely for the next few days until symptoms improve, then return to just as needed use.  This can help with clearance. Discussed steroids, and the risk/benefits of these, and mutually agreed to proceed without presently   Remain isolated presently until recovered   If sx's worsening acutely, such as increased SOB, fevers, increased weakness or other concerning sx's arise, should f/u and may need to be seen an emergent/urgent setting as discussed.  2. Cough Did refill the Tessalon Perles for her as she notes they are very helpful.  3. Mild intermittent asthma with exacerbation As above, recommended more regularly use of the albuterol inhaler in the  short-term   I discussed the assessment and treatment plan with the patient. The patient was provided an opportunity to ask questions and all were answered. The patient agreed with the plan and demonstrated an understanding of the instructions.  Red flags and when to present for emergency care or RTC including fevers, increasing chest pain, shortness of breath, new/worsening/un-resolving symptoms reviewed with patient at time of visit.   The patient was advised to call back or seek an in-person evaluation if the symptoms worsen or if the condition fails to improve as anticipated.  I provided 15 minutes of non-face-to-face time during this encounter that included discussing at length patient's sx/history, pertinent pmhx, medications, treatment and follow up plan. This time also included the necessary documentation, orders, and chart review.  Towanda Malkin, MD

## 2020-01-06 ENCOUNTER — Telehealth: Payer: Self-pay

## 2020-01-06 NOTE — Telephone Encounter (Signed)
Copied from [place] [id number]. Topic: General - Other >> Jan 06, 2020 11:29 AM Rainey Pines A wrote: Patient just got over covid and cannot get rid of nausea and vomiting and wants to know from Dr. Roxan Hockey if this will run its course or if she needs to be seen again. Please advise >> Jan 06, 2020  2:57 PM Lennox Solders wrote: Pt is aware dr Roxan Hockey is not in the office today

## 2020-01-06 NOTE — Telephone Encounter (Signed)
If sx's of N/V not settling down, should have a follow-up.

## 2020-01-07 ENCOUNTER — Telehealth (INDEPENDENT_AMBULATORY_CARE_PROVIDER_SITE_OTHER): Payer: 59 | Admitting: Internal Medicine

## 2020-01-07 ENCOUNTER — Other Ambulatory Visit: Payer: Self-pay

## 2020-01-07 DIAGNOSIS — U071 COVID-19: Secondary | ICD-10-CM

## 2020-01-07 DIAGNOSIS — R059 Cough, unspecified: Secondary | ICD-10-CM

## 2020-01-07 DIAGNOSIS — R112 Nausea with vomiting, unspecified: Secondary | ICD-10-CM

## 2020-01-07 DIAGNOSIS — R05 Cough: Secondary | ICD-10-CM

## 2020-01-07 NOTE — Progress Notes (Signed)
Name: Anita Weaver   MRN: [id number]    DOB: 02-06-1976   Date:01/07/2020       Progress Note  Subjective  Chief Complaint  Chief Complaint  Patient presents with  . Cough  . Vomiting    patient having a hard time eating becuase she will get sick right after, she is trying to eat light foods like applesauce and bananas but can not hold it down  . Nausea  . Covid Positive    12/26/19 was diagnosed, first f/u here was 9/21    I connected with  Katrenia R Caponi on 01/07/20 at 10:20 AM EDT by telephone and verified that I am speaking with the correct person using two identifiers.  I discussed the limitations, risks, security and privacy concerns of performing an evaluation and management service by telephone and the availability of in person appointments. The patient expressed understanding and agreed to proceed. Staff also discussed with the patient that there may be a patient responsible charge related to this service. Patient Location: Home Provider Location: [facility] Additional Individuals present: none  HPI  Patient is a 44 year old female patient of Anita Weaver Was diagnosed with Covid 12/26/2019.  Did have a follow-up here 12/30/2019 Follows up again today via telephone visit with above complaints.  Notes some symptoms that persist include: + cough, no production in the more recent past No marked SOB No fever,  No sore throat and + mild sinus congestion + loss of smell, loss of taste, came back a couple days ago + N/V, has had significant problems trying to keep foods down in the recent past, as often vomits shortly after eating, staying bland with her diet, able to keep fluids down, no blood with the vomiting + muscle aches, better recent past No marked loose stools/diarrhea No CP, passing out episodes Has tried zofran - not help at all, tried yesterday and day before and used once, taking aleve for aches.  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  Patient Active Problem List   Diagnosis Date Noted  . COVID-19 12/30/2019  . Arthritis 07/09/2018  . Chronic interstitial cystitis 07/09/2018  . Irritable bowel syndrome 07/09/2018  . Scoliosis deformity of spine 07/09/2018  . Asthma 04/27/2017  . Acid reflux 04/27/2017  . Hiatal hernia with GERD 04/27/2017  . Contact dermatitis 05/15/2016  . Fatigue 01/20/2016  . Mild dysplasia of cervix (CIN I) 09/29/2014  . Pap smear abnormality of cervix with LGSIL 09/21/2014  . FH: genetic disease carrier 04/22/2014  . Migraine headache 05/08/2013  . Bipolar disorder, unspecified ([place]) 05/08/2013  . Endometriosis 04/18/2012  . Chronic pelvic pain in female 04/18/2012  . Female genital symptoms 04/18/2012    Past Surgical History:  Procedure Laterality Date  . BREAST BIOPSY Left 02/08/2017  . CHOLECYSTECTOMY    . LAPAROSCOPY  2006  . TUBAL LIGATION      Family History  Problem Relation Age of Onset  . Cancer Mother 2       breast  . Breast cancer Mother 57  . Cancer Sister 90  . Breast cancer Sister 27  . Kidney disease Son   . Heart attack Maternal Grandfather   . Heart attack Paternal Grandmother   . Heart attack  Paternal Grandfather     Social History   Tobacco Use  . Smoking status: Never Smoker  . Smokeless tobacco: Never Used  Substance Use Topics  . Alcohol use: Yes    Alcohol/week: 0.0 standard drinks    Comment: occasionally     Current Outpatient Medications:  .  albuterol (PROAIR HFA) 108 (90 Base) MCG/ACT inhaler, Inhale 2 puffs into the lungs every 4 (four) hours as needed for wheezing or shortness of breath., Disp: 18 g, Rfl: 2 .  benzonatate (TESSALON PERLES) 100 MG capsule, Take 1 capsule (100 mg total) by mouth 3 (three) times daily as needed for cough.,  Disp: 30 capsule, Rfl: 1 .  cyclobenzaprine (FLEXERIL) 10 MG tablet, Take 10 mg by mouth 2 (two) times daily as needed. , Disp: , Rfl:  .  Dextromethorphan-guaiFENesin ([place] DM PO), Take by mouth., Disp: , Rfl:  .  lurasidone (LATUDA) 40 MG TABS tablet, TAKE 1 TABLET (40 MG TOTAL) BY MOUTH DAILY WITH BREAKFAST., Disp: 30 tablet, Rfl: 5 .  medroxyPROGESTERone (DEPO-PROVERA) 150 MG/ML injection, Inject 1 mL (150 mg total) into the muscle every 3 (three) months., Disp: 1 mL, Rfl: 3 .  pantoprazole (PROTONIX) 40 MG tablet, Take 1 tablet (40 mg total) by mouth daily., Disp: 60 tablet, Rfl: 11 .  rizatriptan (MAXALT) 10 MG tablet, Take 1 tablet (10 mg total) by mouth as needed for migraine. May repeat in 2 hours if needed, Disp: 10 tablet, Rfl: 3 .  tiZANidine (ZANAFLEX) 4 MG tablet, Take 1 tablet (4 mg total) by mouth every 6 (six) hours as needed for muscle spasms., Disp: 30 tablet, Rfl: 3 .  ondansetron (ZOFRAN) 4 MG tablet, Take one tablet every 8 hours as needed. #10 to last 30 days. For migraines. (Patient not taking: Reported on 01/07/2020), Disp: 10 tablet, Rfl: 5  Allergies  Allergen Reactions  . Imitrex [Sumatriptan] Other (See Comments)    Heart palpitations, hot/cold sweats  . Demerol [Meperidine] Other (See Comments)    hallucinations  . Flagyl [Metronidazole] Hives    With staff assistance, above reviewed with the patient today.  ROS: As per HPI, otherwise no specific complaints on a limited and focused system review   Objective  Virtual encounter, vitals not obtained.  There is no height or weight on file to calculate BMI.  Physical Exam   Appears in NAD via conversation, had a couple coughing episodes during the conversation Breathing: No obvious respiratory distress. Speaking in complete sentences Neurological: Pt is alert, Speech is normal Psychiatric: Patient has a normal mood and affect, behavior is normal. Judgment and thought content normal.   No results found  for this or any previous visit (from the past 72 hour(s)).  PHQ2/9: Depression screen [facility] 2/9 01/07/2020 12/30/2019 11/12/2019 08/05/2019 07/08/2019  Decreased Interest 0 0 0 0 0  Down, Depressed, Hopeless 0 0 0 0 0  PHQ - 2 Score 0 0 0 0 0  Altered sleeping - - 0 0 0  Tired, decreased energy - - 0 0 0  Change in appetite - - 0 0 0  Feeling bad or failure about yourself  - - 0 0 0  Trouble concentrating - - 0 0 0  Moving slowly or fidgety/restless - - 0 0 0  Suicidal thoughts - - 0 0 0  PHQ-9 Score - - 0 0 0  Difficult doing work/chores - - Not difficult at all Not difficult at all Not difficult at all  Some recent data might  be hidden   PHQ-2/9 Result reviewed  Fall Risk: Fall Risk  01/07/2020 12/30/2019 11/12/2019 08/05/2019 07/08/2019  Falls in the past year? 0 0 0 0 0  Number falls in past yr: 0 0 0 0 0  Injury with Fall? 0 0 0 0 0  Follow up Falls evaluation completed Falls evaluation completed - - -     Assessment & Plan  1. COVID-19/cough 2. Non-intractable vomiting with nausea, unspecified vomiting type  Patient notes some symptoms have improved including her taste and smell, not having fevers, less muscle aches, although the nausea and vomiting after trying to eat solid foods has continued.  She has tried a more bland diet, also tried a Zofran product, a couple doses in the last 2 days without much help.  Is able to keep fluids down presently. She notes she still has the Zofran product at home, and recommended trying to utilize that again over the next couple days as needed, as can be helpful.  Can try a Pepto-Bismol type product as well, as that can help settle the stomach at times. Continue with the bland diet, with an emphasis on continuing to stay well-hydrated with increased fluids. Would stop taking the Aleve product, and not use ibuprofen products presently as well as they can bother the stomach.  Noted acetaminophen type products are better to use as needed  presently Recommend continued treatment with symptomatic measures -  rest, a mucinex or robitussin type OTC product for cough.  Emphasized if she is unable to keep fluids down, starts to feel lightheaded or has passing out feelings, having increased fevers, any shortness of breath, increasing chest congestion or production to her cough, the importance of being seen more emergently in an ER setting, and she was understanding of that.  She noted she is hesitant to proceed to that setting given the long wait times note, and emphasized the importance of proceeding there if her symptoms are worsening as we discussed, as does need IV fluids and some more urgent lab testing as part of that  Remain isolated presently until recovered and RTW discussed - recommended ok to return if at least 10 days since first symptoms, no fever at least 24-48 hours since recovery and other sx's have much improved/resolved.  Not able to return to work presently.  Noted if she does need a note to return, can follow-up to help confirm symptoms much improved and okay to return to work at that time.  I discussed the assessment and treatment plan with the patient. The patient was provided an opportunity to ask questions and all were answered. The patient agreed with the plan and demonstrated an understanding of the instructions.   The patient was advised to call back or seek an in-person evaluation if the symptoms worsen or if the condition fails to improve as anticipated.  I provided 20 minutes of non-face-to-face time during this encounter that included discussing at length patient's sx/history, pertinent pmhx, medications, treatment and follow up plan. This time also included the necessary documentation, orders, and chart review.  Towanda Malkin, MD

## 2020-01-19 ENCOUNTER — Telehealth: Payer: Self-pay | Admitting: Radiology

## 2020-01-19 NOTE — Telephone Encounter (Signed)
Left message for patient to call [id number] to schedule [facility] consult for patient to continue receiving depo injection

## 2020-02-04 ENCOUNTER — Other Ambulatory Visit: Payer: Self-pay

## 2020-02-04 ENCOUNTER — Encounter: Payer: Self-pay | Admitting: Advanced Practice Midwife

## 2020-02-04 ENCOUNTER — Telehealth (INDEPENDENT_AMBULATORY_CARE_PROVIDER_SITE_OTHER): Payer: 59 | Admitting: Advanced Practice Midwife

## 2020-02-04 DIAGNOSIS — Z3042 Encounter for surveillance of injectable contraceptive: Secondary | ICD-10-CM | POA: Diagnosis not present

## 2020-02-04 NOTE — Progress Notes (Signed)
    GYNECOLOGY VIRTUAL VISIT ENCOUNTER NOTE  Provider location: [facility] [place] at [facility]   I connected with Anita Weaver on 02/04/20 at  3:30 PM EDT by MyChart Video Encounter at home and verified that I am speaking with the correct person using two identifiers.   I discussed the limitations, risks, security and privacy concerns of performing an evaluation and management service virtually and the availability of in person appointments. I also discussed with the patient that there may be a patient responsible charge related to this service. The patient expressed understanding and agreed to proceed.   History:  Anita Weaver is a 44 y.o. [id number] female being evaluated today for Depo Provera contraception surveillance. She denies any abnormal vaginal discharge, bleeding, pelvic pain or other concerns.  Patient receives her GYN care with [facility] but presents to [facility] for Depo as it is convenient to her home.     Past Medical History:  Diagnosis Date  . Allergy   . Arthritis   . Asthma   . Chronic pelvic pain in female   . Endometriosis   . FH: breast cancer in first degree relative    Mother and sister both are BRCA positive; patient is BRCA negative  . GERD (gastroesophageal reflux disease)   . Headache    migraines  . Heart murmur   . Hyperlipidemia   . Hypertension   . Interstitial cystitis   . Irritable bowel disease   . Neck pain    Past Surgical History:  Procedure Laterality Date  . BREAST BIOPSY Left 02/08/2017  . CHOLECYSTECTOMY    . LAPAROSCOPY  2006  . TUBAL LIGATION     The following portions of the patient's history were reviewed and updated as appropriate: allergies, current medications, past family history, past medical history, past social history, past surgical history and problem list.   Health Maintenance:  Normal pap and negative HRHPV 2020 per patient.    Review of Systems:  Pertinent items noted in HPI and remainder of  comprehensive ROS otherwise negative.  Physical Exam:   General:  Alert, oriented and cooperative. Patient appears to be in no acute distress.  Mental Status: Normal mood and affect. Normal behavior. Normal judgment and thought content.   Respiratory: Normal respiratory effort, no problems with respiration noted  Rest of physical exam deferred due to type of encounter  Labs and Imaging No results found for this or any previous visit (from the past 336 hour(s)). No results found.     Assessment and Plan:     1. Encounter for surveillance of injectable contraceptive - Initiated 2017 - All GYN care managed by [facility]. Requested patient complete records request to document all care received     I discussed the assessment and treatment plan with the patient. The patient was provided an opportunity to ask questions and all were answered. The patient agreed with the plan and demonstrated an understanding of the instructions.   The patient was advised to call back or seek an in-person evaluation/go to the ED if the symptoms worsen or if the condition fails to improve as anticipated.  I provided 7 minutes of face-to-face time during this encounter.   Darlina Rumpf, [place] for [employer], [place]

## 2020-02-04 NOTE — Progress Notes (Signed)
Has an appt for pap next month with Dr Cletis Media, last pap was a year ago.   I connected with  Anita Weaver on 02/04/20 at  3:30 PM EDT by telephone and verified that I am speaking with the correct person using two identifiers.   I discussed the limitations, risks, security and privacy concerns of performing an evaluation and management service by telephone and the availability of in person appointments. I also discussed with the patient that there may be a patient responsible charge related to this service. The patient expressed understanding and agreed to proceed.  Crosby Oyster, RN 02/04/2020  3:31 PM

## 2020-03-23 DIAGNOSIS — R519 Headache, unspecified: Secondary | ICD-10-CM | POA: Insufficient documentation

## 2020-03-25 ENCOUNTER — Other Ambulatory Visit: Payer: Self-pay

## 2020-03-25 ENCOUNTER — Ambulatory Visit (INDEPENDENT_AMBULATORY_CARE_PROVIDER_SITE_OTHER): Payer: 59 | Admitting: *Deleted

## 2020-03-25 VITALS — BP 140/89 | HR 106

## 2020-03-25 DIAGNOSIS — Z3042 Encounter for surveillance of injectable contraceptive: Secondary | ICD-10-CM | POA: Diagnosis not present

## 2020-03-25 MED ORDER — MEDROXYPROGESTERONE ACETATE 150 MG/ML IM SUSP
150.0000 mg | Freq: Once | INTRAMUSCULAR | Status: AC
  Administered 2020-03-25: 150 mg via INTRAMUSCULAR

## 2020-03-25 NOTE — Progress Notes (Signed)
Patient was assessed and managed by nursing staff during this encounter. I have reviewed the chart and agree with the documentation and plan. I have also made any necessary editorial changes.  Verita Schneiders, MD 03/25/2020 6:09 PM

## 2020-03-25 NOTE — Progress Notes (Signed)
Date last pap: 02/2020-at [facility]. Last Depo-Provera: 12/25/2019 Side Effects if any: none. Serum HCG indicated? NA. Depo-Provera 150 mg IM given by: Crosby Oyster, RN Next appointment due Mar 3-17th.

## 2020-03-30 ENCOUNTER — Encounter: Payer: Self-pay | Admitting: Radiology

## 2020-04-05 ENCOUNTER — Telehealth: Payer: Self-pay

## 2020-04-05 NOTE — Telephone Encounter (Signed)
Please advise.  No appointments available

## 2020-04-05 NOTE — Telephone Encounter (Signed)
Copied from [place] [id number]. Topic: General - Inquiry >> Apr 05, 2020  2:05 PM Greggory Keen D wrote: Reason for CRM: pt called saying  Her hair is coming out and she thinks it is due to a medication she is taking.  CB#  [phone]

## 2020-04-06 NOTE — Telephone Encounter (Signed)
Left message for patient to call office.  

## 2020-04-15 ENCOUNTER — Ambulatory Visit: Payer: 59 | Admitting: Family Medicine

## 2020-04-27 ENCOUNTER — Ambulatory Visit: Payer: 59 | Admitting: Family Medicine

## 2020-05-14 ENCOUNTER — Encounter: Payer: Self-pay | Admitting: Family Medicine

## 2020-05-14 ENCOUNTER — Other Ambulatory Visit: Payer: Self-pay

## 2020-05-14 ENCOUNTER — Telehealth: Payer: 59 | Admitting: Family Medicine

## 2020-05-14 VITALS — BP 138/84 | HR 96 | Temp 98.4°F | Resp 16 | Ht 68.0 in | Wt 213.8 lb

## 2020-05-14 DIAGNOSIS — J45901 Unspecified asthma with (acute) exacerbation: Secondary | ICD-10-CM

## 2020-05-14 DIAGNOSIS — Z5181 Encounter for therapeutic drug level monitoring: Secondary | ICD-10-CM

## 2020-05-14 DIAGNOSIS — G43909 Migraine, unspecified, not intractable, without status migrainosus: Secondary | ICD-10-CM

## 2020-05-14 DIAGNOSIS — R109 Unspecified abdominal pain: Secondary | ICD-10-CM

## 2020-05-14 DIAGNOSIS — E559 Vitamin D deficiency, unspecified: Secondary | ICD-10-CM | POA: Diagnosis not present

## 2020-05-14 DIAGNOSIS — E785 Hyperlipidemia, unspecified: Secondary | ICD-10-CM

## 2020-05-14 DIAGNOSIS — D582 Other hemoglobinopathies: Secondary | ICD-10-CM

## 2020-05-14 DIAGNOSIS — L659 Nonscarring hair loss, unspecified: Secondary | ICD-10-CM

## 2020-05-14 DIAGNOSIS — Z1159 Encounter for screening for other viral diseases: Secondary | ICD-10-CM

## 2020-05-14 DIAGNOSIS — R5383 Other fatigue: Secondary | ICD-10-CM

## 2020-05-14 DIAGNOSIS — M545 Low back pain, unspecified: Secondary | ICD-10-CM

## 2020-05-14 DIAGNOSIS — Z114 Encounter for screening for human immunodeficiency virus [HIV]: Secondary | ICD-10-CM

## 2020-05-14 MED ORDER — ONDANSETRON HCL 4 MG PO TABS: ORAL_TABLET | ORAL | 5 refills | Status: DC

## 2020-05-14 MED ORDER — ALBUTEROL SULFATE HFA 108 (90 BASE) MCG/ACT IN AERS: 2.0000 | INHALATION_SPRAY | RESPIRATORY_TRACT | 2 refills | Status: DC | PRN

## 2020-05-14 NOTE — Progress Notes (Signed)
Name: Anita Weaver   MRN: [id number]    DOB: 1975-12-15   Date:05/14/2020       Progress Note  Chief Complaint  Patient presents with  . Hypertension  . Hyperlipidemia  . Hair/Scalp Problem    Hair loss since having covid     Subjective:   Anita Weaver is a 45 y.o. female, presents to clinic for routine dx and concern re hair loss  Hair loss this last year was concerned about it being a medication side effect -  She would like labs checked for his -    Moderate asthma -  Still nighttime symptoms and waking, relieved with albuterol and with positional changes - usually rolling onto left side  Occurs lately only 1-2 x a month  Bronchitis and pneumonia used to occur often  Neuro - managing migraines  HLD -  Hyperlipidemia: Not on meds for HLD, has been on HTN meds in the past No immediate family relatives with early MI CVA Grandparents all had heart attacks at older age  Last Lipids: Lab Results  Component Value Date   CHOL 212 (H) 01/23/2019   HDL 40 (L) 01/23/2019   LDLCALC 151 (H) 01/23/2019   TRIG 98 01/23/2019   CHOLHDL 5.3 (H) 01/23/2019   - Denies: Chest pain, shortness of breath, myalgias, claudication;  The 10-year ASCVD risk score Mikey Bussing DC Brooke Bonito., et al., 2013) is: 1.6%   Values used to calculate the score:     Age: 49 years     Sex: Female     Is Non-Hispanic African American: No     Diabetic: No     Tobacco smoker: No     Systolic Blood Pressure: 176 mmHg     Is BP treated: No     HDL Cholesterol: 40 mg/dL     Total Cholesterol: 212 mg/dL      Current Outpatient Medications:  .  albuterol (PROAIR HFA) 108 (90 Base) MCG/ACT inhaler, Inhale 2 puffs into the lungs every 4 (four) hours as needed for wheezing or shortness of breath., Disp: 18 g, Rfl: 2 .  benzonatate (TESSALON PERLES) 100 MG capsule, Take 1 capsule (100 mg total) by mouth 3 (three) times daily as needed for cough., Disp: 30 capsule, Rfl: 1 .  cyclobenzaprine (FLEXERIL) 10 MG tablet,  Take 10 mg by mouth 2 (two) times daily as needed. , Disp: , Rfl:  .  Dextromethorphan-guaiFENesin ([place] DM PO), Take by mouth., Disp: , Rfl:  .  lurasidone (LATUDA) 40 MG TABS tablet, TAKE 1 TABLET (40 MG TOTAL) BY MOUTH DAILY WITH BREAKFAST., Disp: 30 tablet, Rfl: 5 .  medroxyPROGESTERone (DEPO-PROVERA) 150 MG/ML injection, Inject 1 mL (150 mg total) into the muscle every 3 (three) months., Disp: 1 mL, Rfl: 3 .  ondansetron (ZOFRAN) 4 MG tablet, Take one tablet every 8 hours as needed. #10 to last 30 days. For migraines. (Patient not taking: Reported on 01/07/2020), Disp: 10 tablet, Rfl: 5 .  pantoprazole (PROTONIX) 40 MG tablet, Take 1 tablet (40 mg total) by mouth daily., Disp: 60 tablet, Rfl: 11 .  rizatriptan (MAXALT) 10 MG tablet, Take 1 tablet (10 mg total) by mouth as needed for migraine. May repeat in 2 hours if needed, Disp: 10 tablet, Rfl: 3 .  tiZANidine (ZANAFLEX) 4 MG tablet, Take 1 tablet (4 mg total) by mouth every 6 (six) hours as needed for muscle spasms., Disp: 30 tablet, Rfl: 3  Patient Active Problem List   Diagnosis Date  Noted  . COVID-19 12/30/2019  . Arthritis 07/09/2018  . Chronic interstitial cystitis 07/09/2018  . Irritable bowel syndrome 07/09/2018  . Scoliosis deformity of spine 07/09/2018  . Asthma 04/27/2017  . Acid reflux 04/27/2017  . Hiatal hernia with GERD 04/27/2017  . Contact dermatitis 05/15/2016  . Fatigue 01/20/2016  . Mild dysplasia of cervix (CIN I) 09/29/2014  . Pap smear abnormality of cervix with LGSIL 09/21/2014  . FH: genetic disease carrier 04/22/2014  . Migraine headache 05/08/2013  . Bipolar disorder, unspecified ([place]) 05/08/2013  . Endometriosis 04/18/2012  . Chronic pelvic pain in female 04/18/2012  . Female genital symptoms 04/18/2012    Past Surgical History:  Procedure Laterality Date  . BREAST BIOPSY Left 02/08/2017  . CHOLECYSTECTOMY    . LAPAROSCOPY  2006  . TUBAL LIGATION      Family History  Problem Relation Age of  Onset  . Cancer Mother 84       breast  . Breast cancer Mother 24  . Cancer Sister 20  . Breast cancer Sister 60  . Kidney disease Son   . Heart attack Maternal Grandfather   . Heart attack Paternal Grandmother   . Heart attack Paternal Grandfather     Social History   Tobacco Use  . Smoking status: Never Smoker  . Smokeless tobacco: Never Used  Vaping Use  . Vaping Use: Never used  Substance Use Topics  . Alcohol use: Yes    Alcohol/week: 0.0 standard drinks    Comment: occasionally  . Drug use: No     Allergies  Allergen Reactions  . Imitrex [Sumatriptan] Other (See Comments)    Heart palpitations, hot/cold sweats  . Demerol [Meperidine] Other (See Comments)    hallucinations  . Flagyl [Metronidazole] Hives    Health Maintenance  Topic Date Due  . Hepatitis C Screening  Never done  . TETANUS/TDAP  Never done  . INFLUENZA VACCINE  Never done  . PAP SMEAR-Modifier  12/06/2019  . COVID-19 Vaccine (2 - Inadvertent 3-dose series) 03/02/2020  . HIV Screening  Completed    Chart Review Today: I personally reviewed active problem list, medication list, allergies, family history, social history, health maintenance, notes from last encounter, lab results, imaging with the patient/caregiver today.   Review of Systems  All systems reviewed and are negative for acute change except as noted in the HPI.  Objective:   Vitals:   05/14/20 1450  BP: 138/84  Pulse: 96  Resp: 16  Temp: 98.4 F (36.9 C)  TempSrc: Oral  SpO2: 97%  Weight: 213 lb 12.8 oz (97 kg)  Height: 5\' 8"  (1.727 m)    Body mass index is 32.51 kg/m.  Physical Exam Vitals and nursing note reviewed.  Constitutional:      General: She is not in acute distress.    Appearance: Normal appearance. She is well-developed. She is obese. She is not ill-appearing, toxic-appearing or diaphoretic.     Interventions: Face mask in place.  HENT:     Head: Normocephalic and atraumatic.     Right Ear:  External ear normal.     Left Ear: External ear normal.  Eyes:     General: Lids are normal. No scleral icterus.       Right eye: No discharge.        Left eye: No discharge.     Conjunctiva/sclera: Conjunctivae normal.  Neck:     Trachea: Phonation normal. No tracheal deviation.  Cardiovascular:     Rate  and Rhythm: Normal rate and regular rhythm.     Pulses: Normal pulses.          Radial pulses are 2+ on the right side and 2+ on the left side.       Posterior tibial pulses are 2+ on the right side and 2+ on the left side.     Heart sounds: Normal heart sounds. No murmur heard. No friction rub. No gallop.   Pulmonary:     Effort: Pulmonary effort is normal. No respiratory distress.     Breath sounds: Normal breath sounds. No stridor. No wheezing, rhonchi or rales.  Chest:     Chest wall: No tenderness.  Abdominal:     General: Bowel sounds are normal. There is no distension.     Palpations: Abdomen is soft.  Musculoskeletal:     Right lower leg: No edema.     Left lower leg: No edema.  Skin:    General: Skin is warm and dry.     Coloration: Skin is not jaundiced or pale.     Findings: No rash.  Neurological:     Mental Status: She is alert.     Motor: No abnormal muscle tone.     Gait: Gait normal.  Psychiatric:        Mood and Affect: Mood normal.        Speech: Speech normal.        Behavior: Behavior normal.         Assessment & Plan:   1. Hyperlipidemia, unspecified hyperlipidemia type Lipids significantly elevated in the past, fortunately no family history, recheck labs, ASCVD score is low discussed diet lifestyle efforts that would improve cholesterol, discussed indications for starting statin medication - COMPLETE METABOLIC PANEL WITH GFR - Lipid panel  2. Vitamin D deficiency -monitoring calcium last vitamin D was near normal - COMPLETE METABOLIC PANEL WITH GFR  3. Moderate asthma with exacerbation, unspecified whether persistent Stable, improving,  much less shortness of breath and needing rescue inhaler rarely  4. Migraine without status migrainosus, not intractable, unspecified migraine type She asked for refill on Zofran for nausea associated with migraines - ondansetron (ZOFRAN) 4 MG tablet; Take one tablet every 8 hours as needed. #10 to last 30 days. For migraines.  Dispense: 10 tablet; Refill: 5  5. Medication monitoring encounter  - CBC with Differential/Platelet - COMPLETE METABOLIC PANEL WITH GFR - Lipid panel - TSH - Iron, TIBC and Ferritin Panel - ondansetron (ZOFRAN) 4 MG tablet; Take one tablet every 8 hours as needed. #10 to last 30 days. For migraines.  Dispense: 10 tablet; Refill: 5  6. Screening for HIV without presence of risk factors  - HIV Antibody (routine testing w rflx)  7. Encounter for hepatitis C screening test for low risk patient  - Hepatitis C antibody  8. Hair loss disorder R/o iron deficiency or hypothyroid - refer to derm if no etiology found - Iron, TIBC and Ferritin Panel  9. Fatigue, unspecified type Recheck labs, chronic, associated with multiple chronic conditions - Iron, TIBC and Ferritin Panel  10. Abnormal hemoglobin (HCC) Recheck cbc and iron panel - Iron, TIBC and Ferritin Panel   Return in about 6 months (around 11/11/2020) for Routine follow-up.   [name], PA-C 05/14/20 2:51 PM

## 2020-05-17 LAB — COMPLETE METABOLIC PANEL WITH GFR
AG Ratio: 1.6 (calc) (ref 1.0–2.5)
ALT: 16 U/L (ref 6–29)
AST: 13 U/L (ref 10–30)
Albumin: 4.2 g/dL (ref 3.6–5.1)
Alkaline phosphatase (APISO): 41 U/L (ref 31–125)
BUN: 10 mg/dL (ref 7–25)
CO2: 27 mmol/L (ref 20–32)
Calcium: 9.6 mg/dL (ref 8.6–10.2)
Chloride: 104 mmol/L (ref 98–110)
Creat: 0.89 mg/dL (ref 0.50–1.10)
GFR, Est African American: 91 mL/min/{1.73_m2} (ref >=60)
GFR, Est Non African American: 79 mL/min/{1.73_m2} (ref >=60)
Globulin: 2.6 g/dL (calc) (ref 1.9–3.7)
Glucose, Bld: 82 mg/dL (ref 65–99)
Potassium: 4 mmol/L (ref 3.5–5.3)
Sodium: 140 mmol/L (ref 135–146)
Total Bilirubin: 0.6 mg/dL (ref 0.2–1.2)
Total Protein: 6.8 g/dL (ref 6.1–8.1)

## 2020-05-17 LAB — LIPID PANEL
Cholesterol: 179 mg/dL (ref <200)
HDL: 34 mg/dL — ABNORMAL LOW (ref >=50)
LDL Cholesterol (Calc): 117 mg/dL (calc) — ABNORMAL HIGH
Non-HDL Cholesterol (Calc): 145 mg/dL (calc) — ABNORMAL HIGH (ref <130)
Total CHOL/HDL Ratio: 5.3 (calc) — ABNORMAL HIGH (ref <5.0)
Triglycerides: 168 mg/dL — ABNORMAL HIGH (ref <150)

## 2020-05-17 LAB — CBC WITH DIFFERENTIAL/PLATELET
Absolute Monocytes: 559 cells/uL (ref 200–950)
Basophils Absolute: 49 cells/uL (ref 0–200)
Basophils Relative: 0.6 %
Eosinophils Absolute: 219 cells/uL (ref 15–500)
Eosinophils Relative: 2.7 %
HCT: 42.9 % (ref 35.0–45.0)
Hemoglobin: 15 g/dL (ref 11.7–15.5)
Lymphs Abs: 3475 cells/uL (ref 850–3900)
MCH: 30.8 pg (ref 27.0–33.0)
MCHC: 35 g/dL (ref 32.0–36.0)
MCV: 88.1 fL (ref 80.0–100.0)
MPV: 8.9 fL (ref 7.5–12.5)
Monocytes Relative: 6.9 %
Neutro Abs: 3799 cells/uL (ref 1500–7800)
Neutrophils Relative %: 46.9 %
Platelets: 329 10*3/uL (ref 140–400)
RBC: 4.87 10*6/uL (ref 3.80–5.10)
RDW: 12.9 % (ref 11.0–15.0)
Total Lymphocyte: 42.9 %
WBC: 8.1 10*3/uL (ref 3.8–10.8)

## 2020-05-17 LAB — IRON,TIBC AND FERRITIN PANEL
%SAT: 26 % (calc) (ref 16–45)
Ferritin: 58 ng/mL (ref 16–232)
Iron: 67 ug/dL (ref 40–190)
TIBC: 260 mcg/dL (calc) (ref 250–450)

## 2020-05-17 LAB — HEPATITIS C ANTIBODY
Hepatitis C Ab: NONREACTIVE
SIGNAL TO CUT-OFF: 0.01 (ref <1.00)

## 2020-05-17 LAB — TSH: TSH: 2 mIU/L

## 2020-05-17 LAB — HIV ANTIBODY (ROUTINE TESTING W REFLEX): HIV 1&2 Ab, 4th Generation: NONREACTIVE

## 2020-06-10 ENCOUNTER — Ambulatory Visit: Payer: 59

## 2020-06-14 ENCOUNTER — Other Ambulatory Visit: Payer: Self-pay

## 2020-06-14 ENCOUNTER — Ambulatory Visit (INDEPENDENT_AMBULATORY_CARE_PROVIDER_SITE_OTHER): Payer: 59

## 2020-06-14 ENCOUNTER — Telehealth: Payer: Self-pay | Admitting: Gastroenterology

## 2020-06-14 VITALS — BP 142/96 | HR 99

## 2020-06-14 DIAGNOSIS — Z3042 Encounter for surveillance of injectable contraceptive: Secondary | ICD-10-CM

## 2020-06-14 MED ORDER — MEDROXYPROGESTERONE ACETATE 150 MG/ML IM SUSP
150.0000 mg | Freq: Once | INTRAMUSCULAR | Status: AC
  Administered 2020-06-14: 150 mg via INTRAMUSCULAR

## 2020-06-14 NOTE — Progress Notes (Signed)
Date last pap: 02/2020 - [facility] Last Depo-Provera: 03/25/2020 Side Effects if any: None Serum HCG indicated? NA Depo-Provera 150 mg IM given by: Mickey Farber, CMA Next appointment due: May 23rd - Aug 6th, 2022

## 2020-06-14 NOTE — Telephone Encounter (Signed)
pantoprazole (PROTONIX) 40 MG tablet, once a day  [facility] [place]  90 day

## 2020-06-16 ENCOUNTER — Other Ambulatory Visit: Payer: Self-pay

## 2020-06-16 DIAGNOSIS — R0600 Dyspnea, unspecified: Secondary | ICD-10-CM

## 2020-06-16 MED ORDER — PANTOPRAZOLE SODIUM 40 MG PO TBEC: 40.0000 mg | DELAYED_RELEASE_TABLET | Freq: Every day | ORAL | 1 refills | Status: DC

## 2020-06-16 NOTE — Telephone Encounter (Signed)
Prescription for Pantoprazole has been sent to [facility] [place].

## 2020-06-17 NOTE — Progress Notes (Signed)
Patient was assessed and managed by nursing staff during this encounter. I have reviewed the chart and agree with the documentation and plan. I have also made any necessary editorial changes.  Aletha Halim, MD 06/17/2020 8:31 AM

## 2020-06-18 ENCOUNTER — Ambulatory Visit (INDEPENDENT_AMBULATORY_CARE_PROVIDER_SITE_OTHER): Payer: 59 | Admitting: Adult Health

## 2020-06-18 ENCOUNTER — Other Ambulatory Visit: Payer: Self-pay

## 2020-06-18 ENCOUNTER — Encounter: Payer: Self-pay | Admitting: Adult Health

## 2020-06-18 DIAGNOSIS — G4700 Insomnia, unspecified: Secondary | ICD-10-CM | POA: Diagnosis not present

## 2020-06-18 DIAGNOSIS — R0600 Dyspnea, unspecified: Secondary | ICD-10-CM

## 2020-06-18 DIAGNOSIS — F3130 Bipolar disorder, current episode depressed, mild or moderate severity, unspecified: Secondary | ICD-10-CM

## 2020-06-18 MED ORDER — LURASIDONE HCL 40 MG PO TABS: ORAL_TABLET | ORAL | 5 refills | Status: DC

## 2020-06-18 MED ORDER — PANTOPRAZOLE SODIUM 40 MG PO TBEC: 40.0000 mg | DELAYED_RELEASE_TABLET | Freq: Every day | ORAL | 1 refills | Status: DC

## 2020-06-18 NOTE — Progress Notes (Signed)
Anita Weaver [id number] Apr 10, 1976 45 y.o.  Subjective:   Patient ID:  Anita Weaver is a 45 y.o. (DOB Nov 02, 1975) female.  Chief Complaint: No chief complaint on file.   HPI Anita Weaver presents to the office today for follow-up of BPD 1 and insomnia.  Describes mood today as "ok". Pleasant. Mood symptoms - denies depression, anxiety, and irritability. Stating "I'm doing good". Feels like the Latuda at 61m daily. Stable interest and motivation. Taking medications as prescribed.  Energy levels lower. Active, does not have a regular exercise routine.  Enjoys some usual interests and activities. Married. Lives with husband of 8 years. Has 3 grown sons. [place] - [place]/PNA. .Marland Kitchen Appetite adequate. Weight stable - 210 pounds - _0 . Sleeps well most nights. Averages 8 to 9 hours. Focus and concentration stable. Completing tasks. Managing aspects of household. Works full time as a pAir traffic controller- 4 to 5 years.  Denies SI or HI.  Denies AH or VH.      [id number]  Flowsheet Row [facility] Total Score 0 0 0 0 0  PHQ-9 Total Score -- -- -- 0 0       Review of Systems:  Review of Systems  Musculoskeletal: Negative for gait problem.  Neurological: Negative for tremors.  Psychiatric/Behavioral:       Please refer to HPI    Medications: I have reviewed the patient's current medications.  Current Outpatient Medications  Medication Sig Dispense Refill  . albuterol (PROAIR HFA) 108 (90 Base) MCG/ACT inhaler Inhale 2 puffs into the lungs every 4 (four) hours as needed for wheezing or shortness of breath. 18 g 2  . Dextromethorphan-guaiFENesin ([place] DM PO) Take by mouth.    . lurasidone (LATUDA) 40 MG TABS tablet TAKE 1 TABLET (40 MG TOTAL) BY MOUTH DAILY WITH BREAKFAST. 30 tablet 5  . medroxyPROGESTERone (DEPO-PROVERA) 150 MG/ML injection Inject 1 mL (150 mg total) into the muscle every 3 (three) months. 1 mL 3  . ondansetron (ZOFRAN) 4 MG tablet Take one tablet every 8 hours as needed. #10 to last 30 days. For migraines. 10 tablet 5  . pantoprazole (PROTONIX) 40 MG tablet Take 1 tablet (40 mg total) by mouth daily. 90 tablet 1  . rizatriptan (MAXALT) 10 MG tablet Take 1 tablet (10 mg total) by mouth as needed for migraine. May repeat in 2 hours if needed 10 tablet 3   No current facility-administered medications for this visit.    Medication Side Effects: None  Allergies:  Allergies  Allergen Reactions  . Imitrex [Sumatriptan] Other (See Comments)    Heart palpitations, hot/cold sweats  . Demerol [Meperidine] Other (See Comments)    hallucinations  . Flagyl [Metronidazole] Hives    Past Medical History:  Diagnosis Date  . Allergy   . Arthritis   . Asthma   . Chronic pelvic pain in female   . Endometriosis   . FH: breast cancer in first degree relative    Mother and sister both are BRCA positive; patient is BRCA negative  . GERD (gastroesophageal reflux disease)   . Headache    migraines  . Heart murmur   . Hyperlipidemia   .  Hypertension   . Interstitial cystitis   . Irritable bowel disease   . Neck pain     Family History  Problem Relation Age of Onset  . Cancer Mother 64       breast  . Breast cancer Mother 15  . Cancer Sister 6  . Breast cancer Sister 55  . Kidney disease Son   . Heart attack Maternal Grandfather   . Heart attack Paternal Grandmother   . Heart attack Paternal Grandfather     Social History   Socioeconomic History  . Marital status: Married    Spouse name: [name]  . Number of children: 3  . Years of education: 53  . 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 Smoking status: Never Smoker  . Smokeless tobacco: Never Used  Vaping Use  . Vaping Use: Never used  Substance and Sexual Activity  . Alcohol use: Yes    Alcohol/week: 0.0 standard drinks    Comment: occasionally  . Drug use: No  . Sexual activity: Yes    Partners: Male    Birth control/protection: Surgical    Comment: tubaligation  Other Topics Concern  . Not on file  Social History Narrative   ** Merged History Encounter **       ** Data from: 02/09/14 Enc Dept: [facility]       ** Data from: 11/10/13 Enc Dept: [facility]   Patient lives at home with [name]   Patient works full time.   Right handed.   Caffeine- none   Education- high school   Social Determinants of Health   Financial Resource Strain: Not on file  Food Insecurity: Not on file  Transportation Needs: Not on file  Physical Activity: Not on file  Stress: Not on file  Social Connections: Not on file  Intimate Partner Violence: Not on file    Past Medical History, Surgical history, Social history, and Family history were reviewed and updated as appropriate.   Please see review of systems for further details on the patient's review from today.   Objective:   Physical Exam:  There were no vitals taken for this visit.  Physical Exam Constitutional:      General: She is not in acute distress. Musculoskeletal:        General: No deformity.  Neurological:     Mental Status: She is alert and oriented to person, place, and time.     Coordination: Coordination normal.  Psychiatric:        Attention and Perception: Attention and perception normal. She does not perceive auditory or visual hallucinations.        Mood and Affect: Mood normal. Mood is not anxious or depressed. Affect is not labile, blunt, angry or inappropriate.        Speech: Speech normal.        Behavior: Behavior normal.        Thought Content: Thought content normal. Thought  content is not paranoid or delusional. Thought content does not include homicidal or suicidal ideation. Thought content does not include homicidal or suicidal plan.        Cognition and Memory: Cognition and memory normal.        Judgment: Judgment normal.     Comments: Insight intact     Lab Review:     Component Value Date/Time   NA 140 05/14/2020 1532   NA 139  03/03/2013 1545   K 4.0 05/14/2020 1532   K 3.7 03/03/2013 1545   CL 104 05/14/2020 1532   CL 105 03/03/2013 1545   CO2 27 05/14/2020 1532   CO2 28 03/03/2013 1545   GLUCOSE 82 05/14/2020 1532   GLUCOSE 106 (H) 03/03/2013 1545   BUN 10 05/14/2020 1532   BUN 11 03/03/2013 1545   CREATININE 0.89 05/14/2020 1532   CALCIUM 9.6 05/14/2020 1532   CALCIUM 8.8 03/03/2013 1545   PROT 6.8 05/14/2020 1532   PROT 7.3 03/03/2013 1545   ALBUMIN 3.8 03/03/2013 1545   AST 13 05/14/2020 1532   AST 8 (L) 03/03/2013 1545   ALT 16 05/14/2020 1532   ALT 15 03/03/2013 1545   ALKPHOS 41 (L) 03/03/2013 1545   BILITOT 0.6 05/14/2020 1532   BILITOT 0.4 03/03/2013 1545   GFRNONAA 79 05/14/2020 1532   GFRAA 91 05/14/2020 1532       Component Value Date/Time   WBC 8.1 05/14/2020 1532   RBC 4.87 05/14/2020 1532   HGB 15.0 05/14/2020 1532   HGB 13.8 03/03/2013 1545   HCT 42.9 05/14/2020 1532   HCT 40.2 03/03/2013 1545   PLT 329 05/14/2020 1532   PLT 288 03/03/2013 1545   MCV 88.1 05/14/2020 1532   MCV 85 03/03/2013 1545   MCH 30.8 05/14/2020 1532   MCHC 35.0 05/14/2020 1532   RDW 12.9 05/14/2020 1532   RDW 13.3 03/03/2013 1545   LYMPHSABS 3,475 05/14/2020 1532   LYMPHSABS 2.9 03/03/2013 1545   MONOABS 752 01/20/2016 1603   MONOABS 0.6 03/03/2013 1545   EOSABS 219 05/14/2020 1532   EOSABS 0.2 03/03/2013 1545   BASOSABS 49 05/14/2020 1532   BASOSABS 0.0 03/03/2013 1545    No results found for: POCLITH, LITHIUM   No results found for: PHENYTOIN, PHENOBARB, VALPROATE, CBMZ   .res Assessment: Plan:     Plan:  1. Latuda  56m daily  Read and reviewed note with patient for accuracy.   RTC 6 months  Patient advised to contact office with any questions, adverse effects, or acute worsening in signs and symptoms.  Discussed potential metabolic side effects associated with atypical antipsychotics, as well as potential risk for movement side effects. Advised pt to contact office if movement side effects occur.     There are no diagnoses linked to this encounter.   Please see After Visit Summary for patient specific instructions.  Future Appointments  Date Time Provider [place] 07/27/2020  3:30 PM WLucilla Lame MD [facility] None  08/30/2020  3:00 PM [facility] NURSE [facility]    No orders of the defined types were placed in this encounter.   -------------------------------

## 2020-06-21 ENCOUNTER — Other Ambulatory Visit: Payer: Self-pay

## 2020-06-21 DIAGNOSIS — R0600 Dyspnea, unspecified: Secondary | ICD-10-CM

## 2020-06-21 MED ORDER — PANTOPRAZOLE SODIUM 40 MG PO TBEC: 40.0000 mg | DELAYED_RELEASE_TABLET | Freq: Every day | ORAL | 1 refills | Status: DC

## 2020-06-23 ENCOUNTER — Encounter: Payer: Self-pay | Admitting: Family Medicine

## 2020-07-26 NOTE — Progress Notes (Signed)
Primary Care Physician: [name], PA-C  Primary Gastroenterologist:  Dr. Lucilla Lame  Chief Complaint  Patient presents with  . Medication Refill    HPI: Anita Weaver is a 45 y.o. female here with a history of heartburn.  The patient had been started on Dexilant and was having significant acid breakthrough. The patient was then started on pantoprazole 40 mg twice a day and was symptom free and was recommended to try and decrease the dose to once a day.  The patient had a refill of the medication in March of this year.  The patient had last been seen in February 2021. The patient reports that her heartburn is well controlled and when she does have acid breakthrough she will take over-the-counter omeprazole.  She also reports that she has irritable bowel syndrome with constipation predominance.  There is no report of any unexplained weight loss fevers chills nausea vomiting black stools or bloody stools. The patient is also on a diet now trying to lose weight with her husband.  Past Medical History:  Diagnosis Date  . Allergy   . Arthritis   . Asthma   . Chronic pelvic pain in female   . Endometriosis   . FH: breast cancer in first degree relative    Mother and sister both are BRCA positive; patient is BRCA negative  . GERD (gastroesophageal reflux disease)   . Headache    migraines  . Heart murmur   . Hyperlipidemia   . Hypertension   . Interstitial cystitis   . Irritable bowel disease   . Neck pain     Current Outpatient Medications  Medication Sig Dispense Refill  . albuterol (PROAIR HFA) 108 (90 Base) MCG/ACT inhaler Inhale 2 puffs into the lungs every 4 (four) hours as needed for wheezing or shortness of breath. 18 g 2  . lurasidone (LATUDA) 40 MG TABS tablet TAKE 1 TABLET (40 MG TOTAL) BY MOUTH DAILY WITH BREAKFAST. 30 tablet 5  . medroxyPROGESTERone (DEPO-PROVERA) 150 MG/ML injection Inject 1 mL (150 mg total) into the muscle every 3 (three) months. 1 mL 3  .  nortriptyline (PAMELOR) 10 MG capsule Take 40 mg by mouth at bedtime.    . ondansetron (ZOFRAN) 4 MG tablet Take one tablet every 8 hours as needed. #10 to last 30 days. For migraines. 10 tablet 5  . pantoprazole (PROTONIX) 40 MG tablet Take 1 tablet (40 mg total) by mouth daily. 90 tablet 1  . rizatriptan (MAXALT) 10 MG tablet Take 1 tablet (10 mg total) by mouth as needed for migraine. May repeat in 2 hours if needed 10 tablet 3   No current facility-administered medications for this visit.    Allergies as of 07/27/2020 - Review Complete 07/27/2020  Allergen Reaction Noted  . Imitrex [sumatriptan] Other (See Comments) 04/24/2014  . Demerol [meperidine] Other (See Comments) 04/18/2012  . Flagyl [metronidazole] Hives 04/15/2015    ROS:  General: Negative for anorexia, weight loss, fever, chills, fatigue, weakness. ENT: Negative for hoarseness, difficulty swallowing , nasal congestion. CV: Negative for chest pain, angina, palpitations, dyspnea on exertion, peripheral edema.  Respiratory: Negative for dyspnea at rest, dyspnea on exertion, cough, sputum, wheezing.  GI: See history of present illness. GU:  Negative for dysuria, hematuria, urinary incontinence, urinary frequency, nocturnal urination.  Endo: Negative for unusual weight change.    Physical Examination:   BP (!) 131/94   Pulse (!) 121   Temp 98.3 F (36.8 C) (Oral)   Ht  _0  (1.727 m)   Wt 214 lb (97.1 kg)   BMI 32.54 kg/m   General: Well-nourished, well-developed in no acute distress.  Eyes: No icterus. Conjunctivae pink. Lungs: Clear to auscultation bilaterally. Non-labored. Heart: Regular rate and rhythm, no murmurs rubs or gallops.  Abdomen: Bowel sounds are normal, nontender, nondistended, no hepatosplenomegaly or masses, no abdominal bruits or hernia , no rebound or guarding.   Extremities: No lower extremity edema. No clubbing or deformities. Neuro: Alert and oriented x 3.  Grossly intact. Skin: Warm and  dry, no jaundice.   Psych: Alert and cooperative, normal mood and affect.  Labs:    Imaging Studies: No results found.  Assessment and Plan:   Anita Weaver is a 46 y.o. y/o female  who comes in today with a report of chronic constipation and has been taking nortriptyline which has made her constipation slightly worse.  The patient has been given samples of MiraLAX and has been told to take the MiraLAX daily to help with her bowel movements.  The patient has also been told to continue her Protonix once a day since it seems to be working for her and to supplement it with the 20 mg of omeprazole every so often when she is having acid breakthrough.  The patient has been explained the plan and agrees with it.     Lucilla Lame, MD. [name]    Note: This dictation was prepared with Dragon dictation along with smaller phrase technology. Any transcriptional errors that result from this process are unintentional.

## 2020-07-27 ENCOUNTER — Other Ambulatory Visit: Payer: Self-pay

## 2020-07-27 ENCOUNTER — Encounter: Payer: Self-pay | Admitting: Gastroenterology

## 2020-07-27 ENCOUNTER — Ambulatory Visit: Payer: 59 | Admitting: Gastroenterology

## 2020-07-27 VITALS — BP 131/94 | HR 121 | Temp 98.3°F | Ht 68.0 in | Wt 214.0 lb

## 2020-07-27 DIAGNOSIS — K219 Gastro-esophageal reflux disease without esophagitis: Secondary | ICD-10-CM

## 2020-08-30 ENCOUNTER — Ambulatory Visit (INDEPENDENT_AMBULATORY_CARE_PROVIDER_SITE_OTHER): Payer: 59

## 2020-08-30 ENCOUNTER — Other Ambulatory Visit: Payer: Self-pay

## 2020-08-30 VITALS — BP 129/87 | HR 112

## 2020-08-30 DIAGNOSIS — Z3042 Encounter for surveillance of injectable contraceptive: Secondary | ICD-10-CM | POA: Diagnosis not present

## 2020-08-30 MED ORDER — MEDROXYPROGESTERONE ACETATE 150 MG/ML IM SUSP
150.0000 mg | Freq: Once | INTRAMUSCULAR | Status: AC
  Administered 2020-08-30: 150 mg via INTRAMUSCULAR

## 2020-08-30 NOTE — Progress Notes (Signed)
Date last pap: 02/2020 Last Depo-Provera: 06/14/2020 Side Effects if any: None Serum HCG indicated? NA Depo-Provera 150 mg IM given by: Mickey Farber, CMA Next appointment due: Aug 8th- Aug 22

## 2020-09-07 NOTE — Progress Notes (Signed)
Patient was assessed and managed by nursing staff during this encounter. I have reviewed the chart and agree with the documentation and plan. I have also made any necessary editorial changes.  Aletha Halim, MD 09/07/2020 9:16 PM

## 2020-11-15 ENCOUNTER — Ambulatory Visit: Payer: 59

## 2020-11-17 ENCOUNTER — Ambulatory Visit (INDEPENDENT_AMBULATORY_CARE_PROVIDER_SITE_OTHER): Payer: 59 | Admitting: *Deleted

## 2020-11-17 ENCOUNTER — Other Ambulatory Visit: Payer: Self-pay

## 2020-11-17 VITALS — BP 137/94 | HR 112

## 2020-11-17 DIAGNOSIS — Z3042 Encounter for surveillance of injectable contraceptive: Secondary | ICD-10-CM

## 2020-11-17 MED ORDER — MEDROXYPROGESTERONE ACETATE 150 MG/ML IM SUSP
150.0000 mg | Freq: Once | INTRAMUSCULAR | Status: AC
  Administered 2020-11-17: 150 mg via INTRAMUSCULAR

## 2020-11-17 NOTE — Progress Notes (Signed)
Date last pap: 12/05/2016-Dr Rivard. Last Depo-Provera: 08/30/2020. Side Effects if any: None. Serum HCG indicated? NA. Depo-Provera 150 mg IM given by: Crosby Oyster, RN . Next appointment due Oct 26-Nov 9.

## 2020-11-22 ENCOUNTER — Ambulatory Visit: Payer: 59 | Admitting: Family Medicine

## 2020-11-22 NOTE — Progress Notes (Signed)
Attestation of Attending Supervision of clinical support staff: I agree with the care provided to this patient and was available for any consultation.  I have reviewed the RN's note and chart. I was available for consult and to see the patient if needed.   Daysha Ashmore MD MPH Attending Physician Faculty Practice- [facility]  

## 2020-11-29 ENCOUNTER — Encounter: Payer: Self-pay | Admitting: Family Medicine

## 2020-11-29 ENCOUNTER — Other Ambulatory Visit: Payer: Self-pay

## 2020-11-29 ENCOUNTER — Ambulatory Visit: Payer: 59 | Admitting: Family Medicine

## 2020-11-29 VITALS — BP 132/86 | HR 106 | Temp 98.4°F | Resp 16 | Ht 68.0 in | Wt 211.0 lb

## 2020-11-29 DIAGNOSIS — M79604 Pain in right leg: Secondary | ICD-10-CM | POA: Diagnosis not present

## 2020-11-29 DIAGNOSIS — M79605 Pain in left leg: Secondary | ICD-10-CM | POA: Diagnosis not present

## 2020-11-29 DIAGNOSIS — M545 Low back pain, unspecified: Secondary | ICD-10-CM

## 2020-11-29 DIAGNOSIS — R350 Frequency of micturition: Secondary | ICD-10-CM | POA: Diagnosis not present

## 2020-11-29 NOTE — Progress Notes (Signed)
Patient ID: Anita COGLIANESE, female    DOB: 1975/07/09, 45 y.o.   MRN: [id number]  PCP: [name], PA-C  Chief Complaint  Patient presents with   Leg Pain    Bilateral for years, no injuries or new activities    Subjective:   Anita Weaver is a 45 y.o. female, presents to clinic with CC of the following:  HPI  Pt presents complaining of leg pain for years  Hx of scoliosis deformity of spine She reports sharp pain down front of both shins and legs comes and goes, sudden onset, usually occurs once a week, she takes tylenol and warm baths to help ease it - worse in L shin than R recently more pain from hips to anterior thighs more achy, onset a few months ago, no radiation from back or buttocks, ache and slight burn, no numbness,  No change to LE skin, appearance color muscle mass no weakness    IC dx in the past - but she feels like she is unable to empty bladder completely and she wants a urology referral, not on meds for IC, no hematuria     Patient Active Problem List   Diagnosis Date Noted   Headache disorder 03/23/2020   COVID-19 12/30/2019   Arthritis 07/09/2018   Chronic interstitial cystitis 07/09/2018   Irritable bowel syndrome 07/09/2018   Scoliosis deformity of spine 07/09/2018   Asthma 04/27/2017   Acid reflux 04/27/2017   Hiatal hernia with GERD 04/27/2017   Contact dermatitis 05/15/2016   Fatigue 01/20/2016   Mild dysplasia of cervix (CIN I) 09/29/2014   Pap smear abnormality of cervix with LGSIL 09/21/2014   FH: genetic disease carrier 04/22/2014   Migraine headache 05/08/2013   Bipolar disorder, unspecified ([place]) 05/08/2013   Endometriosis 04/18/2012   Chronic pelvic pain in female 04/18/2012   Female genital symptoms 04/18/2012      Current Outpatient Medications:    albuterol (PROAIR HFA) 108 (90 Base) MCG/ACT inhaler, Inhale 2 puffs into the lungs every 4 (four) hours as needed for wheezing or shortness of breath., Disp: 18 g, Rfl: 2    lurasidone (LATUDA) 40 MG TABS tablet, TAKE 1 TABLET (40 MG TOTAL) BY MOUTH DAILY WITH BREAKFAST., Disp: 30 tablet, Rfl: 5   medroxyPROGESTERone (DEPO-PROVERA) 150 MG/ML injection, Inject 1 mL (150 mg total) into the muscle every 3 (three) months., Disp: 1 mL, Rfl: 3   nortriptyline (PAMELOR) 10 MG capsule, Take 40 mg by mouth at bedtime., Disp: , Rfl:    ondansetron (ZOFRAN) 4 MG tablet, Take one tablet every 8 hours as needed. #10 to last 30 days. For migraines., Disp: 10 tablet, Rfl: 5   pantoprazole (PROTONIX) 40 MG tablet, Take 1 tablet (40 mg total) by mouth daily., Disp: 90 tablet, Rfl: 1   rizatriptan (MAXALT) 10 MG tablet, Take 1 tablet (10 mg total) by mouth as needed for migraine. May repeat in 2 hours if needed, Disp: 10 tablet, Rfl: 3   Allergies  Allergen Reactions   Imitrex [Sumatriptan] Other (See Comments)    Heart palpitations, hot/cold sweats   Demerol [Meperidine] Other (See Comments)    hallucinations   Flagyl [Metronidazole] Hives     Social History   Tobacco Use   Smoking status: Never   Smokeless tobacco: Never  Vaping Use   Vaping Use: Never used  Substance Use Topics   Alcohol use: Yes    Alcohol/week: 0.0 standard drinks    Comment: occasionally   Drug  use: No      Chart Review Today: I personally reviewed active problem list, medication list, allergies, family history, social history, health maintenance, notes from last encounter, lab results, imaging with the patient/caregiver today.   Review of Systems  Constitutional: Negative.   HENT: Negative.    Eyes: Negative.   Respiratory: Negative.    Cardiovascular: Negative.   Gastrointestinal: Negative.   Endocrine: Negative.   Genitourinary: Negative.   Musculoskeletal: Negative.   Skin: Negative.   Allergic/Immunologic: Negative.   Neurological: Negative.   Hematological: Negative.   Psychiatric/Behavioral: Negative.    All other systems reviewed and are negative.     Objective:    Vitals:   11/29/20 1503  BP: 132/86  Pulse: (!) 106  Resp: 16  Temp: 98.4 F (36.9 C)  SpO2: 97%  Weight: 211 lb (95.7 kg)  Height: '5\' 8"'$  (1.727 m)    Body mass index is 32.08 kg/m.  Physical Exam Vitals and nursing note reviewed.  Constitutional:      General: She is not in acute distress.    Appearance: Normal appearance. She is obese. She is not ill-appearing, toxic-appearing or diaphoretic.  HENT:     Head: Normocephalic and atraumatic.     Right Ear: External ear normal.     Left Ear: External ear normal.  Cardiovascular:     Rate and Rhythm: Normal rate and regular rhythm.     Pulses: Normal pulses.     Heart sounds: Normal heart sounds.  Pulmonary:     Effort: Pulmonary effort is normal.     Breath sounds: Normal breath sounds.  Abdominal:     General: Bowel sounds are normal.     Palpations: Abdomen is soft.  Musculoskeletal:     Cervical back: Normal.     Thoracic back: Normal.     Lumbar back: Normal. No deformity, spasms, tenderness or bony tenderness. Negative right straight leg raise test and negative left straight leg raise test.     Right hip: Normal. No deformity, lacerations, tenderness, bony tenderness or crepitus. Normal range of motion. Normal strength.     Left hip: Normal. No deformity, lacerations, tenderness, bony tenderness or crepitus. Normal range of motion. Normal strength.     Right upper leg: Normal. No swelling, edema, deformity, lacerations, tenderness or bony tenderness.     Left upper leg: Normal. No swelling, edema, deformity, lacerations, tenderness or bony tenderness.     Right lower leg: Normal. No swelling, deformity, tenderness or bony tenderness. No edema.     Left lower leg: Normal. No swelling, deformity, tenderness or bony tenderness. No edema.     Right ankle: Normal.     Left ankle: Normal.     Comments: Muscle compartments to b/l thighs and LE normal, no ttp on exam  Skin:    Coloration: Skin is not jaundiced or pale.      Findings: No lesion.  Neurological:     Mental Status: She is alert. Mental status is at baseline.     Sensory: No sensory deficit.     Gait: Gait normal.  Psychiatric:        Mood and Affect: Mood normal.        Behavior: Behavior normal.     Results for orders placed or performed in visit on 05/14/20  CBC with Differential/Platelet  Result Value Ref Range   WBC 8.1 3.8 - 10.8 Thousand/uL   RBC 4.87 3.80 - 5.10 Million/uL   Hemoglobin 15.0 11.7 -  15.5 g/dL   HCT 42.9 35.0 - 45.0 %   MCV 88.1 80.0 - 100.0 fL   MCH 30.8 27.0 - 33.0 pg   MCHC 35.0 32.0 - 36.0 g/dL   RDW 12.9 11.0 - 15.0 %   Platelets 329 140 - 400 Thousand/uL   MPV 8.9 7.5 - 12.5 fL   Neutro Abs 3,799 1,500 - 7,800 cells/uL   Lymphs Abs 3,475 850 - 3,900 cells/uL   Absolute Monocytes 559 200 - 950 cells/uL   Eosinophils Absolute 219 15 - 500 cells/uL   Basophils Absolute 49 0 - 200 cells/uL   Neutrophils Relative % 46.9 %   Total Lymphocyte 42.9 %   Monocytes Relative 6.9 %   Eosinophils Relative 2.7 %   Basophils Relative 0.6 %  COMPLETE METABOLIC PANEL WITH GFR  Result Value Ref Range   Glucose, Bld 82 65 - 99 mg/dL   BUN 10 7 - 25 mg/dL   Creat 0.89 0.50 - 1.10 mg/dL   GFR, Est Non African American 79 > OR = 60 mL/min/1.79m   GFR, Est African American 91 > OR = 60 mL/min/1.753m  BUN/Creatinine Ratio NOT APPLICABLE 6 - 22 (calc)   Sodium 140 135 - 146 mmol/L   Potassium 4.0 3.5 - 5.3 mmol/L   Chloride 104 98 - 110 mmol/L   CO2 27 20 - 32 mmol/L   Calcium 9.6 8.6 - 10.2 mg/dL   Total Protein 6.8 6.1 - 8.1 g/dL   Albumin 4.2 3.6 - 5.1 g/dL   Globulin 2.6 1.9 - 3.7 g/dL (calc)   AG Ratio 1.6 1.0 - 2.5 (calc)   Total Bilirubin 0.6 0.2 - 1.2 mg/dL   Alkaline phosphatase (APISO) 41 31 - 125 U/L   AST 13 10 - 30 U/L   ALT 16 6 - 29 U/L  Lipid panel  Result Value Ref Range   Cholesterol 179 <200 mg/dL   HDL 34 (L) > OR = 50 mg/dL   Triglycerides 168 (H) <150 mg/dL   LDL Cholesterol (Calc) 117  (H) mg/dL (calc)   Total CHOL/HDL Ratio 5.3 (H) <5.0 (calc)   Non-HDL Cholesterol (Calc) 145 (H) <130 mg/dL (calc)  TSH  Result Value Ref Range   TSH 2.00 mIU/L  HIV Antibody (routine testing w rflx)  Result Value Ref Range   HIV 1&2 Ab, 4th Generation NON-REACTIVE NON-REACTI  Hepatitis C antibody  Result Value Ref Range   Hepatitis C Ab NON-REACTIVE NON-REACTI   SIGNAL TO CUT-OFF 0.01 <1.00  Iron, TIBC and Ferritin Panel  Result Value Ref Range   Iron 67 40 - 190 mcg/dL   TIBC 260 250 - 450 mcg/dL (calc)   %SAT 26 16 - 45 % (calc)   Ferritin 58 16 - 232 ng/mL       Assessment & Plan:     ICD-10-CM   1. Urinary frequency  R35.0 Ambulatory referral to Urology   frequency and she feels like she is retaining urine, wants urology referral, prior dx of interstitial cystitis, she doesn't feel like this is that    2. Leg pain, bilateral  M79.604 DG HIPS BILAT WITH PELVIS 3-4 VIEWS   M79.605 DG Lumbar Spine Complete    DG Hip Unilat W OR W/O Pelvis 2-3 Views Right    DG Hip Unilat W OR W/O Pelvis 2-3 Views Left   bilateral anterior shins w no alleviating or aggravating factors, no ttp on exam, do not feel xrays would be helpful - PT?  sports med? ortho?    3. Right-sided low back pain without sciatica, unspecified chronicity  M54.50 DG HIPS BILAT WITH PELVIS 3-4 VIEWS    DG Lumbar Spine Complete    DG Hip Unilat W OR W/O Pelvis 2-3 Views Right    DG Hip Unilat W OR W/O Pelvis 2-3 Views Left   some low back SI joint area ttp w/ neg SLR, no ttp to hip or thigh, no injury or explanation for pain, xrays to check for DDD? arthritis? ortho referral          [name], PA-C 11/29/20 2:23 PM

## 2020-12-01 ENCOUNTER — Ambulatory Visit
Admission: RE | Admit: 2020-12-01 | Discharge: 2020-12-01 | Disposition: A | Discharge status: Home / Self Care | Payer: 59 | Source: Ambulatory Visit | Attending: Family Medicine | Admitting: Family Medicine

## 2020-12-01 ENCOUNTER — Ambulatory Visit
Admission: RE | Admit: 2020-12-01 | Discharge: 2020-12-01 | Disposition: A | Discharge status: Home / Self Care | Payer: 59 | Attending: Family Medicine | Admitting: Family Medicine

## 2020-12-01 ENCOUNTER — Other Ambulatory Visit: Payer: Self-pay

## 2020-12-01 DIAGNOSIS — M79605 Pain in left leg: Secondary | ICD-10-CM

## 2020-12-01 DIAGNOSIS — M79604 Pain in right leg: Secondary | ICD-10-CM | POA: Diagnosis not present

## 2020-12-01 DIAGNOSIS — M545 Low back pain, unspecified: Secondary | ICD-10-CM | POA: Insufficient documentation

## 2020-12-20 ENCOUNTER — Telehealth: Payer: Self-pay | Admitting: Adult Health

## 2020-12-20 ENCOUNTER — Other Ambulatory Visit: Payer: Self-pay

## 2020-12-20 DIAGNOSIS — G4700 Insomnia, unspecified: Secondary | ICD-10-CM

## 2020-12-20 DIAGNOSIS — F3130 Bipolar disorder, current episode depressed, mild or moderate severity, unspecified: Secondary | ICD-10-CM

## 2020-12-20 MED ORDER — LURASIDONE HCL 40 MG PO TABS: ORAL_TABLET | ORAL | 0 refills | Status: DC

## 2020-12-20 NOTE — Telephone Encounter (Signed)
Rx sent 

## 2020-12-20 NOTE — Telephone Encounter (Signed)
Pt needs a refill on her latuda 40 mg to [facility] [place]. Next appt  is 9/12

## 2020-12-22 ENCOUNTER — Ambulatory Visit: Payer: 59 | Admitting: Urology

## 2020-12-27 ENCOUNTER — Other Ambulatory Visit: Payer: Self-pay

## 2020-12-27 ENCOUNTER — Encounter: Payer: Self-pay | Admitting: Adult Health

## 2020-12-27 ENCOUNTER — Ambulatory Visit: Payer: 59 | Admitting: Adult Health

## 2020-12-27 DIAGNOSIS — G4700 Insomnia, unspecified: Secondary | ICD-10-CM

## 2020-12-27 DIAGNOSIS — F3130 Bipolar disorder, current episode depressed, mild or moderate severity, unspecified: Secondary | ICD-10-CM | POA: Diagnosis not present

## 2020-12-27 MED ORDER — LURASIDONE HCL 40 MG PO TABS: ORAL_TABLET | ORAL | 3 refills | Status: DC

## 2020-12-27 NOTE — Progress Notes (Signed)
Anita Weaver [id number] 17-Aug-1975 45 y.o.  Subjective:   Patient ID:  Anita Weaver is a 45 y.o. (DOB [birth year]) female.  Chief Complaint: No chief complaint on file.   HPI Anita Weaver presents to the office today for follow-up of BPD 1 and insomnia.  Accompanied by husband  Describes mood today as "ok". Pleasant. Mood symptoms - denies depression, anxiety, and irritability. Mood level. Stating "I'm doing pretty good". Feels like the Latuda at 40mg  daily continues to work well. She and husband doing well. Grandson in hospital, but doing better - 2 months old - hospital for one month. Stable interest and motivation. Taking medications as prescribed.  Energy levels stable. Active, does not have a regular exercise routine.  Enjoys some usual interests and activities. Married. Lives with husband of 8 years. Has 3 grown sons - 3 grandchildren - sons. Parents live in [place]. Appetite adequate. Weight loss - 205 pounds - 5\' 8" . Sleeps well most nights. Averages 8 to 9 hours. Focus and concentration stable. Completing tasks. Managing aspects of household. Works full time as a [employer] - 4 to 5 years.  Denies SI or HI.  Denies AH or VH.   PHQ2-9    Flowsheet Row Office Visit from 11/29/2020 in [facility] Video Visit from 05/14/2020 in [facility] Video Visit from 01/07/2020 in [facility] Video Visit from 12/30/2019 in [facility] Office Visit from 11/12/2019 in [facility]  PHQ-2 Total Score 0 0 0 0 0  PHQ-9 Total Score 0 -- -- -- 0        Review of Systems:  Review of Systems  Musculoskeletal:  Negative for gait problem.  Neurological:  Negative for tremors.  Psychiatric/Behavioral:         Please refer to HPI   Medications: I have reviewed the patient's current medications.  Current Outpatient Medications  Medication Sig Dispense Refill   albuterol (PROAIR HFA) 108 (90 Base)  MCG/ACT inhaler Inhale 2 puffs into the lungs every 4 (four) hours as needed for wheezing or shortness of breath. 18 g 2   lurasidone (LATUDA) 40 MG TABS tablet TAKE 1 TABLET (40 MG TOTAL) BY MOUTH DAILY WITH BREAKFAST. 90 tablet 3   medroxyPROGESTERone (DEPO-PROVERA) 150 MG/ML injection Inject 1 mL (150 mg total) into the muscle every 3 (three) months. 1 mL 3   nortriptyline (PAMELOR) 10 MG capsule Take 40 mg by mouth at bedtime.     ondansetron (ZOFRAN) 4 MG tablet Take one tablet every 8 hours as needed. #10 to last 30 days. For migraines. 10 tablet 5   pantoprazole (PROTONIX) 40 MG tablet Take 1 tablet (40 mg total) by mouth daily. 90 tablet 1   rizatriptan (MAXALT) 10 MG tablet Take 1 tablet (10 mg total) by mouth as needed for migraine. May repeat in 2 hours if needed 10 tablet 3   No current facility-administered medications for this visit.    Medication Side Effects: None  Allergies:  Allergies  Allergen Reactions   Imitrex [Sumatriptan] Other (See Comments)    Heart palpitations, hot/cold sweats   Demerol [Meperidine] Other (See Comments)    hallucinations   Flagyl [Metronidazole] Hives    Past Medical History:  Diagnosis Date   Allergy    Arthritis    Asthma    Chronic pelvic pain in female    Endometriosis    FH: breast cancer in first degree relative  Mother and sister both are BRCA positive; patient is BRCA negative   GERD (gastroesophageal reflux disease)    Headache    migraines   Heart murmur    Hyperlipidemia    Hypertension    Interstitial cystitis    Irritable bowel disease    Neck pain     Past Medical History, Surgical history, Social history, and Family history were reviewed and updated as appropriate.   Please see review of systems for further details on the patient's review from today.   Objective:   Physical Exam:  There were no vitals taken for this visit.  Physical Exam Constitutional:      General: She is not in acute  distress. Musculoskeletal:        General: No deformity.  Neurological:     Mental Status: She is alert and oriented to person, place, and time.     Coordination: Coordination normal.  Psychiatric:        Attention and Perception: Attention and perception normal. She does not perceive auditory or visual hallucinations.        Mood and Affect: Mood normal. Mood is not anxious or depressed. Affect is not labile, blunt, angry or inappropriate.        Speech: Speech normal.        Behavior: Behavior normal.        Thought Content: Thought content normal. Thought content is not paranoid or delusional. Thought content does not include homicidal or suicidal ideation. Thought content does not include homicidal or suicidal plan.        Cognition and Memory: Cognition and memory normal.        Judgment: Judgment normal.     Comments: Insight intact    Lab Review:     Component Value Date/Time   NA 140 05/14/2020 1532   NA 139 03/03/2013 1545   K 4.0 05/14/2020 1532   K 3.7 03/03/2013 1545   CL 104 05/14/2020 1532   CL 105 03/03/2013 1545   CO2 27 05/14/2020 1532   CO2 28 03/03/2013 1545   GLUCOSE 82 05/14/2020 1532   GLUCOSE 106 (H) 03/03/2013 1545   BUN 10 05/14/2020 1532   BUN 11 03/03/2013 1545   CREATININE 0.89 05/14/2020 1532   CALCIUM 9.6 05/14/2020 1532   CALCIUM 8.8 03/03/2013 1545   PROT 6.8 05/14/2020 1532   PROT 7.3 03/03/2013 1545   ALBUMIN 3.8 03/03/2013 1545   AST 13 05/14/2020 1532   AST 8 (L) 03/03/2013 1545   ALT 16 05/14/2020 1532   ALT 15 03/03/2013 1545   ALKPHOS 41 (L) 03/03/2013 1545   BILITOT 0.6 05/14/2020 1532   BILITOT 0.4 03/03/2013 1545   GFRNONAA 79 05/14/2020 1532   GFRAA 91 05/14/2020 1532       Component Value Date/Time   WBC 8.1 05/14/2020 1532   RBC 4.87 05/14/2020 1532   HGB 15.0 05/14/2020 1532   HGB 13.8 03/03/2013 1545   HCT 42.9 05/14/2020 1532   HCT 40.2 03/03/2013 1545   PLT 329 05/14/2020 1532   PLT 288 03/03/2013 1545   MCV  88.1 05/14/2020 1532   MCV 85 03/03/2013 1545   MCH 30.8 05/14/2020 1532   MCHC 35.0 05/14/2020 1532   RDW 12.9 05/14/2020 1532   RDW 13.3 03/03/2013 1545   LYMPHSABS 3,475 05/14/2020 1532   LYMPHSABS 2.9 03/03/2013 1545   MONOABS 752 01/20/2016 1603   MONOABS 0.6 03/03/2013 1545   EOSABS 219 05/14/2020 1532   EOSABS  0.2 03/03/2013 1545   BASOSABS 49 05/14/2020 1532   BASOSABS 0.0 03/03/2013 1545    No results found for: POCLITH, LITHIUM   No results found for: PHENYTOIN, PHENOBARB, VALPROATE, CBMZ   .res Assessment: Plan:    Plan:  1. Latuda $RemoveB'40mg'[id number]$  daily - 90 days sent with copay card given.  RTC 6 months  Patient advised to contact office with any questions, adverse effects, or acute worsening in signs and symptoms.  Discussed potential metabolic side effects associated with atypical antipsychotics, as well as potential risk for movement side effects. Advised pt to contact office if movement side effects occur.    Diagnoses and all orders for this visit:  Bipolar I disorder, most recent episode depressed (HCC) -     lurasidone (LATUDA) 40 MG TABS tablet; TAKE 1 TABLET (40 MG TOTAL) BY MOUTH DAILY WITH BREAKFAST.  Insomnia, unspecified type -     lurasidone (LATUDA) 40 MG TABS tablet; TAKE 1 TABLET (40 MG TOTAL) BY MOUTH DAILY WITH BREAKFAST.    Please see After Visit Summary for patient specific instructions.  Future Appointments  Date Time Provider [place]  02/14/2021  3:00 PM [facility] NURSE [facility]  06/27/2021  4:20 PM Wendal Wilkie, Berdie Ogren, NP [facility] None    No orders of the defined types were placed in this encounter.   -------------------------------

## 2021-01-24 ENCOUNTER — Other Ambulatory Visit: Payer: Self-pay

## 2021-01-24 ENCOUNTER — Telehealth: Payer: Self-pay | Admitting: Gastroenterology

## 2021-01-24 DIAGNOSIS — R0600 Dyspnea, unspecified: Secondary | ICD-10-CM

## 2021-01-24 MED ORDER — PANTOPRAZOLE SODIUM 40 MG PO TBEC: 40.0000 mg | DELAYED_RELEASE_TABLET | Freq: Every day | ORAL | 1 refills | Status: DC

## 2021-01-24 NOTE — Telephone Encounter (Signed)
Responded to pt's mychart message regarding needing a refill her on Pantoprazole. Rx sent today.

## 2021-01-24 NOTE — Telephone Encounter (Signed)
Pt. Calling she says her Pharmacy has reached out to get a refill for her protonix but no response. She is requesting a call back to see if there is an issue with sending prescription.

## 2021-01-26 ENCOUNTER — Other Ambulatory Visit: Payer: Self-pay | Admitting: Gastroenterology

## 2021-01-26 DIAGNOSIS — R0600 Dyspnea, unspecified: Secondary | ICD-10-CM

## 2021-01-27 ENCOUNTER — Other Ambulatory Visit: Payer: Self-pay

## 2021-01-27 DIAGNOSIS — R0600 Dyspnea, unspecified: Secondary | ICD-10-CM

## 2021-01-27 MED ORDER — PANTOPRAZOLE SODIUM 40 MG PO TBEC: 40.0000 mg | DELAYED_RELEASE_TABLET | Freq: Every day | ORAL | 1 refills | Status: DC

## 2021-02-14 ENCOUNTER — Other Ambulatory Visit: Payer: Self-pay

## 2021-02-14 ENCOUNTER — Ambulatory Visit (INDEPENDENT_AMBULATORY_CARE_PROVIDER_SITE_OTHER): Payer: 59 | Admitting: *Deleted

## 2021-02-14 VITALS — BP 144/94 | HR 108

## 2021-02-14 DIAGNOSIS — Z3042 Encounter for surveillance of injectable contraceptive: Secondary | ICD-10-CM | POA: Diagnosis not present

## 2021-02-14 MED ORDER — MEDROXYPROGESTERONE ACETATE 150 MG/ML IM SUSP
150.0000 mg | Freq: Once | INTRAMUSCULAR | Status: AC
  Administered 2021-02-14: 150 mg via INTRAMUSCULAR

## 2021-02-14 MED ORDER — MEDROXYPROGESTERONE ACETATE 150 MG/ML IM SUSP: 150.0000 mg | INTRAMUSCULAR | 3 refills | Status: DC

## 2021-02-14 NOTE — Progress Notes (Signed)
Date last pap: 2018 with Dr Cletis Media. Last Depo-Provera: 11/17/20. Side Effects if any: none. Serum HCG indicated? NA. Depo-Provera 150 mg IM given by: Crosby Oyster, RN . Next appointment due Jan 23-Feb 6th.

## 2021-02-14 NOTE — Progress Notes (Signed)
Patient was assessed and managed by nursing staff during this encounter. I have reviewed the chart and agree with the documentation and plan.   Verita Schneiders, MD 02/14/2021 4:49 PM

## 2021-03-30 ENCOUNTER — Encounter: Payer: Self-pay | Admitting: Pulmonary Disease

## 2021-03-30 ENCOUNTER — Other Ambulatory Visit: Payer: Self-pay

## 2021-03-30 ENCOUNTER — Ambulatory Visit: Payer: 59 | Admitting: Pulmonary Disease

## 2021-03-30 VITALS — BP 152/88 | HR 100 | Temp 97.9°F | Ht 68.0 in | Wt 216.4 lb

## 2021-03-30 DIAGNOSIS — G4700 Insomnia, unspecified: Secondary | ICD-10-CM | POA: Diagnosis not present

## 2021-03-30 MED ORDER — LEVALBUTEROL TARTRATE 45 MCG/ACT IN AERO: 2.0000 | INHALATION_SPRAY | Freq: Three times a day (TID) | RESPIRATORY_TRACT | 2 refills | Status: DC | PRN

## 2021-03-30 NOTE — Patient Instructions (Addendum)
We will schedule for a home sleep study, make sure that you have your acrylic nail on the finger use for oxygen testing off on the night of the test.  We have refilled your albuterol in the form of levoalbuterol (xopenex).   We will see you in follow-up in 2 months time.

## 2021-03-30 NOTE — Progress Notes (Signed)
Subjective:    Patient ID: Anita Weaver, female    DOB: 1976/03/06, 45 y.o.   MRN: [id number] Chief Complaint  Patient presents with   Follow-up    No current sx.     HPI Patient is a 45 year old lifelong never smoker who presented previously in May 2021 for evaluation of nocturnal breathlessness and multiple awakenings.  At that time a 2D echo was obtained that was normal.  PFTs were nonrevealing.  The patient was to be scheduled for a sleep study at [facility] neurological which was her preference.  There was apparently an attempt to do the test however because of her acrylic nails a good oxygen saturation could not be recorded and the remainder of the data was suspect.  She could not sleep well in the lab environment.  She continues to have issues with nocturnal awakenings.  We will definitely need a sleep study.  The patient notes that during today she does not have much issues with shortness of breath.  These occur mostly at nighttime.  Has not noted any other issues.  She has not had any cough, sputum production or hemoptysis.  No weight loss or anorexia.  No fevers, chills or sweats.  No chest pain.  No issues with reflux.  She does have issues with headaches particularly in the mornings and episodes of breathlessness as noted at nighttime which awaken her from sleep.  Review of Systems A 10 point review of systems was performed and it is as noted above otherwise negative.  Patient Active Problem List   Diagnosis Date Noted   Headache disorder 03/23/2020   COVID-19 12/30/2019   Arthritis 07/09/2018   Chronic interstitial cystitis 07/09/2018   Irritable bowel syndrome 07/09/2018   Scoliosis deformity of spine 07/09/2018   Asthma 04/27/2017   Acid reflux 04/27/2017   Hiatal hernia with GERD 04/27/2017   Contact dermatitis 05/15/2016   Fatigue 01/20/2016   Mild dysplasia of cervix (CIN I) 09/29/2014   Pap smear abnormality of cervix with LGSIL 09/21/2014   FH: genetic disease  carrier 04/22/2014   Migraine headache 05/08/2013   Bipolar disorder, unspecified ([place]) 05/08/2013   Endometriosis 04/18/2012   Chronic pelvic pain in female 04/18/2012   Female genital symptoms 04/18/2012   Social History   Tobacco Use   Smoking status: Never   Smokeless tobacco: Never  Substance Use Topics   Alcohol use: Yes    Alcohol/week: 0.0 standard drinks    Comment: occasionally   Allergies  Allergen Reactions   Imitrex [Sumatriptan] Other (See Comments)    Heart palpitations, hot/cold sweats   Demerol [Meperidine] Other (See Comments)    hallucinations   Flagyl [Metronidazole] Hives   Current Meds  Medication Sig   albuterol (PROAIR HFA) 108 (90 Base) MCG/ACT inhaler Inhale 2 puffs into the lungs every 4 (four) hours as needed for wheezing or shortness of breath.   lurasidone (LATUDA) 40 MG TABS tablet TAKE 1 TABLET (40 MG TOTAL) BY MOUTH DAILY WITH BREAKFAST.   medroxyPROGESTERone (DEPO-PROVERA) 150 MG/ML injection Inject 1 mL (150 mg total) into the muscle every 3 (three) months.   medroxyPROGESTERone (DEPO-PROVERA) 150 MG/ML injection Inject 1 mL (150 mg total) into the muscle every 3 (three) months.   nortriptyline (PAMELOR) 10 MG capsule Take 40 mg by mouth at bedtime.   ondansetron (ZOFRAN) 4 MG tablet Take one tablet every 8 hours as needed. #10 to last 30 days. For migraines.   pantoprazole (PROTONIX) 40 MG tablet Take 1  tablet (40 mg total) by mouth daily.   rizatriptan (MAXALT) 10 MG tablet Take 1 tablet (10 mg total) by mouth as needed for migraine. May repeat in 2 hours if needed   Immunization History  Administered Date(s) Administered   PFIZER(Purple Top)SARS-COV-2 Vaccination 02/19/2020   Unspecified SARS-COV-2 Vaccination 02/03/2020       Objective:   Physical Exam BP (!) 152/88 (BP Location: Left Arm, Cuff Size: Large)    Pulse 100    Temp 97.9 F (36.6 C) (Temporal)    Ht 5\' 8"  (1.727 m)    Wt 216 lb 6.4 oz (98.2 kg)    SpO2 96%    BMI 32.90  kg/m  GENERAL: Obese woman, no acute distress, fully ambulatory.  No conversational dyspnea HEAD: Normocephalic, atraumatic.  EYES: Pupils equal, round, reactive to light.  No scleral icterus.  MOUTH: Nose/mouth/throat not examined due to masking requirements for COVID 19. NECK: Supple. No thyromegaly. Trachea midline. No JVD.  No adenopathy. PULMONARY: Good air entry bilaterally.  No adventitious sounds. CARDIOVASCULAR: S1 and S2. Regular rate and rhythm.  No rubs, murmurs or gallops heard. ABDOMEN: Obese, otherwise benign. MUSCULOSKELETAL: No joint deformity, no clubbing, no edema.  NEUROLOGIC: No focal deficit, no gait disturbance, speech is fluent. SKIN: Intact,warm,dry. PSYCH: Mood and behavior normal.  Results of the Epworth flowsheet 03/30/2021  Sitting and reading 0  Watching TV 1  Sitting, inactive in a public place (e.g. a theatre or a meeting) 0  As a passenger in a car for an hour without a break 0  Lying down to rest in the afternoon when circumstances permit 3  Sitting and talking to someone 0  Sitting quietly after a lunch without alcohol 0  In a car, while stopped for a few minutes in traffic 0  Total score 4       Assessment & Plan:     ICD-10-CM   1. Frequent nocturnal awakening  G47.00 Home sleep test   Suspect sleep disordered breathing High suspect for sleep apnea Recommend home sleep test     Orders Placed This Encounter  Procedures   Home sleep test    Standing Status:   Future    Number of Occurrences:   1    Standing Expiration Date:   03/30/2022    Order Specific Question:   Where should this test be performed:    Answer:   [facility] - Pulmonary    Patient has difficulties with sleep disordered breathing.  She is a high sleep apnea suspect.  Recommend home sleep study.  The patient was advised to take the acrylic nails off of the O2 monitoring finger the night of the test.  Follow-up in 2 months time she is to contact us prior to that time should any  new difficulties arise.   Renold Don, MD Advanced Bronchoscopy PCCM [facility]    *This note was dictated using voice recognition software/Dragon.  Despite best efforts to proofread, errors can occur which can change the meaning. Any transcriptional errors that result from this process are unintentional and may not be fully corrected at the time of dictation.

## 2021-05-02 ENCOUNTER — Ambulatory Visit (INDEPENDENT_AMBULATORY_CARE_PROVIDER_SITE_OTHER): Payer: 59 | Admitting: *Deleted

## 2021-05-02 ENCOUNTER — Other Ambulatory Visit: Payer: Self-pay

## 2021-05-02 VITALS — BP 134/85 | HR 103

## 2021-05-02 DIAGNOSIS — Z3042 Encounter for surveillance of injectable contraceptive: Secondary | ICD-10-CM | POA: Diagnosis not present

## 2021-05-02 MED ORDER — MEDROXYPROGESTERONE ACETATE 150 MG/ML IM SUSP
150.0000 mg | Freq: Once | INTRAMUSCULAR | Status: AC
  Administered 2021-05-02: 150 mg via INTRAMUSCULAR

## 2021-05-02 NOTE — Progress Notes (Signed)
Date last pap: 04/2021-an her OBGYN office . Last Depo-Provera: 02/14/2021. Side Effects if any: none. Serum HCG indicated? NA. Depo-Provera 150 mg IM given by: Crosby Oyster, RN . Next appointment due April 10-24th.

## 2021-05-05 NOTE — Progress Notes (Signed)
Attestation of Attending Supervision of clinical support staff: I agree with the care provided to this patient and was available for any consultation.  I have reviewed the RN's note and chart. I was available for consult and to see the patient if needed.   Martin Belling MD MPH Attending Physician [facility]  

## 2021-05-13 ENCOUNTER — Other Ambulatory Visit: Payer: Self-pay

## 2021-05-13 ENCOUNTER — Ambulatory Visit: Payer: 59

## 2021-05-13 DIAGNOSIS — G4700 Insomnia, unspecified: Secondary | ICD-10-CM

## 2021-05-13 DIAGNOSIS — G4733 Obstructive sleep apnea (adult) (pediatric): Secondary | ICD-10-CM | POA: Diagnosis not present

## 2021-05-18 ENCOUNTER — Telehealth: Payer: Self-pay | Admitting: Pulmonary Disease

## 2021-05-18 DIAGNOSIS — G4733 Obstructive sleep apnea (adult) (pediatric): Secondary | ICD-10-CM | POA: Diagnosis not present

## 2021-05-18 NOTE — Telephone Encounter (Signed)
Dr Halford Chessman has read pt's home sleep study and it has been scanned to chart.

## 2021-05-18 NOTE — Telephone Encounter (Signed)
Spoke to patient and relayed below results. She agrees with cpap. Order placed.  Recall placed.  Nothing further needed at this time.

## 2021-05-18 NOTE — Telephone Encounter (Signed)
Home sleep study shows that she has mild sleep apnea but significant O2 desaturations.  Given her O2 desaturations it is wise to start CPAP therapy.  I recommend auto CPAP 5 to 20 cm of H2O.  With humidity and mask of choice.

## 2021-05-19 ENCOUNTER — Other Ambulatory Visit: Payer: Self-pay | Admitting: Obstetrics and Gynecology

## 2021-05-19 DIAGNOSIS — R928 Other abnormal and inconclusive findings on diagnostic imaging of breast: Secondary | ICD-10-CM

## 2021-05-24 ENCOUNTER — Ambulatory Visit: Payer: 59 | Admitting: Internal Medicine

## 2021-05-27 ENCOUNTER — Encounter: Payer: Self-pay | Admitting: Internal Medicine

## 2021-05-27 ENCOUNTER — Ambulatory Visit: Payer: 59 | Admitting: Internal Medicine

## 2021-05-27 VITALS — BP 122/80 | HR 110 | Temp 98.2°F | Resp 16 | Wt 213.4 lb

## 2021-05-27 DIAGNOSIS — R070 Pain in throat: Secondary | ICD-10-CM | POA: Diagnosis not present

## 2021-05-27 DIAGNOSIS — G43909 Migraine, unspecified, not intractable, without status migrainosus: Secondary | ICD-10-CM

## 2021-05-27 DIAGNOSIS — I10 Essential (primary) hypertension: Secondary | ICD-10-CM

## 2021-05-27 MED ORDER — AMLODIPINE BESYLATE 5 MG PO TABS: 5.0000 mg | ORAL_TABLET | Freq: Every day | ORAL | 3 refills | Status: DC

## 2021-05-27 MED ORDER — ONDANSETRON HCL 4 MG PO TABS: ORAL_TABLET | ORAL | 2 refills | Status: DC

## 2021-05-27 MED ORDER — FLUTICASONE PROPIONATE 50 MCG/ACT NA SUSP: 2.0000 | Freq: Every day | NASAL | 6 refills | Status: DC

## 2021-05-27 NOTE — Assessment & Plan Note (Signed)
Stable, continue Amlodipine 5 mg. Will check BP at home, follow up in 1 month.

## 2021-05-27 NOTE — Patient Instructions (Signed)
It was great seeing you today!  Plan discussed at today's visit: -Continue Amlodipine 5 mg daily -Please start checking blood pressures -Try Flonase 2 sprays on both sides twice a day  Follow up in: 1 month   Take care and let us know if you have any questions or concerns prior to your next visit.  Dr. Rosana Berger

## 2021-05-27 NOTE — Progress Notes (Signed)
Established Patient Office Visit  Subjective:  Patient ID: Anita Weaver, female    DOB: [birth year]  Age: 46 y.o. MRN: [id number]  CC:  Chief Complaint  Patient presents with   itchy throat    Since nov.   Hypertension    She was taken off of lisinopril due to scrathcy throat and placed on norvasc which has controlled bp.  But still has throat symptoms    HPI Anita Weaver presents for blood pressure.   Hypertension: -Medications: Lisinopril 2.5 switched to Amlodipine 5 mg due to chronic cough - on for about 2 weeks  -Patient is compliant with above medications and reports no side effects. -Checking BP at home (average): not checking since new dose  -Denies any SOB, CP, vision changes, LE edema or symptoms of hypotension  Scratchy throat/throat discomfort since November, no coughing, symptoms occurring on and off but at least once a day, no known triggers, drinking water helps. Hard candy helps. No URI symptoms, no dry mouth. Gags occasionally due to discomfort, no pain. No issues swallowing. Taking PPI.   Past Medical History:  Diagnosis Date   Allergy    Arthritis    Asthma    Chronic pelvic pain in female    Endometriosis    FH: breast cancer in first degree relative    Mother and sister both are BRCA positive; patient is BRCA negative   GERD (gastroesophageal reflux disease)    Headache    migraines   Heart murmur    Hyperlipidemia    Hypertension    Interstitial cystitis    Irritable bowel disease    Neck pain     Past Surgical History:  Procedure Laterality Date   BREAST BIOPSY Left 02/08/2017   CHOLECYSTECTOMY     LAPAROSCOPY  2006   TUBAL LIGATION      Family History  Problem Relation Age of Onset   Cancer Mother 47       breast   Breast cancer Mother 67   Cancer Sister 71   Breast cancer Sister 49   Kidney disease Son    Heart attack Maternal Grandfather    Heart attack Paternal Grandmother    Heart attack Paternal Grandfather     Social  History   Socioeconomic History   Marital status: Married    Spouse name: [name]   Number of children: 3   Years of education: 12   Highest education level: Not on file  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  Tobacco Use   Smoking status: Never   Smokeless tobacco: Never  Vaping Use   Vaping Use: Never used  Substance and Sexual Activity   Alcohol use: Yes    Alcohol/week: 0.0 standard drinks    Comment: occasionally   Drug use: No   Sexual activity: Yes    Partners: Male    Birth control/protection: Surgical    Comment: tubaligation  Other Topics Concern   Not on file  Social History Narrative   ** Merged History Encounter **       ** Data from: 02/09/14 Enc Dept: [facility]       ** Data from: 11/10/13 Enc Dept: [facility]   Patient lives at home with [name]   Patient works full time.   Right handed.   Caffeine- none   Education- high school   Social Determinants of Health   Financial Resource Strain: Not on file  Food Insecurity: Not on  file  Transportation Needs: Not on file  Physical Activity: Not on file  Stress: Not on file  Social Connections: Not on file  Intimate Partner Violence: Not on file    Outpatient Medications Prior to Visit  Medication Sig Dispense Refill   levalbuterol (XOPENEX HFA) 45 MCG/ACT inhaler Inhale 2 puffs into the lungs every 8 (eight) hours as needed for wheezing or shortness of breath. 1 each 2   lisinopril (ZESTRIL) 2.5 MG tablet Take 2.5 mg by mouth daily.     lurasidone (LATUDA) 40 MG TABS tablet TAKE 1 TABLET (40 MG TOTAL) BY MOUTH DAILY WITH BREAKFAST. 90 tablet 3   medroxyPROGESTERone (DEPO-PROVERA) 150 MG/ML injection Inject 1 mL (150 mg total) into the muscle every 3 (three) months. 1 mL 3   medroxyPROGESTERone (DEPO-PROVERA) 150 MG/ML injection Inject 1 mL (150 mg total) into the muscle every 3 (three) months. 1 mL 3   nortriptyline (PAMELOR) 10 MG capsule Take 40 mg by mouth at bedtime.      ondansetron (ZOFRAN) 4 MG tablet Take one tablet every 8 hours as needed. #10 to last 30 days. For migraines. 10 tablet 5   pantoprazole (PROTONIX) 40 MG tablet Take 1 tablet (40 mg total) by mouth daily. 90 tablet 1   rizatriptan (MAXALT) 10 MG tablet Take 1 tablet (10 mg total) by mouth as needed for migraine. May repeat in 2 hours if needed 10 tablet 3   No facility-administered medications prior to visit.    Allergies  Allergen Reactions   Imitrex [Sumatriptan] Other (See Comments)    Heart palpitations, hot/cold sweats   Demerol [Meperidine] Other (See Comments)    hallucinations   Flagyl [Metronidazole] Hives    ROS Review of Systems  Constitutional:  Negative for chills and fever.  HENT:  Negative for congestion, postnasal drip, rhinorrhea, sinus pressure, sinus pain, sore throat, trouble swallowing and voice change.   Eyes:  Negative for visual disturbance.  Respiratory:  Negative for cough and shortness of breath.   Cardiovascular:  Negative for chest pain and leg swelling.  Neurological:  Negative for dizziness and headaches.     Objective:    Physical Exam Constitutional:      Appearance: Normal appearance.  HENT:     Head: Normocephalic and atraumatic.  Eyes:     Conjunctiva/sclera: Conjunctivae normal.  Cardiovascular:     Rate and Rhythm: Normal rate and regular rhythm.  Pulmonary:     Effort: Pulmonary effort is normal.     Breath sounds: Normal breath sounds.  Musculoskeletal:     Right lower leg: No edema.     Left lower leg: No edema.  Skin:    General: Skin is warm and dry.  Neurological:     General: No focal deficit present.     Mental Status: She is alert. Mental status is at baseline.  Psychiatric:        Mood and Affect: Mood normal.        Behavior: Behavior normal.    BP 122/80    Pulse (!) 110    Temp 98.2 F (36.8 C)    Resp 16    Wt 213 lb 6.4 oz (96.8 kg)    SpO2 99%    BMI 32.45 kg/m  Wt Readings from Last 3 Encounters:   03/30/21 216 lb 6.4 oz (98.2 kg)  11/29/20 211 lb (95.7 kg)  07/27/20 214 lb (97.1 kg)     Health Maintenance Due  Topic Date Due  TETANUS/TDAP  Never done   PAP SMEAR-Modifier  12/06/2019   COVID-19 Vaccine (2 - Pfizer series) 03/11/2020   COLONOSCOPY (Pts 45-47yr Insurance coverage will need to be confirmed)  Never done    There are no preventive care reminders to display for this patient.  Lab Results  Component Value Date   TSH 2.00 05/14/2020   Lab Results  Component Value Date   WBC 8.1 05/14/2020   HGB 15.0 05/14/2020   HCT 42.9 05/14/2020   MCV 88.1 05/14/2020   PLT 329 05/14/2020   Lab Results  Component Value Date   NA 140 05/14/2020   K 4.0 05/14/2020   CO2 27 05/14/2020   GLUCOSE 82 05/14/2020   BUN 10 05/14/2020   CREATININE 0.89 05/14/2020   BILITOT 0.6 05/14/2020   ALKPHOS 41 (L) 03/03/2013   AST 13 05/14/2020   ALT 16 05/14/2020   PROT 6.8 05/14/2020   ALBUMIN 3.8 03/03/2013   CALCIUM 9.6 05/14/2020   ANIONGAP 6 (L) 03/03/2013   Lab Results  Component Value Date   CHOL 179 05/14/2020   Lab Results  Component Value Date   HDL 34 (L) 05/14/2020   Lab Results  Component Value Date   LDLCALC 117 (H) 05/14/2020   Lab Results  Component Value Date   TRIG 168 (H) 05/14/2020   Lab Results  Component Value Date   CHOLHDL 5.3 (H) 05/14/2020   Lab Results  Component Value Date   HGBA1C 4.6 01/23/2019      Assessment & Plan:   1. Hypertension, unspecified type: Stable, continue Amlodipine 5 mg. Will check BP at home, follow up in 1 month.  - amLODipine (NORVASC) 5 MG tablet; Take 1 tablet (5 mg total) by mouth daily.  Dispense: 90 tablet; Refill: 3  2. Throat discomfort: Consistent with post-nasal drip, treat with Flonase and if symptoms still recurrent will referral to ENT.  - fluticasone (FLONASE) 50 MCG/ACT nasal spray; Place 2 sprays into both nostrils daily.  Dispense: 16 g; Refill: 6  3. Migraine without status  migrainosus, not intractable, unspecified migraine type: Stable, following with Neurology, refilled Zofran.   - ondansetron (ZOFRAN) 4 MG tablet; Take one tablet every 8 hours as needed. #10 to last 30 days. For migraines.  Dispense: 20 tablet; Refill: 2   Follow-up: Return in about 4 weeks (around 06/24/2021).    ETeodora Medici DO

## 2021-05-31 ENCOUNTER — Ambulatory Visit: Payer: 59 | Admitting: Pulmonary Disease

## 2021-06-14 ENCOUNTER — Ambulatory Visit: Payer: 59

## 2021-06-14 ENCOUNTER — Ambulatory Visit
Admission: RE | Admit: 2021-06-14 | Discharge: 2021-06-14 | Disposition: A | Discharge status: Home / Self Care | Payer: 59 | Source: Ambulatory Visit | Attending: Obstetrics and Gynecology | Admitting: Obstetrics and Gynecology

## 2021-06-14 DIAGNOSIS — R928 Other abnormal and inconclusive findings on diagnostic imaging of breast: Secondary | ICD-10-CM

## 2021-06-24 ENCOUNTER — Ambulatory Visit: Payer: 59 | Admitting: Internal Medicine

## 2021-06-24 ENCOUNTER — Encounter: Payer: Self-pay | Admitting: Internal Medicine

## 2021-06-24 VITALS — BP 126/84 | HR 114 | Temp 98.0°F | Resp 16 | Ht 68.0 in | Wt 214.5 lb

## 2021-06-24 DIAGNOSIS — Z13220 Encounter for screening for lipoid disorders: Secondary | ICD-10-CM

## 2021-06-24 DIAGNOSIS — I10 Essential (primary) hypertension: Secondary | ICD-10-CM | POA: Diagnosis not present

## 2021-06-24 DIAGNOSIS — R070 Pain in throat: Secondary | ICD-10-CM | POA: Diagnosis not present

## 2021-06-24 DIAGNOSIS — J302 Other seasonal allergic rhinitis: Secondary | ICD-10-CM

## 2021-06-24 MED ORDER — CETIRIZINE HCL 10 MG PO CHEW: 10.0000 mg | CHEWABLE_TABLET | Freq: Every day | ORAL | 3 refills | Status: DC

## 2021-06-24 MED ORDER — FLUTICASONE PROPIONATE 50 MCG/ACT NA SUSP: 2.0000 | Freq: Every day | NASAL | 6 refills | Status: DC

## 2021-06-24 NOTE — Assessment & Plan Note (Signed)
Stable, continue current medications.  

## 2021-06-24 NOTE — Progress Notes (Signed)
? ?Established Patient Office Visit ? ?Subjective:  ?Patient ID: Anita Weaver, female    DOB: 1976-01-20  Age: 46 y.o. MRN: [id number] ? ?CC:  ?Chief Complaint  ?Patient presents with  ? Follow-up  ? Hypertension  ? itchy throat  ? ? ?HPI ?Anita Weaver presents for blood pressure.  ? ?Hypertension: ?-Medications: Amlodipine 5 mg (switched from Lisinopril due to cough) ?-Patient is compliant with above medications and reports no side effects. ?-Checking BP at home (average): not checking  ?-Denies any SOB, CP, vision changes, LE edema or symptoms of hypotension ? ?Chronic cough: ?-Had been on ACEI but this was discontinued due to cough ?-Treated for GERD with Protonix 40 ?-Started treating for PND at [facility] with Flonase, doing well, symptoms almost resolved. Still occasional scratchy throat, cough very rare ?-Does have some seasonal allergies, not on any medications ? ?Health Maintenance: ?-Blood work due ?-Pap in 1/23 ?-Mammogram 3/23 Birads-2 ? ?Past Medical History:  ?Diagnosis Date  ? Allergy   ? Arthritis   ? Asthma   ? Chronic pelvic pain in female   ? Endometriosis   ? FH: breast cancer in first degree relative   ? Mother and sister both are BRCA positive; patient is BRCA negative  ? GERD (gastroesophageal reflux disease)   ? Headache   ? migraines  ? Heart murmur   ? Hyperlipidemia   ? Hypertension   ? Interstitial cystitis   ? Irritable bowel disease   ? Neck pain   ? ? ?Past Surgical History:  ?Procedure Laterality Date  ? BREAST BIOPSY Left 02/08/2017  ? CHOLECYSTECTOMY    ? LAPAROSCOPY  2006  ? TUBAL LIGATION    ? ? ?Family History  ?Problem Relation Age of Onset  ? Cancer Mother 107  ?     breast  ? Breast cancer Mother 57  ? Cancer Sister 31  ? Breast cancer Sister 22  ? Kidney disease Son   ? Heart attack Maternal Grandfather   ? Heart attack Paternal Grandmother   ? Heart attack Paternal Grandfather   ? ? ?Social History  ? ?Socioeconomic History  ? Marital status: Married  ?  Spouse name: Anita Weaver  ? Number  of children: 3  ? Years of education: 59  ? Highest education level: Not on file  ?Occupational History  ?  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 Smoking status: Never  ? Smokeless tobacco: Never  ?Vaping Use  ? Vaping Use: Never used  ?Substance and Sexual Activity  ? Alcohol use: Yes  ?  Alcohol/week: 0.0 standard drinks  ?  Comment: occasionally  ? Drug use: No  ? Sexual activity: Yes  ?  Partners: Male  ?  Birth control/protection: Surgical  ?  Comment: tubaligation  ?Other Topics Concern  ? Not on file  ?Social History Narrative  ? ** Merged History Encounter **  ?    ? ** Data from: 02/09/14 Enc Dept: [facility]  ?    ? ** Data from: 11/10/13 Enc Dept: [facility]  ? Patient lives at home with her husband Anita Weaver)  ? Patient works full time.  ? Right handed.  ? Caffeine- none  ? Education- high school  ? ?Social Determinants of Health  ? ?Financial Resource Strain: Not on file  ?Food Insecurity: Not on file  ?Transportation Needs: Not on file  ?Physical Activity: Not on file  ?Stress: Not on file  ?Social Connections: Not on file  ?Intimate Partner  Violence: Not on file  ? ? ?Outpatient Medications Prior to Visit  ?Medication Sig Dispense Refill  ? amLODipine (NORVASC) 5 MG tablet Take 1 tablet (5 mg total) by mouth daily. 90 tablet 3  ? fluticasone (FLONASE) 50 MCG/ACT nasal spray Place 2 sprays into both nostrils daily. 16 g 6  ? levalbuterol (XOPENEX HFA) 45 MCG/ACT inhaler Inhale 2 puffs into the lungs every 8 (eight) hours as needed for wheezing or shortness of breath. 1 each 2  ? lurasidone (LATUDA) 40 MG TABS tablet TAKE 1 TABLET (40 MG TOTAL) BY MOUTH DAILY WITH BREAKFAST. 90 tablet 3  ? medroxyPROGESTERone (DEPO-PROVERA) 150 MG/ML injection Inject 1 mL (150 mg total) into the muscle every 3 (three) months. 1 mL 3  ? medroxyPROGESTERone (DEPO-PROVERA) 150 MG/ML injection Inject 1 mL (150 mg total) into the muscle every 3 (three) months. 1 mL 3  ? nortriptyline (PAMELOR) 10 MG capsule Take 40  mg by mouth at bedtime.    ? ondansetron (ZOFRAN) 4 MG tablet Take one tablet every 8 hours as needed. #10 to last 30 days. For migraines. 20 tablet 2  ? pantoprazole (PROTONIX) 40 MG tablet Take 1 tablet (40 mg total) by mouth daily. 90 tablet 1  ? rizatriptan (MAXALT) 10 MG tablet Take 1 tablet (10 mg total) by mouth as needed for migraine. May repeat in 2 hours if needed 10 tablet 3  ? ?No facility-administered medications prior to visit.  ? ? ?Allergies  ?Allergen Reactions  ? Imitrex [Sumatriptan] Other (See Comments)  ?  Heart palpitations, hot/cold sweats  ? Demerol [Meperidine] Other (See Comments)  ?  hallucinations  ? Flagyl [Metronidazole] Hives  ? ? ?ROS ?Review of Systems  ?Constitutional:  Negative for chills and fever.  ?HENT:  Negative for congestion, postnasal drip, rhinorrhea, sinus pressure, sinus pain, sore throat, trouble swallowing and voice change.   ?Eyes:  Negative for visual disturbance.  ?Respiratory:  Negative for cough and shortness of breath.   ?Cardiovascular:  Negative for chest pain and leg swelling.  ?Neurological:  Negative for dizziness and headaches.  ? ?  ?Objective:  ?  ?Physical Exam ?Constitutional:   ?   Appearance: Normal appearance.  ?HENT:  ?   Head: Normocephalic and atraumatic.  ?   Mouth/Throat:  ?   Mouth: Mucous membranes are moist.  ?   Pharynx: Oropharynx is clear.  ?Eyes:  ?   Conjunctiva/sclera: Conjunctivae normal.  ?Cardiovascular:  ?   Rate and Rhythm: Normal rate and regular rhythm.  ?Pulmonary:  ?   Effort: Pulmonary effort is normal.  ?   Breath sounds: Normal breath sounds.  ?Musculoskeletal:  ?   Right lower leg: No edema.  ?   Left lower leg: No edema.  ?Skin: ?   General: Skin is warm and dry.  ?Neurological:  ?   General: No focal deficit present.  ?   Mental Status: She is alert. Mental status is at baseline.  ?Psychiatric:     ?   Mood and Affect: Mood normal.     ?   Behavior: Behavior normal.  ? ? ?BP 126/84   Pulse (!) 114   Temp 98 ?F (36.7 ?C)    Resp 16   Ht 5' 8"  (1.727 m)   Wt 214 lb 8 oz (97.3 kg)   SpO2 98%   BMI 32.61 kg/m?  ?Wt Readings from Last 3 Encounters:  ?05/27/21 213 lb 6.4 oz (96.8 kg)  ?03/30/21 216 lb 6.4 oz (  98.2 kg)  ?11/29/20 211 lb (95.7 kg)  ? ? ? ?Health Maintenance Due  ?Topic Date Due  ? PAP SMEAR-Modifier  12/06/2019  ? COVID-19 Vaccine (2 - Pfizer series) 03/11/2020  ? COLONOSCOPY (Pts 45-12yr Insurance coverage will need to be confirmed)  Never done  ? ? ?There are no preventive care reminders to display for this patient. ? ?Lab Results  ?Component Value Date  ? TSH 2.00 05/14/2020  ? ?Lab Results  ?Component Value Date  ? WBC 8.1 05/14/2020  ? HGB 15.0 05/14/2020  ? HCT 42.9 05/14/2020  ? MCV 88.1 05/14/2020  ? PLT 329 05/14/2020  ? ?Lab Results  ?Component Value Date  ? NA 140 05/14/2020  ? K 4.0 05/14/2020  ? CO2 27 05/14/2020  ? GLUCOSE 82 05/14/2020  ? BUN 10 05/14/2020  ? CREATININE 0.89 05/14/2020  ? BILITOT 0.6 05/14/2020  ? ALKPHOS 41 (L) 03/03/2013  ? AST 13 05/14/2020  ? ALT 16 05/14/2020  ? PROT 6.8 05/14/2020  ? ALBUMIN 3.8 03/03/2013  ? CALCIUM 9.6 05/14/2020  ? ANIONGAP 6 (L) 03/03/2013  ? ?Lab Results  ?Component Value Date  ? CHOL 179 05/14/2020  ? ?Lab Results  ?Component Value Date  ? HDL 34 (L) 05/14/2020  ? ?Lab Results  ?Component Value Date  ? LDLCALC 117 (H) 05/14/2020  ? ?Lab Results  ?Component Value Date  ? TRIG 168 (H) 05/14/2020  ? ?Lab Results  ?Component Value Date  ? CHOLHDL 5.3 (H) 05/14/2020  ? ?Lab Results  ?Component Value Date  ? HGBA1C 4.6 01/23/2019  ? ? ?  ?Assessment & Plan:  ? ?Problem List Items Addressed This Visit   ? ?  ? Cardiovascular and Mediastinum  ? Hypertension - Primary  ?  Stable, continue current medications. ? ?  ?  ? Relevant Orders  ? CBC w/Diff/Platelet  ? COMPLETE METABOLIC PANEL WITH GFR  ? ?Other Visit Diagnoses   ? ? Throat discomfort    - due to PND, continue Flonase, treat allergies with oral anti-histamine.  ? Relevant Medications  ? cetirizine (ZYRTEC) 10  MG chewable tablet  ? fluticasone (FLONASE) 50 MCG/ACT nasal spray  ? Seasonal allergies      ? Relevant Medications  ? cetirizine (ZYRTEC) 10 MG chewable tablet  ? fluticasone (FLONASE) 50 MCG/ACT nasal s

## 2021-06-24 NOTE — Patient Instructions (Addendum)
It was great seeing you today! ? ?Plan discussed at today's visit: ?-Refills of Flonase and Zyrtec (take before bed, can make sleepy) ?-Continue Amlodipine for blood pressure  ?-Bloodwork today as well  ? ?Follow up in: 6 months  ? ?Take care and let us know if you have any questions or concerns prior to your next visit. ? ?Dr. Rosana Berger ? ?

## 2021-06-25 LAB — CBC WITH DIFFERENTIAL/PLATELET
Absolute Monocytes: 582 cells/uL (ref 200–950)
Basophils Absolute: 36 cells/uL (ref 0–200)
Basophils Relative: 0.4 %
Eosinophils Absolute: 382 cells/uL (ref 15–500)
Eosinophils Relative: 4.2 %
HCT: 42.8 % (ref 35.0–45.0)
Hemoglobin: 14.8 g/dL (ref 11.7–15.5)
Lymphs Abs: 3458 cells/uL (ref 850–3900)
MCH: 30.4 pg (ref 27.0–33.0)
MCHC: 34.6 g/dL (ref 32.0–36.0)
MCV: 87.9 fL (ref 80.0–100.0)
MPV: 8.9 fL (ref 7.5–12.5)
Monocytes Relative: 6.4 %
Neutro Abs: 4641 cells/uL (ref 1500–7800)
Neutrophils Relative %: 51 %
Platelets: 342 10*3/uL (ref 140–400)
RBC: 4.87 10*6/uL (ref 3.80–5.10)
RDW: 12.3 % (ref 11.0–15.0)
Total Lymphocyte: 38 %
WBC: 9.1 10*3/uL (ref 3.8–10.8)

## 2021-06-25 LAB — LIPID PANEL
Cholesterol: 193 mg/dL (ref <200)
HDL: 39 mg/dL — ABNORMAL LOW (ref >=50)
LDL Cholesterol (Calc): 129 mg/dL (calc) — ABNORMAL HIGH
Non-HDL Cholesterol (Calc): 154 mg/dL (calc) — ABNORMAL HIGH (ref <130)
Total CHOL/HDL Ratio: 4.9 (calc) (ref <5.0)
Triglycerides: 141 mg/dL (ref <150)

## 2021-06-25 LAB — COMPLETE METABOLIC PANEL WITH GFR
AG Ratio: 1.7 (calc) (ref 1.0–2.5)
ALT: 21 U/L (ref 6–29)
AST: 15 U/L (ref 10–35)
Albumin: 4.4 g/dL (ref 3.6–5.1)
Alkaline phosphatase (APISO): 50 U/L (ref 31–125)
BUN: 9 mg/dL (ref 7–25)
CO2: 25 mmol/L (ref 20–32)
Calcium: 9.4 mg/dL (ref 8.6–10.2)
Chloride: 104 mmol/L (ref 98–110)
Creat: 0.99 mg/dL (ref 0.50–0.99)
Globulin: 2.6 g/dL (calc) (ref 1.9–3.7)
Glucose, Bld: 102 mg/dL — ABNORMAL HIGH (ref 65–99)
Potassium: 3.6 mmol/L (ref 3.5–5.3)
Sodium: 139 mmol/L (ref 135–146)
Total Bilirubin: 0.5 mg/dL (ref 0.2–1.2)
Total Protein: 7 g/dL (ref 6.1–8.1)
eGFR: 71 mL/min/{1.73_m2} (ref >=60)

## 2021-06-27 ENCOUNTER — Other Ambulatory Visit: Payer: Self-pay

## 2021-06-27 ENCOUNTER — Ambulatory Visit: Payer: 59 | Admitting: Adult Health

## 2021-06-27 ENCOUNTER — Encounter: Payer: Self-pay | Admitting: Adult Health

## 2021-06-27 DIAGNOSIS — G4700 Insomnia, unspecified: Secondary | ICD-10-CM

## 2021-06-27 DIAGNOSIS — F3130 Bipolar disorder, current episode depressed, mild or moderate severity, unspecified: Secondary | ICD-10-CM | POA: Diagnosis not present

## 2021-06-27 NOTE — Progress Notes (Signed)
Anita Weaver ?[id number] ?02-10-1976 ?46 y.o. ? ?Subjective:  ? ?Patient ID:  Anita Weaver is a 46 y.o. (DOB 24-Apr-1975) female. ? ?Chief Complaint: No chief complaint on file. ? ? ?HPI ?Anita Weaver presents to the office today for follow-up of BPD 1 and insomnia. ? ?Accompanied by husband ? ?Describes mood today as "ok". Pleasant. Denies tearfulness. Mood symptoms - denies depression, anxiety, and irritability. Mood is consistent. Stating "I'm doing alright". Feels like the Latuda at 87m daily continues to work well for her. She and husband doing well. Stable interest and motivation. Taking medications as prescribed.  ?Energy levels lower - feels tired - sleep apnea. Active, does not have a regular exercise routine.  ?Enjoys some usual interests and activities. Married. Lives with husband of 8 years. Has 3 grown sons - 3 grandchildren - sons. [place] ?Appetite adequate. Weight gain - 214 pounds - _0 . ?Sleeps well most nights. Averages 8 to 9 hours. ?Focus and concentration stable. Completing tasks. Managing aspects of household. Works full time as a pAir traffic controller  ?Denies SI or HI.  ?Denies AH or VH. ? ? ?PHQ2-9   ? ?[facility] ?PHQ-2 Total Score 0 0 0 0 0  ?PHQ-9 Total Score 0 0 0 -- --  ? ?  ?  ? ?Review of Systems:  ?Review of Systems  ?Musculoskeletal:  Negative for gait problem.  ?Neurological:  Negative for tremors.  ?Psychiatric/Behavioral:    ?     Please refer to HPI  ? ?Medications: I have reviewed the patient's current medications. ? ?Current Outpatient Medications  ?Medication Sig Dispense Refill  ? amLODipine (NORVASC) 5 MG tablet Take 1 tablet (5 mg total) by mouth  daily. 90 tablet 3  ? cetirizine (ZYRTEC) 10 MG chewable tablet Chew 1 tablet (10 mg total) by mouth daily. 30 tablet 3  ? fluticasone (FLONASE) 50 MCG/ACT nasal spray Place 2 sprays into both nostrils daily. 16 g 6  ? levalbuterol (XOPENEX HFA) 45 MCG/ACT inhaler Inhale 2 puffs into the lungs every 8 (eight) hours as needed for wheezing or shortness of breath. 1 each 2  ? lurasidone (LATUDA) 40 MG TABS tablet TAKE 1 TABLET (40 MG TOTAL) BY MOUTH DAILY WITH BREAKFAST. 90 tablet 3  ? medroxyPROGESTERone (DEPO-PROVERA) 150 MG/ML injection Inject 1 mL (150 mg total) into the muscle every 3 (three) months. 1 mL 3  ? nortriptyline (PAMELOR) 10 MG capsule Take 40 mg by mouth at bedtime.    ? ondansetron (ZOFRAN) 4 MG tablet Take one tablet every 8 hours as needed. #10 to last 30 days. For migraines. 20 tablet 2  ? pantoprazole (PROTONIX) 40 MG tablet Take 1 tablet (40 mg total) by mouth daily. 90 tablet 1  ? rizatriptan (MAXALT) 10 MG tablet Take 1 tablet (10 mg total) by mouth as needed for migraine. May repeat in 2 hours if needed 10 tablet 3  ? ?No current facility-administered medications for this visit.  ? ? ?Medication Side Effects: None ? ?Allergies:  ?Allergies  ?Allergen Reactions  ? Imitrex [Sumatriptan] Other (See Comments)  ?  Heart palpitations, hot/cold sweats  ? Demerol [Meperidine] Other (See Comments)  ?  hallucinations  ? Flagyl [Metronidazole] Hives  ? ? ?  Past Medical History:  ?Diagnosis Date  ? Allergy   ? Arthritis   ? Asthma   ? Chronic pelvic pain in female   ? Endometriosis   ? FH: breast cancer in first degree relative   ? Mother and sister both are BRCA positive; patient is BRCA negative  ? GERD (gastroesophageal reflux disease)   ? Headache   ? migraines  ? Heart murmur   ? Hyperlipidemia   ? Hypertension   ? Interstitial cystitis   ? Irritable bowel disease   ? Neck pain   ? ? ?Past Medical History, Surgical history, Social history, and Family history were reviewed and updated as appropriate.   ? ?Please see review of systems for further details on the patient's review from today.  ? ?Objective:  ? ?Physical Exam:  ?There were no vitals taken for this visit. ? ?Physical Exam ?Constitutional:   ?   General: She is not in acute distress. ?Musculoskeletal:     ?   General: No deformity.  ?Neurological:  ?   Mental Status: She is alert and oriented to person, place, and time.  ?   Coordination: Coordination normal.  ?Psychiatric:     ?   Attention and Perception: Attention and perception normal. She does not perceive auditory or visual hallucinations.     ?   Mood and Affect: Mood normal. Mood is not anxious or depressed. Affect is not labile, blunt, angry or inappropriate.     ?   Speech: Speech normal.     ?   Behavior: Behavior normal.     ?   Thought Content: Thought content normal. Thought content is not paranoid or delusional. Thought content does not include homicidal or suicidal ideation. Thought content does not include homicidal or suicidal plan.     ?   Cognition and Memory: Cognition and memory normal.     ?   Judgment: Judgment normal.  ?   Comments: Insight intact  ? ? ?Lab Review:  ?   ?Component Value Date/Time  ? NA 139 06/24/2021 1519  ? NA 139 03/03/2013 1545  ? K 3.6 06/24/2021 1519  ? K 3.7 03/03/2013 1545  ? CL 104 06/24/2021 1519  ? CL 105 03/03/2013 1545  ? CO2 25 06/24/2021 1519  ? CO2 28 03/03/2013 1545  ? GLUCOSE 102 (H) 06/24/2021 1519  ? GLUCOSE 106 (H) 03/03/2013 1545  ? BUN 9 06/24/2021 1519  ? BUN 11 03/03/2013 1545  ? CREATININE 0.99 06/24/2021 1519  ? CALCIUM 9.4 06/24/2021 1519  ? CALCIUM 8.8 03/03/2013 1545  ? PROT 7.0 06/24/2021 1519  ? PROT 7.3 03/03/2013 1545  ? ALBUMIN 3.8 03/03/2013 1545  ? AST 15 06/24/2021 1519  ? AST 8 (L) 03/03/2013 1545  ? ALT 21 06/24/2021 1519  ? ALT 15 03/03/2013 1545  ? ALKPHOS 41 (L) 03/03/2013 1545  ? BILITOT 0.5 06/24/2021 1519  ? BILITOT 0.4 03/03/2013 1545  ? GFRNONAA 79 05/14/2020 1532  ? GFRAA 91 05/14/2020 1532  ? ? ?   ?Component  Value Date/Time  ? WBC 9.1 06/24/2021 1519  ? RBC 4.87 06/24/2021 1519  ? HGB 14.8 06/24/2021 1519  ? HGB 13.8 03/03/2013 1545  ? HCT 42.8 06/24/2021 1519  ? HCT 40.2 03/03/2013 1545  ? PLT 342 06/24/2021 1519  ? PLT 288 03/03/2013 1545  ? MCV 87.9 06/24/2021 1519  ? MCV 85 03/03/2013 1545  ? MCH 30.4 06/24/2021 1519  ? MCHC 34.6 06/24/2021 1519  ?  RDW 12.3 06/24/2021 1519  ? RDW 13.3 03/03/2013 1545  ? LYMPHSABS 3,458 06/24/2021 1519  ? LYMPHSABS 2.9 03/03/2013 1545  ? MONOABS 752 01/20/2016 1603  ? MONOABS 0.6 03/03/2013 1545  ? EOSABS 382 06/24/2021 1519  ? EOSABS 0.2 03/03/2013 1545  ? BASOSABS 36 06/24/2021 1519  ? BASOSABS 0.0 03/03/2013 1545  ? ? ?No results found for: POCLITH, LITHIUM  ? ?No results found for: PHENYTOIN, PHENOBARB, VALPROATE, CBMZ  ? ?.res ?Assessment: Plan:   ? ?Plan: ? ?1. Latuda 79m daily - 90 days sent with copay card given. ? ?RTC 6 months ? ?Patient advised to contact office with any questions, adverse effects, or acute worsening in signs and symptoms. ? ? ?Time spent with patient was 15 minutes. Greater than 50% of face to face time with patient was spent on counseling and coordination of care.  ? ? ?Discussed potential metabolic side effects associated with atypical antipsychotics, as well as potential risk for movement side effects. Advised pt to contact office if movement side effects occur.  ? ?Diagnoses and all orders for this visit: ? ?Bipolar I disorder, most recent episode depressed ([place] ? ?Insomnia, unspecified type ? ?  ? ?Please see After Visit Summary for patient specific instructions. ? ?Future Appointments  ?Date Time Provider [place] ?07/18/2021  3:50 PM [facility] NURSE [facility]  ? ? ?No orders of the defined types were placed in this encounter. ? ? ?------------------------------- ?

## 2021-07-18 ENCOUNTER — Ambulatory Visit (INDEPENDENT_AMBULATORY_CARE_PROVIDER_SITE_OTHER): Payer: 59

## 2021-07-18 VITALS — BP 145/99 | HR 97 | Wt 210.0 lb

## 2021-07-18 DIAGNOSIS — Z3042 Encounter for surveillance of injectable contraceptive: Secondary | ICD-10-CM | POA: Diagnosis not present

## 2021-07-18 MED ORDER — MEDROXYPROGESTERONE ACETATE 150 MG/ML IM SUSP
150.0000 mg | Freq: Once | INTRAMUSCULAR | Status: AC
  Administered 2021-07-18: 150 mg via INTRAMUSCULAR

## 2021-07-18 NOTE — Progress Notes (Signed)
Date last pap: 10/25/2015. ?Last Depo-Provera: 05/03/2019. ?Side Effects if any: none . ?Serum HCG indicated? N/A. ?Depo-Provera 150 mg IM given by: Keenan Bachelor CMA  . ?Next appointment due 10/03/21-10/17/2021.  ?

## 2021-07-28 ENCOUNTER — Other Ambulatory Visit: Payer: Self-pay | Admitting: Gastroenterology

## 2021-07-28 DIAGNOSIS — R0600 Dyspnea, unspecified: Secondary | ICD-10-CM

## 2021-08-04 ENCOUNTER — Other Ambulatory Visit: Payer: Self-pay | Admitting: Gastroenterology

## 2021-08-04 DIAGNOSIS — R0600 Dyspnea, unspecified: Secondary | ICD-10-CM

## 2021-08-04 MED ORDER — PANTOPRAZOLE SODIUM 40 MG PO TBEC: 40.0000 mg | DELAYED_RELEASE_TABLET | Freq: Every day | ORAL | 0 refills | Status: DC

## 2021-08-08 ENCOUNTER — Telehealth: Payer: Self-pay | Admitting: Internal Medicine

## 2021-08-08 DIAGNOSIS — R350 Frequency of micturition: Secondary | ICD-10-CM

## 2021-08-08 NOTE — Telephone Encounter (Signed)
Referral Request - Has patient seen PCP for this complaint? Yes.   ?*If NO, is insurance requiring patient see PCP for this issue before PCP can refer them? ?Referral for which specialty: urology ?Preferred provider/office: [facility] ?Reason for referral: pt states she cannot empty her bladder.  She had referral last year and did not go.  But she would like the dr to send in new referral for the same thing.  ?

## 2021-08-15 ENCOUNTER — Encounter: Payer: Self-pay | Admitting: *Deleted

## 2021-08-22 ENCOUNTER — Other Ambulatory Visit: Payer: Self-pay | Admitting: Gastroenterology

## 2021-08-22 DIAGNOSIS — R0600 Dyspnea, unspecified: Secondary | ICD-10-CM

## 2021-09-09 ENCOUNTER — Encounter: Payer: Self-pay | Admitting: Internal Medicine

## 2021-09-09 DIAGNOSIS — I10 Essential (primary) hypertension: Secondary | ICD-10-CM

## 2021-09-09 MED ORDER — AMLODIPINE BESYLATE 10 MG PO TABS: 10.0000 mg | ORAL_TABLET | Freq: Every day | ORAL | 0 refills | Status: DC

## 2021-09-09 NOTE — Telephone Encounter (Signed)
Referral placed. Patient is aware and voiced her understanding.  Nothing further needed.

## 2021-09-09 NOTE — Telephone Encounter (Signed)
Received sleep study request from [facility] for oral appliance.  Spoke to patient. She stated that she would like to try oral device vs cpap.  Dr. Patsey Berthold, please advise if okay to place referral. Thanks

## 2021-09-09 NOTE — Addendum Note (Signed)
Addended by: Claudette Head A on: 09/09/2021 11:26 AM   Modules accepted: Orders

## 2021-09-09 NOTE — Telephone Encounter (Signed)
Yes okay to place referral.

## 2021-09-16 ENCOUNTER — Ambulatory Visit: Payer: Self-pay

## 2021-09-16 NOTE — Telephone Encounter (Addendum)
     Chief Complaint: Pt. Has had chest pain on and off this week. Lasts 15 minutes. No radiation. No pain now. Symptoms: No SOB,No  Nausea Frequency: This week Pertinent Negatives: Patient denies any pain now Disposition: '[]'$ ED /'[]'$ Urgent Care (no appt availability in office) / '[x]'$ Appointment(In office/virtual)/ '[]'$  [facility]/ '[]'$ Home Care/ '[]'$ Refused Recommended Disposition /'[]'$ [facility]/ '[x]'$  Follow-up with PCP Additional Notes: Concerned about BP. Amlodipine recently increased. BP 126/95. Appointment made to discuss BP. Instructed to go to ED if chest pain returns.  Reason for Disposition  Patient sounds very sick or weak to the triager  Answer Assessment - Initial Assessment Questions 1. LOCATION: "Where does it hurt?"       Left side 2. RADIATION: "Does the pain go anywhere else?" (e.g., into neck, jaw, arms, back)     No 3. ONSET: "When did the chest pain begin?" (Minutes, hours or days)      This week 4. PATTERN "Does the pain come and go, or has it been constant since it started?"  "Does it get worse with exertion?"      Comes and goes 5. DURATION: "How long does it last" (e.g., seconds, minutes, hours)     15 minutes 6. SEVERITY: "How bad is the pain?"  (e.g., Scale 1-10; mild, moderate, or severe)    - MILD (1-3): doesn't interfere with normal activities     - MODERATE (4-7): interferes with normal activities or awakens from sleep    - SEVERE (8-10): excruciating pain, unable to do any normal activities       8-9 7. CARDIAC RISK FACTORS: "Do you have any history of heart problems or risk factors for heart disease?" (e.g., angina, prior heart attack; diabetes, high blood pressure, high cholesterol, smoker, or strong family history of heart disease)     HTN 8. PULMONARY RISK FACTORS: "Do you have any history of lung disease?"  (e.g., blood clots in lung, asthma, emphysema, birth control pills)     nO 9. CAUSE: "What do you think is causing the chest  pain?"     Unsure 10. OTHER SYMPTOMS: "Do you have any other symptoms?" (e.g., dizziness, nausea, vomiting, sweating, fever, difficulty breathing, cough)       No 11. PREGNANCY: "Is there any chance you are pregnant?" "When was your last menstrual period?"       No  Protocols used: Chest Pain-A-AH

## 2021-09-19 ENCOUNTER — Encounter: Payer: Self-pay | Admitting: Internal Medicine

## 2021-09-19 ENCOUNTER — Ambulatory Visit: Payer: 59 | Admitting: Internal Medicine

## 2021-09-19 VITALS — BP 121/81 | HR 114 | Temp 97.5°F | Resp 16 | Ht 68.0 in | Wt 212.9 lb

## 2021-09-19 DIAGNOSIS — E785 Hyperlipidemia, unspecified: Secondary | ICD-10-CM | POA: Diagnosis not present

## 2021-09-19 DIAGNOSIS — R079 Chest pain, unspecified: Secondary | ICD-10-CM | POA: Diagnosis not present

## 2021-09-19 DIAGNOSIS — I10 Essential (primary) hypertension: Secondary | ICD-10-CM | POA: Diagnosis not present

## 2021-09-19 MED ORDER — AMLODIPINE BESYLATE 10 MG PO TABS: 10.0000 mg | ORAL_TABLET | Freq: Every day | ORAL | 0 refills | Status: DC

## 2021-09-19 NOTE — Progress Notes (Signed)
Established Patient Office Visit  Subjective:  Patient ID: Anita Weaver, female    DOB: 11-28-1975  Age: 46 y.o. MRN: [id number]  CC:  Chief Complaint  Patient presents with   Follow-up   Hypertension    HPI Anita Weaver presents for blood pressure.   Hypertension: -Medications: Amlodipine 10 mg (increased 2 weeks ago from 5 due to high BP's at home) -Failed Meds: thought Lisinopril due to cough but ended up being PND -Patient is compliant with above medications and reports no side effects. -Checking BP at home (average): highest 160/90 - average 140/90 ish over the last few weeks. Last 2 days 120/80-90 -Denies any SOB, vision changes, LE edema or symptoms of hypotension. Does have chest pain and headache -Diagnosed with OSA 2/23 - seeing dentist 6/27 for further treatment plans, cannot tolerate CPAP  HLD: -Medications: Nothing currently  -Last lipid panel: Lipid Panel     Component Value Date/Time   CHOL 193 06/24/2021 1519   TRIG 141 06/24/2021 1519   HDL 39 (L) 06/24/2021 1519   CHOLHDL 4.9 06/24/2021 1519   VLDL 21 05/24/2015 0839   LDLCALC 129 (H) 06/24/2021 1519   The 10-year ASCVD risk score (Arnett DK, et al., 2019) is: 1.7%   Values used to calculate the score:     Age: 20 years     Sex: Female     Is Non-Hispanic African American: No     Diabetic: No     Tobacco smoker: No     Systolic Blood Pressure: 355 mmHg     Is BP treated: Yes     HDL Cholesterol: 39 mg/dL     Total Cholesterol: 193 mg/dL  Health Maintenance: -Blood work UTD -Pap in 1/23 -Mammogram 3/23 Birads-2  Past Medical History:  Diagnosis Date   Allergy    Arthritis    Asthma    Chronic pelvic pain in female    Endometriosis    FH: breast cancer in first degree relative    Mother and sister both are BRCA positive; patient is BRCA negative   GERD (gastroesophageal reflux disease)    Headache    migraines   Heart murmur    Hyperlipidemia    Hypertension    Interstitial  cystitis    Irritable bowel disease    Neck pain     Past Surgical History:  Procedure Laterality Date   BREAST BIOPSY Left 02/08/2017   CHOLECYSTECTOMY     LAPAROSCOPY  2006   TUBAL LIGATION      Family History  Problem Relation Age of Onset   Cancer Mother 53       breast   Breast cancer Mother 85   Cancer Sister 51   Breast cancer Sister 24   Kidney disease Son    Heart attack Maternal Grandfather    Heart attack Paternal Grandmother    Heart attack Paternal Grandfather     Social History   Socioeconomic History   Marital status: Married    Spouse name: Anita Weaver   Number of children: 3   Years of education: 12   Highest education level: Not on file  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  Tobacco Use   Smoking status: Never   Smokeless tobacco: Never  Vaping Use   Vaping Use: Never used  Substance and Sexual Activity   Alcohol use: Yes    Alcohol/week: 0.0 standard drinks of alcohol    Comment: occasionally   Drug use: No  Sexual activity: Yes    Partners: Male    Birth control/protection: Surgical    Comment: tubaligation  Other Topics Concern   Not on file  Social History Narrative   ** Merged History Encounter **       ** Data from: 02/09/14 Enc Dept: [facility]       ** Data from: 11/10/13 Enc Dept: [facility]   Patient lives at home with her husband Anita Weaver)   Patient works full time.   Right handed.   Caffeine- none   Education- high school   Social Determinants of Health   Financial Resource Strain: Not on file  Food Insecurity: Not on file  Transportation Needs: Not on file  Physical Activity: Not on file  Stress: Not on file  Social Connections: Not on file  Intimate Partner Violence: Not on file    Outpatient Medications Prior to Visit  Medication Sig Dispense Refill   amLODipine (NORVASC) 10 MG tablet Take 1 tablet (10 mg total) by mouth daily. 30 tablet 0   cetirizine (ZYRTEC) 10 MG chewable tablet Chew 1 tablet  (10 mg total) by mouth daily. 30 tablet 3   fluticasone (FLONASE) 50 MCG/ACT nasal spray Place 2 sprays into both nostrils daily. 16 g 6   levalbuterol (XOPENEX HFA) 45 MCG/ACT inhaler Inhale 2 puffs into the lungs every 8 (eight) hours as needed for wheezing or shortness of breath. 1 each 2   lurasidone (LATUDA) 40 MG TABS tablet TAKE 1 TABLET (40 MG TOTAL) BY MOUTH DAILY WITH BREAKFAST. 90 tablet 3   medroxyPROGESTERone (DEPO-PROVERA) 150 MG/ML injection Inject 1 mL (150 mg total) into the muscle every 3 (three) months. 1 mL 3   nortriptyline (PAMELOR) 10 MG capsule Take 40 mg by mouth at bedtime.     ondansetron (ZOFRAN) 4 MG tablet Take one tablet every 8 hours as needed. #10 to last 30 days. For migraines. 20 tablet 2   pantoprazole (PROTONIX) 40 MG tablet TAKE 1 TABLET BY MOUTH EVERY DAY 30 tablet 4   rizatriptan (MAXALT) 10 MG tablet Take 1 tablet (10 mg total) by mouth as needed for migraine. May repeat in 2 hours if needed 10 tablet 3   No facility-administered medications prior to visit.    Allergies  Allergen Reactions   Imitrex [Sumatriptan] Other (See Comments)    Heart palpitations, hot/cold sweats   Demerol [Meperidine] Other (See Comments)    hallucinations   Flagyl [Metronidazole] Hives    ROS Review of Systems  Constitutional:  Negative for chills and fever.  HENT:  Negative for congestion.   Eyes:  Negative for visual disturbance.  Respiratory:  Negative for cough and shortness of breath.   Cardiovascular:  Positive for chest pain. Negative for leg swelling.  Neurological:  Positive for headaches. Negative for dizziness.  Psychiatric/Behavioral:  The patient is nervous/anxious.       Objective:    Physical Exam Constitutional:      Appearance: Normal appearance.  HENT:     Head: Normocephalic and atraumatic.  Eyes:     Conjunctiva/sclera: Conjunctivae normal.  Cardiovascular:     Rate and Rhythm: Normal rate and regular rhythm.  Pulmonary:     Effort:  Pulmonary effort is normal.     Breath sounds: Normal breath sounds.  Musculoskeletal:     Right lower leg: No edema.     Left lower leg: No edema.  Skin:    General: Skin is warm and dry.  Neurological:  General: No focal deficit present.     Mental Status: She is alert. Mental status is at baseline.  Psychiatric:        Mood and Affect: Mood normal.        Behavior: Behavior normal.     BP 118/74   Pulse (!) 114   Temp (!) 97.5 F (36.4 C)   Resp 16   Ht _0  (1.727 m)   Wt 212 lb 14.4 oz (96.6 kg)   SpO2 98%   BMI 32.37 kg/m  Wt Readings from Last 3 Encounters:  09/19/21 212 lb 14.4 oz (96.6 kg)  07/18/21 210 lb (95.3 kg)  06/24/21 214 lb 8 oz (97.3 kg)     Health Maintenance Due  Topic Date Due   PAP SMEAR-Modifier  12/06/2019   COVID-19 Vaccine (2 - Pfizer series) 04/15/2020   COLONOSCOPY (Pts 45-44yr Insurance coverage will need to be confirmed)  Never done    There are no preventive care reminders to display for this patient.  Lab Results  Component Value Date   TSH 2.00 05/14/2020   Lab Results  Component Value Date   WBC 9.1 06/24/2021   HGB 14.8 06/24/2021   HCT 42.8 06/24/2021   MCV 87.9 06/24/2021   PLT 342 06/24/2021   Lab Results  Component Value Date   NA 139 06/24/2021   K 3.6 06/24/2021   CO2 25 06/24/2021   GLUCOSE 102 (H) 06/24/2021   BUN 9 06/24/2021   CREATININE 0.99 06/24/2021   BILITOT 0.5 06/24/2021   ALKPHOS 41 (L) 03/03/2013   AST 15 06/24/2021   ALT 21 06/24/2021   PROT 7.0 06/24/2021   ALBUMIN 3.8 03/03/2013   CALCIUM 9.4 06/24/2021   ANIONGAP 6 (L) 03/03/2013   EGFR 71 06/24/2021   Lab Results  Component Value Date   CHOL 193 06/24/2021   Lab Results  Component Value Date   HDL 39 (L) 06/24/2021   Lab Results  Component Value Date   LDLCALC 129 (H) 06/24/2021   Lab Results  Component Value Date   TRIG 141 06/24/2021   Lab Results  Component Value Date   CHOLHDL 4.9 06/24/2021   Lab Results   Component Value Date   HGBA1C 4.6 01/23/2019      Assessment & Plan:   1. Hypertension, unspecified type: Blood pressure at goal here today, has been good at home the last 2-3 days but had been higher before with some chest pain. Patient is anxious over health and has been stressed, which seems to make BP worse. Reassurance provided, based on blood pressures here I am comfortable keeping her Amlodipine at 10 mg daily. We discussed how to appropriately check BP at home. Amlodipine 10 mg refilled today.   - amLODipine (NORVASC) 10 MG tablet; Take 1 tablet (10 mg total) by mouth daily.  Dispense: 90 tablet; Refill: 0  2. Chest pain, unspecified type: EKG showing borderline sinus tachycardia with HR 100, enlarged P waves but no ST elevations. Reassuring EKG.   - EKG 12-Lead  3. Hyperlipidemia, unspecified hyperlipidemia type: Discussed lipid panel results at length, recommend weight loss, diet modification and increasing exercise. Plan to recheck fasting lipid panel at follow up.   Follow-up: Return in about 3 months (around 12/20/2021).    ETeodora Medici DO

## 2021-09-19 NOTE — Patient Instructions (Addendum)
It was great seeing you today!  Plan discussed at today's visit: -Blood pressure good here today -Continue Amlodipine 10 mg daily -Continue to check blood pressure at home  -Work on weight loss, diet change and increasing activity level -Next time fast for 8-12 hours to check cholesterol   Follow up in: 3 months   Take care and let us know if you have any questions or concerns prior to your next visit.  Dr. Rosana Berger

## 2021-10-04 ENCOUNTER — Ambulatory Visit: Payer: 59

## 2021-10-05 ENCOUNTER — Ambulatory Visit (INDEPENDENT_AMBULATORY_CARE_PROVIDER_SITE_OTHER): Payer: 59 | Admitting: Obstetrics & Gynecology

## 2021-10-05 VITALS — BP 144/94 | HR 106

## 2021-10-05 DIAGNOSIS — Z3042 Encounter for surveillance of injectable contraceptive: Secondary | ICD-10-CM | POA: Diagnosis not present

## 2021-10-05 MED ORDER — MEDROXYPROGESTERONE ACETATE 150 MG/ML IM SUSP
150.0000 mg | Freq: Once | INTRAMUSCULAR | Status: AC
  Administered 2021-10-05: 150 mg via INTRAMUSCULAR

## 2021-10-05 NOTE — Progress Notes (Signed)
Patient was assessed and managed by nursing staff during this encounter. I have reviewed the chart and agree with the documentation and plan. I have also made any necessary editorial changes.  Verita Schneiders, MD 10/05/2021 4:01 PM

## 2021-10-05 NOTE — Progress Notes (Signed)
Date last pap: 10/25/2015. Last Depo-Provera: 07/18/2021. Side Effects if any: none. Serum HCG indicated? N/A. Depo-Provera 150 mg IM given by: Toya L CMA . Next appointment due 09/13/-09/27.  Pt states B/P is being managed by Primary Care provider. Pt will let us know if she will continue Depo.

## 2021-10-06 ENCOUNTER — Telehealth: Payer: Self-pay | Admitting: Internal Medicine

## 2021-10-06 NOTE — Telephone Encounter (Signed)
Patient was lft a message to schedule an appt per [place] for high blood pressure. Tried to give her an appt for today 10-06-2021 and for 10-07-2021 but patient said that she needed very late for she works. She finally made one for Monday but told her she really needs to be seen sooner, but she said that her BP is always like this.

## 2021-10-10 ENCOUNTER — Encounter: Payer: Self-pay | Admitting: Nurse Practitioner

## 2021-10-10 ENCOUNTER — Ambulatory Visit: Payer: 59 | Admitting: Nurse Practitioner

## 2021-10-10 ENCOUNTER — Other Ambulatory Visit: Payer: Self-pay

## 2021-10-10 VITALS — BP 138/96 | HR 82 | Temp 98.1°F | Resp 16 | Ht 68.0 in | Wt 212.2 lb

## 2021-10-10 DIAGNOSIS — I10 Essential (primary) hypertension: Secondary | ICD-10-CM

## 2021-10-10 MED ORDER — LOSARTAN POTASSIUM 25 MG PO TABS: 25.0000 mg | ORAL_TABLET | Freq: Every day | ORAL | 0 refills | Status: DC

## 2021-10-10 NOTE — Addendum Note (Signed)
Addended by: Serafina Royals F on: 10/10/2021 03:49 PM   Modules accepted: Level of Service

## 2021-10-10 NOTE — Assessment & Plan Note (Signed)
Continue taking amlodipine 10 mg daily and add losartan 25 mg daily.  Check blood pressure once or twice a week.  Bring log to next appointment.

## 2021-10-10 NOTE — Progress Notes (Signed)
BP (!) 138/96   Pulse 82   Temp 98.1 F (36.7 C)   Resp 16   Ht 5' 8"  (1.727 m)   Wt 212 lb 3.2 oz (96.3 kg)   SpO2 94%   BMI 32.26 kg/m    Subjective:    Patient ID: Anita Weaver, female    DOB: [birth year], 46 y.o.   MRN: [id number]  HPI: Anita Weaver is a 45 y.o. female  Chief Complaint  Patient presents with   Hypertension   Hypertension: Patient's blood pressure today is 138/96.  Patient reports that her blood pressure has been running 150s/90s at home.  Patient did bring her blood pressure cuff to appointment and it got  148/103.  Patient reports that she has had headaches.  Patient did not report any chest pain or shortness of breath.  Patient is currently taking amlodipine 10 mg daily.  Patient did report that when she took lisinopril she did have a cough. Patient states she was told that the cough was related to post nasal drip not the lisinopril.   We will place patient on losartan 25 mg daily.  Relevant past medical, surgical, family and social history reviewed and updated as indicated. Interim medical history since our last visit reviewed. Allergies and medications reviewed and updated.  Review of Systems  Constitutional: Negative for fever or weight change.  Respiratory: Negative for cough and shortness of breath.   Cardiovascular: Negative for chest pain or palpitations.  Gastrointestinal: Negative for abdominal pain, no bowel changes.  Musculoskeletal: Negative for gait problem or joint swelling.  Skin: Negative for rash.  Neurological: Negative for dizziness, positive for  headache.  No other specific complaints in a complete review of systems (except as listed in HPI above).      Objective:    BP (!) 138/96   Pulse 82   Temp 98.1 F (36.7 C)   Resp 16   Ht 5' 8"  (1.727 m)   Wt 212 lb 3.2 oz (96.3 kg)   SpO2 94%   BMI 32.26 kg/m   Wt Readings from Last 3 Encounters:  10/10/21 212 lb 3.2 oz (96.3 kg)  09/19/21 212 lb 14.4 oz (96.6 kg)  07/18/21  210 lb (95.3 kg)    Physical Exam  Constitutional: Patient appears well-developed and well-nourished. Obese  No distress.  HEENT: head atraumatic, normocephalic, pupils equal and reactive to light,  neck supple Cardiovascular: Normal rate, regular rhythm and normal heart sounds.  No murmur heard. No BLE edema. Pulmonary/Chest: Effort normal and breath sounds normal. No respiratory distress. Abdominal: Soft.  There is no tenderness. Psychiatric: Patient has a normal mood and affect. behavior is normal. Judgment and thought content normal.   Results for orders placed or performed in visit on 06/24/21  CBC w/Diff/Platelet  Result Value Ref Range   WBC 9.1 3.8 - 10.8 Thousand/uL   RBC 4.87 3.80 - 5.10 Million/uL   Hemoglobin 14.8 11.7 - 15.5 g/dL   HCT 42.8 35.0 - 45.0 %   MCV 87.9 80.0 - 100.0 fL   MCH 30.4 27.0 - 33.0 pg   MCHC 34.6 32.0 - 36.0 g/dL   RDW 12.3 11.0 - 15.0 %   Platelets 342 140 - 400 Thousand/uL   MPV 8.9 7.5 - 12.5 fL   Neutro Abs 4,641 1,500 - 7,800 cells/uL   Lymphs Abs 3,458 850 - 3,900 cells/uL   Absolute Monocytes 582 200 - 950 cells/uL   Eosinophils Absolute 382 15 - 500  cells/uL   Basophils Absolute 36 0 - 200 cells/uL   Neutrophils Relative % 51 %   Total Lymphocyte 38.0 %   Monocytes Relative 6.4 %   Eosinophils Relative 4.2 %   Basophils Relative 0.4 %  COMPLETE METABOLIC PANEL WITH GFR  Result Value Ref Range   Glucose, Bld 102 (H) 65 - 99 mg/dL   BUN 9 7 - 25 mg/dL   Creat 0.99 0.50 - 0.99 mg/dL   eGFR 71 > OR = 60 mL/min/1.89m   BUN/Creatinine Ratio NOT APPLICABLE 6 - 22 (calc)   Sodium 139 135 - 146 mmol/L   Potassium 3.6 3.5 - 5.3 mmol/L   Chloride 104 98 - 110 mmol/L   CO2 25 20 - 32 mmol/L   Calcium 9.4 8.6 - 10.2 mg/dL   Total Protein 7.0 6.1 - 8.1 g/dL   Albumin 4.4 3.6 - 5.1 g/dL   Globulin 2.6 1.9 - 3.7 g/dL (calc)   AG Ratio 1.7 1.0 - 2.5 (calc)   Total Bilirubin 0.5 0.2 - 1.2 mg/dL   Alkaline phosphatase (APISO) 50 31 - 125 U/L    AST 15 10 - 35 U/L   ALT 21 6 - 29 U/L  Lipid Profile  Result Value Ref Range   Cholesterol 193 <200 mg/dL   HDL 39 (L) > OR = 50 mg/dL   Triglycerides 141 <150 mg/dL   LDL Cholesterol (Calc) 129 (H) mg/dL (calc)   Total CHOL/HDL Ratio 4.9 <5.0 (calc)   Non-HDL Cholesterol (Calc) 154 (H) <130 mg/dL (calc)      Assessment & Plan:   Problem List Items Addressed This Visit       Cardiovascular and Mediastinum   Hypertension - Primary    Continue taking amlodipine 10 mg daily and add losartan 25 mg daily.  Check blood pressure once or twice a week.  Bring log to next appointment.      Relevant Medications   losartan (COZAAR) 25 MG tablet     Follow up plan: Return in about 4 weeks (around 11/07/2021) for follow up.

## 2021-10-24 ENCOUNTER — Encounter: Payer: Self-pay | Admitting: Urology

## 2021-10-24 ENCOUNTER — Ambulatory Visit: Payer: 59 | Admitting: Urology

## 2021-10-24 VITALS — BP 141/94 | HR 120 | Ht 68.0 in | Wt 210.0 lb

## 2021-10-24 DIAGNOSIS — R350 Frequency of micturition: Secondary | ICD-10-CM | POA: Diagnosis not present

## 2021-10-24 DIAGNOSIS — R3129 Other microscopic hematuria: Secondary | ICD-10-CM | POA: Diagnosis not present

## 2021-10-24 LAB — BLADDER SCAN AMB NON-IMAGING: Scan Result: 2

## 2021-10-24 MED ORDER — MIRABEGRON ER 50 MG PO TB24: 50.0000 mg | ORAL_TABLET | Freq: Every day | ORAL | 0 refills | Status: DC

## 2021-10-24 NOTE — Progress Notes (Signed)
10/24/2021 2:40 PM   Anita Weaver [birth year] [id number]  Referring provider: Teodora Medici, [place] [address] [place],  [place] [address]  No chief complaint on file.   HPI: I was consulted to assess the patient frequency and flow symptoms.  She voids every hour due to urgency.  She was told she interstitial cystitis decades ago.  No dyspareunia.  She gets up 3-5 times at night.  She is continent that she holds it too long.  Flow sometimes good sometimes poor.  Sometimes she has a days.  She does not hesitate if her bladder is full.  She sometimes does not feel empty.  Sometimes stops and starts.  No hysterectomy and prone to constipation  No neurologic issues.  No history of kidney stones bladder surgery or bladder infections   PMH: Past Medical History:  Diagnosis Date   Allergy    Arthritis    Asthma    Chronic pelvic pain in female    Endometriosis    FH: breast cancer in first degree relative    Mother and sister both are BRCA positive; patient is BRCA negative   GERD (gastroesophageal reflux disease)    Headache    migraines   Heart murmur    Hyperlipidemia    Hypertension    Interstitial cystitis    Irritable bowel disease    Neck pain     Surgical History: Past Surgical History:  Procedure Laterality Date   BREAST BIOPSY Left 02/08/2017   CHOLECYSTECTOMY     LAPAROSCOPY  2006   TUBAL LIGATION      Home Medications:  Allergies as of 10/24/2021       Reactions   Imitrex [sumatriptan] Other (See Comments)   Heart palpitations, hot/cold sweats   Demerol [meperidine] Other (See Comments)   hallucinations   Flagyl [metronidazole] Hives        Medication List        Accurate as of October 24, 2021  2:40 PM. If you have any questions, ask your nurse or doctor.          amLODipine 10 MG tablet Commonly known as: NORVASC Take 1 tablet (10 mg total) by mouth daily.   cetirizine 10 MG chewable tablet Commonly known as:  ZyrTEC Chew 1 tablet (10 mg total) by mouth daily.   fluticasone 50 MCG/ACT nasal spray Commonly known as: FLONASE Place 2 sprays into both nostrils daily.   levalbuterol 45 MCG/ACT inhaler Commonly known as: XOPENEX HFA Inhale 2 puffs into the lungs every 8 (eight) hours as needed for wheezing or shortness of breath.   losartan 25 MG tablet Commonly known as: COZAAR Take 1 tablet (25 mg total) by mouth daily.   lurasidone 40 MG Tabs tablet Commonly known as: Latuda TAKE 1 TABLET (40 MG TOTAL) BY MOUTH DAILY WITH BREAKFAST.   medroxyPROGESTERone 150 MG/ML injection Commonly known as: DEPO-PROVERA Inject 1 mL (150 mg total) into the muscle every 3 (three) months.   nortriptyline 10 MG capsule Commonly known as: PAMELOR Take 40 mg by mouth at bedtime.   ondansetron 4 MG tablet Commonly known as: ZOFRAN Take one tablet every 8 hours as needed. #10 to last 30 days. For migraines.   pantoprazole 40 MG tablet Commonly known as: PROTONIX TAKE 1 TABLET BY MOUTH EVERY DAY   rizatriptan 10 MG tablet Commonly known as: MAXALT Take 1 tablet (10 mg total) by mouth as needed for migraine. May repeat in 2 hours if needed  Allergies:  Allergies  Allergen Reactions   Imitrex [Sumatriptan] Other (See Comments)    Heart palpitations, hot/cold sweats   Demerol [Meperidine] Other (See Comments)    hallucinations   Flagyl [Metronidazole] Hives    Family History: Family History  Problem Relation Age of Onset   Cancer Mother 63       breast   Breast cancer Mother 65   Cancer Sister 64   Breast cancer Sister 47   Kidney disease Son    Heart attack Maternal Grandfather    Heart attack Paternal Grandmother    Heart attack Paternal Grandfather     Social History:  reports that she has never smoked. She has never used smokeless tobacco. She reports current alcohol use. She reports that she does not use drugs.  ROS:                                         Physical Exam: There were no vitals taken for this visit.  Constitutional:  Alert and oriented, No acute distress. HEENT: [place] AT, moist mucus membranes.  Trachea midline, no masses.  Laboratory Data: Lab Results  Component Value Date   WBC 9.1 06/24/2021   HGB 14.8 06/24/2021   HCT 42.8 06/24/2021   MCV 87.9 06/24/2021   PLT 342 06/24/2021    Lab Results  Component Value Date   CREATININE 0.99 06/24/2021    No results found for: "PSA"  No results found for: "TESTOSTERONE"  Lab Results  Component Value Date   HGBA1C 4.6 01/23/2019    Urinalysis    Component Value Date/Time   COLORURINE Yellow 03/03/2013 1545   APPEARANCEUR Cloudy 03/03/2013 1545   LABSPEC 1.021 03/03/2013 1545   PHURINE 7.0 03/03/2013 1545   GLUCOSEU Negative 03/03/2013 1545   HGBUR 1+ 03/03/2013 1545   BILIRUBINUR neg 08/05/2019 1513   BILIRUBINUR Negative 03/03/2013 1545   KETONESUR Negative 03/03/2013 1545   PROTEINUR Negative 08/05/2019 1513   PROTEINUR Negative 03/03/2013 1545   UROBILINOGEN 0.2 08/05/2019 1513   NITRITE neg 08/05/2019 1513   NITRITE Negative 03/03/2013 1545   LEUKOCYTESUR Negative 08/05/2019 1513   LEUKOCYTESUR Negative 03/03/2013 1545    Pertinent Imaging: Urine positive for bacteria and blood in the urine.  Recent urine culture negative .  Assessment & Plan: Patient has frequency urgency uncommon urge incontinence flow symptoms and microscopic hematuria.  Patient will return for CT scan and cystoscopy and pelvic examination.  I thought was reasonable to give her Myrbetriq 50 mg samples and prescription and perform this evaluation over the next 6 weeks.     Blood thinners.  No daily aspirin.  Has never smoked.  She said cystoscopy heard her years ago.  Even though she has no pelvic pain or symptoms of interstitial cystitis she did rather that I do not perform cystoscopy next visit with a pelvic exam.  I will call if the CT is abnormal and she will try the  Myrbetriq.  Call if culture positive.  Differential diagnosis and usual work-up was stressed.  There are no diagnoses linked to this encounter.  No follow-ups on file.  Reece Packer, MD  [place] [address], [place], [place] [address] [phone]

## 2021-10-25 ENCOUNTER — Encounter: Payer: Self-pay | Admitting: Urology

## 2021-10-25 LAB — URINALYSIS, COMPLETE
Bilirubin, UA: NEGATIVE
Glucose, UA: NEGATIVE
Ketones, UA: NEGATIVE
Nitrite, UA: NEGATIVE
Specific Gravity, UA: 1.02 (ref 1.005–1.030)
Urobilinogen, Ur: 0.2 mg/dL (ref 0.2–1.0)
pH, UA: 5.5 (ref 5.0–7.5)

## 2021-10-25 LAB — MICROSCOPIC EXAMINATION: Epithelial Cells (non renal): >10 /hpf — AB (ref 0–10)

## 2021-10-27 ENCOUNTER — Telehealth: Payer: Self-pay | Admitting: Internal Medicine

## 2021-10-27 NOTE — Telephone Encounter (Signed)
Copied from [place] [id number]. Topic: General - Other >> Oct 27, 2021  2:54 PM Everette C wrote: Reason for CRM: The patient has been recently prescribed mirabegron ER (MYRBETRIQ) 50 MG TB24 tablet [[id number]]  by their urologist   The patient shares that they're currently being seen by their PCP for blood pressure concerns   The patient would like to speak with a member of clinical staff when possible prior to starting the medication   The patient has a 1 month supply of samples of the medication   Please contact further when possible

## 2021-10-27 NOTE — Telephone Encounter (Signed)
Called patient she states was told by urologist that mybetriq can cause high blood pressure.  Pt was concerned about starting since hers is already elevated.  I told her to call urologist ask if there is anything else available she could try and if not, she could try and just monitor her bp daily and document readings to see if it would effect it.  Pt will contact urlogy

## 2021-10-31 ENCOUNTER — Ambulatory Visit: Payer: Self-pay

## 2021-10-31 NOTE — Telephone Encounter (Signed)
Chief Complaint: High BP readings Symptoms: None Frequency: Ongoing Pertinent Negatives: Patient denies any symptoms. Disposition: '[]'$ ED /'[]'$ Urgent Care (no appt availability in office) / '[x]'$ Appointment(In office/virtual)/ '[]'$  [facility]/ '[]'$ Home Care/ '[]'$ Refused Recommended Disposition /'[]'$ [facility]/ '[]'$  Follow-up with PCP Additional Notes: Patient called and says her BP yesterday 134/96, no symptoms, no way to check it now since not at home, no missed doses of new BP prescribed on 10/10/21, wants OV with Dr. Rosana Berger. Advised she's on vacation, scheduled for Wednesday with Serafina Royals.   Reason for Disposition  [5] Systolic BP  >= 638 OR Diastolic >= 80 AND [9] taking BP medications  Answer Assessment - Initial Assessment Questions 1. BLOOD PRESSURE: "What is the blood pressure?" "Did you take at least two measurements 5 minutes apart?"     134/96 (yesterday) 2. ONSET: "When did you take your blood pressure?"     Yesterday 3. HOW: "How did you take your blood pressure?" (e.g., automatic home BP monitor, visiting nurse)     Home BP monitor 4. HISTORY: "Do you have a history of high blood pressure?"     Yes 5. MEDICINES: "Are you taking any medicines for blood pressure?" "Have you missed any doses recently?"     Yes, no missed doses 6. OTHER SYMPTOMS: "Do you have any symptoms?" (e.g., blurred vision, chest pain, difficulty breathing, headache, weakness)     No 7. PREGNANCY: "Is there any chance you are pregnant?" "When was your last menstrual period?"     No  Protocols used: Blood Pressure - High-A-AH

## 2021-11-02 ENCOUNTER — Encounter: Payer: Self-pay | Admitting: Nurse Practitioner

## 2021-11-02 ENCOUNTER — Ambulatory Visit: Payer: 59 | Admitting: Nurse Practitioner

## 2021-11-02 ENCOUNTER — Other Ambulatory Visit: Payer: Self-pay | Admitting: Nurse Practitioner

## 2021-11-02 VITALS — BP 132/84 | HR 100 | Temp 98.6°F | Resp 18 | Ht 68.0 in | Wt 213.7 lb

## 2021-11-02 DIAGNOSIS — I10 Essential (primary) hypertension: Secondary | ICD-10-CM

## 2021-11-02 NOTE — Progress Notes (Signed)
BP 132/84   Pulse 100   Temp 98.6 F (37 C)   Resp 18   Ht '5\' 8"'$  (1.727 m)   Wt 213 lb 11.2 oz (96.9 kg)   PF 98 L/min   BMI 32.49 kg/m    Subjective:    Patient ID: Anita Weaver, female    DOB: [birth year], 46 y.o.   MRN: [id number]  HPI: Anita Weaver is a 46 y.o. female  Chief Complaint  Patient presents with   Hypertension   Hypertension: Patient blood pressure today is 132/84.  She states her blood pressure has been running 140/96, 134/96.  Patient denies any chest pain, shortness of breath, headaches or blurred vision.  I last saw this patient on 10/10/2021 and started her on losartan 25 mg daily.  She was already taking amlodipine 10 mg daily.  Plan is to get a large blood pressure cuff and keep a log to see if it is the size of the cuff giving her higher readings. If she continues to have higher readings will likely increase losartan. Patient is agreeable to plan.   Relevant past medical, surgical, family and social history reviewed and updated as indicated. Interim medical history since our last visit reviewed. Allergies and medications reviewed and updated.  Review of Systems  Constitutional: Negative for fever or weight change.  Respiratory: Negative for cough and shortness of breath.   Cardiovascular: Negative for chest pain or palpitations.  Gastrointestinal: Negative for abdominal pain, no bowel changes.  Musculoskeletal: Negative for gait problem or joint swelling.  Skin: Negative for rash.  Neurological: Negative for dizziness or headache.  No other specific complaints in a complete review of systems (except as listed in HPI above).      Objective:    BP 132/84   Pulse 100   Temp 98.6 F (37 C)   Resp 18   Ht '5\' 8"'$  (1.727 m)   Wt 213 lb 11.2 oz (96.9 kg)   PF 98 L/min   BMI 32.49 kg/m   Wt Readings from Last 3 Encounters:  11/02/21 213 lb 11.2 oz (96.9 kg)  10/24/21 210 lb (95.3 kg)  10/10/21 212 lb 3.2 oz (96.3 kg)    Physical  Exam  Constitutional: Patient appears well-developed and well-nourished. Obese  No distress.  HEENT: head atraumatic, normocephalic, pupils equal and reactive to light, neck supple Cardiovascular: Normal rate, regular rhythm and normal heart sounds.  No murmur heard. No BLE edema. Pulmonary/Chest: Effort normal and breath sounds normal. No respiratory distress. Abdominal: Soft.  There is no tenderness. Psychiatric: Patient has a normal mood and affect. behavior is normal. Judgment and thought content normal.   Results for orders placed or performed in visit on 10/24/21  Microscopic Examination   Urine  Result Value Ref Range   WBC, UA 11-30 (A) 0 - 5 /hpf   RBC, Urine 11-30 (A) 0 - 2 /hpf   Epithelial Cells (non renal) >10 (A) 0 - 10 /hpf   Bacteria, UA Many (A) None seen/Few  Urinalysis, Complete  Result Value Ref Range   Specific Gravity, UA 1.020 1.005 - 1.030   pH, UA 5.5 5.0 - 7.5   Color, UA Yellow Yellow   Appearance Ur Clear Clear   Leukocytes,UA 1+ (A) Negative   Protein,UA 1+ (A) Negative/Trace   Glucose, UA Negative Negative   Ketones, UA Negative Negative   RBC, UA 2+ (A) Negative   Bilirubin, UA Negative Negative   Urobilinogen, Ur 0.2 0.2 -  1.0 mg/dL   Nitrite, UA Negative Negative   Microscopic Examination See below:   Bladder Scan (Post Void Residual) in office  Result Value Ref Range   Scan Result 2       Assessment & Plan:   Problem List Items Addressed This Visit       Cardiovascular and Mediastinum   Hypertension - Primary    Discussed with patient regarding getting a larger blood pressure cuff to check her blood pressures at home.  Patient is going to keep a log after she gets new blood pressure cuff.  If blood pressure continues to have higher readings will increase losartan.        Follow up plan: Patient already has a follow-up appointment scheduled.

## 2021-11-02 NOTE — Assessment & Plan Note (Signed)
Discussed with patient regarding getting a larger blood pressure cuff to check her blood pressures at home.  Patient is going to keep a log after she gets new blood pressure cuff.  If blood pressure continues to have higher readings will increase losartan.

## 2021-11-03 NOTE — Telephone Encounter (Signed)
Requested Prescriptions  Pending Prescriptions Disp Refills  . losartan (COZAAR) 25 MG tablet [Pharmacy Med Name: LOSARTAN POTASSIUM 25 MG TAB] 90 tablet 0    Sig: TAKE 1 TABLET (25 MG TOTAL) BY MOUTH DAILY.     Cardiovascular:  Angiotensin Receptor Blockers Failed - 11/02/2021 12:31 PM      Failed - Last BP in normal range    BP Readings from Last 1 Encounters:  11/02/21 132/84         Passed - Cr in normal range and within 180 days    Creat  Date Value Ref Range Status  06/24/2021 0.99 0.50 - 0.99 mg/dL Final         Passed - K in normal range and within 180 days    Potassium  Date Value Ref Range Status  06/24/2021 3.6 3.5 - 5.3 mmol/L Final  03/03/2013 3.7 3.5 - 5.1 mmol/L Final         Passed - Patient is not pregnant      Passed - Valid encounter within last 6 months    Recent Outpatient Visits          Yesterday Hypertension, unspecified type   [place], FNP   3 weeks ago Hypertension, unspecified type   [facility] Bo Merino, FNP   1 month ago Hypertension, unspecified type   [place], DO   4 months ago Hypertension, unspecified type   [facility] Teodora Medici, DO   5 months ago Hypertension, unspecified type   [facility] Teodora Medici, DO      Future Appointments            In 1 month Lucilla Lame, MD [place]   In 1 month Teodora Medici, [facility], [facility]

## 2021-11-04 ENCOUNTER — Other Ambulatory Visit: Payer: Self-pay

## 2021-11-04 ENCOUNTER — Other Ambulatory Visit: Payer: Self-pay | Admitting: Nurse Practitioner

## 2021-11-04 DIAGNOSIS — I10 Essential (primary) hypertension: Secondary | ICD-10-CM

## 2021-11-08 ENCOUNTER — Other Ambulatory Visit: Payer: Self-pay | Admitting: Internal Medicine

## 2021-11-08 MED ORDER — LOSARTAN POTASSIUM 50 MG PO TABS: 50.0000 mg | ORAL_TABLET | Freq: Every day | ORAL | 1 refills | Status: DC

## 2021-11-10 ENCOUNTER — Ambulatory Visit: Payer: 59 | Admitting: Internal Medicine

## 2021-12-04 ENCOUNTER — Telehealth: Payer: Self-pay | Admitting: Internal Medicine

## 2021-12-05 NOTE — Telephone Encounter (Signed)
Requested Prescriptions  Pending Prescriptions Disp Refills  . losartan (COZAAR) 50 MG tablet [Pharmacy Med Name: LOSARTAN POTASSIUM 50 MG TAB] 30 tablet 1    Sig: TAKE 1 TABLET BY MOUTH EVERY DAY     Cardiovascular:  Angiotensin Receptor Blockers Passed - 12/04/2021 12:33 PM      Passed - Cr in normal range and within 180 days    Creat  Date Value Ref Range Status  06/24/2021 0.99 0.50 - 0.99 mg/dL Final         Passed - K in normal range and within 180 days    Potassium  Date Value Ref Range Status  06/24/2021 3.6 3.5 - 5.3 mmol/L Final  03/03/2013 3.7 3.5 - 5.1 mmol/L Final         Passed - Patient is not pregnant      Passed - Last BP in normal range    BP Readings from Last 1 Encounters:  11/02/21 132/84         Passed - Valid encounter within last 6 months    Recent Outpatient Visits          1 month ago Hypertension, unspecified type   [facility] Bo Merino, FNP   1 month ago Hypertension, unspecified type   [facility] Bo Merino, FNP   2 months ago Hypertension, unspecified type   [place], DO   5 months ago Hypertension, unspecified type   [facility] Teodora Medici, DO   6 months ago Hypertension, unspecified type   [facility] Teodora Medici, DO      Future Appointments            In 2 weeks Lucilla Lame, MD [place]   In 3 weeks Teodora Medici, [facility], [facility]

## 2021-12-15 ENCOUNTER — Encounter: Payer: Self-pay | Admitting: Adult Health

## 2021-12-15 ENCOUNTER — Ambulatory Visit: Payer: 59 | Admitting: Adult Health

## 2021-12-15 DIAGNOSIS — F3130 Bipolar disorder, current episode depressed, mild or moderate severity, unspecified: Secondary | ICD-10-CM

## 2021-12-15 DIAGNOSIS — G4700 Insomnia, unspecified: Secondary | ICD-10-CM | POA: Diagnosis not present

## 2021-12-15 DIAGNOSIS — F411 Generalized anxiety disorder: Secondary | ICD-10-CM | POA: Diagnosis not present

## 2021-12-15 MED ORDER — HYDROXYZINE HCL 10 MG PO TABS: 10.0000 mg | ORAL_TABLET | Freq: Three times a day (TID) | ORAL | 2 refills | Status: DC | PRN

## 2021-12-15 MED ORDER — LURASIDONE HCL 40 MG PO TABS: ORAL_TABLET | ORAL | 3 refills | Status: DC

## 2021-12-15 NOTE — Progress Notes (Signed)
Anita Weaver [id number] December 19, 1975 46 y.o.  Subjective:   Patient ID:  Anita Weaver is a 46 y.o. (DOB 1975-07-05) female.  Chief Complaint: No chief complaint on file.   HPI Anita Weaver presents to the office today for follow-up of BPD 1, anxiety and insomnia.  Accompanied by husband  Describes mood today as "ok". Pleasant. Reports some tearfulness. Mood symptoms - reports some depression, anxiety, and irritability. Mood is consistent. Stating "I'm not doing as well as I was". Feels like she needs help managing anxiety and is willing to consider options. Feels like the Latuda at 1m daily continues to work well for her.  She and family doing well. Stable interest and motivation. Taking medications as prescribed.  Energy levels lower - feels tired. Active, does not have a regular exercise routine.  Enjoys some usual interests and activities. Married. Lives with husband. Has 3 grown sons - 3 grandchildren - sons[place]. Weight loss - 208 from 214 pounds - 5' 8" . Sleeps well most nights. Averages 8 hours. Focus and concentration difficulties. Completing tasks. Managing aspects of household. Works full time as a pAir traffic controller  Denies SI or HI.  Denies AH or VH.   [facility] PHQ-2 Total Score 0 0 0 0 0  PHQ-9 Total Score 0 -- 0 0 0        Review of Systems:  Review of Systems  Musculoskeletal:  Negative for gait problem.  Neurological:  Negative for tremors.  Psychiatric/Behavioral:         Please refer to HPI    Medications: I have reviewed the patient's current medications.  Current Outpatient Medications   Medication Sig Dispense Refill   hydrOXYzine (ATARAX) 10 MG tablet Take 1 tablet (10 mg total) by mouth 3 (three) times daily as needed. 30 tablet 2   amLODipine (NORVASC) 10 MG tablet Take 1 tablet (10 mg total) by mouth daily. 90 tablet 0   cetirizine (ZYRTEC) 10 MG chewable tablet Chew 1 tablet (10 mg total) by mouth daily. 30 tablet 3   fluticasone (FLONASE) 50 MCG/ACT nasal spray Place 2 sprays into both nostrils daily. 16 g 6   levalbuterol (XOPENEX HFA) 45 MCG/ACT inhaler Inhale 2 puffs into the lungs every 8 (eight) hours as needed for wheezing or shortness of breath. 1 each 2   losartan (COZAAR) 50 MG tablet Take 1 tablet (50 mg total) by mouth daily. 30 tablet 1   lurasidone (LATUDA) 40 MG TABS tablet TAKE 1 TABLET (40 MG TOTAL) BY MOUTH DAILY WITH BREAKFAST. 90 tablet 3   medroxyPROGESTERone (DEPO-PROVERA) 150 MG/ML injection Inject 1 mL (150 mg total) into the muscle every 3 (three) months. 1 mL 3   mirabegron ER (MYRBETRIQ) 50 MG TB24 tablet Take 1 tablet (50 mg total) by mouth daily. (Patient not taking: Reported on 11/02/2021) 30 tablet 0   nortriptyline (PAMELOR) 50 MG capsule Take 50 mg by mouth at bedtime.     ondansetron (ZOFRAN) 4 MG tablet Take one tablet every 8 hours as needed. #10 to last 30 days. For migraines. 20 tablet 2   pantoprazole (PROTONIX) 40 MG tablet TAKE 1 TABLET BY MOUTH EVERY DAY  30 tablet 4   rizatriptan (MAXALT) 10 MG tablet Take 1 tablet (10 mg total) by mouth as needed for migraine. May repeat in 2 hours if needed 10 tablet 3   No current facility-administered medications for this visit.    Medication Side Effects: None  Allergies:  Allergies  Allergen Reactions   Imitrex [Sumatriptan] Other (See Comments)    Heart palpitations, hot/cold sweats   Demerol [Meperidine] Other (See Comments)    hallucinations   Flagyl [Metronidazole] Hives    Past Medical History:  Diagnosis Date   Allergy    Arthritis    Asthma    Chronic pelvic pain in  female    Endometriosis    FH: breast cancer in first degree relative    Mother and sister both are BRCA positive; patient is BRCA negative   GERD (gastroesophageal reflux disease)    Headache    migraines   Heart murmur    Hyperlipidemia    Hypertension    Interstitial cystitis    Irritable bowel disease    Neck pain     Past Medical History, Surgical history, Social history, and Family history were reviewed and updated as appropriate.   Please see review of systems for further details on the patient's review from today.   Objective:   Physical Exam:  There were no vitals taken for this visit.  Physical Exam Constitutional:      General: She is not in acute distress. Musculoskeletal:        General: No deformity.  Neurological:     Mental Status: She is alert and oriented to person, place, and time.     Coordination: Coordination normal.  Psychiatric:        Attention and Perception: Attention and perception normal. She does not perceive auditory or visual hallucinations.        Mood and Affect: Mood normal. Mood is not anxious or depressed. Affect is not labile, blunt, angry or inappropriate.        Speech: Speech normal.        Behavior: Behavior normal.        Thought Content: Thought content normal. Thought content is not paranoid or delusional. Thought content does not include homicidal or suicidal ideation. Thought content does not include homicidal or suicidal plan.        Cognition and Memory: Cognition and memory normal.        Judgment: Judgment normal.     Comments: Insight intact     Lab Review:     Component Value Date/Time   NA 139 06/24/2021 1519   NA 139 03/03/2013 1545   K 3.6 06/24/2021 1519   K 3.7 03/03/2013 1545   CL 104 06/24/2021 1519   CL 105 03/03/2013 1545   CO2 25 06/24/2021 1519   CO2 28 03/03/2013 1545   GLUCOSE 102 (H) 06/24/2021 1519   GLUCOSE 106 (H) 03/03/2013 1545   BUN 9 06/24/2021 1519   BUN 11 03/03/2013 1545    CREATININE 0.99 06/24/2021 1519   CALCIUM 9.4 06/24/2021 1519   CALCIUM 8.8 03/03/2013 1545   PROT 7.0 06/24/2021 1519   PROT 7.3 03/03/2013 1545   ALBUMIN 3.8 03/03/2013 1545   AST 15 06/24/2021 1519   AST 8 (L) 03/03/2013 1545   ALT 21 06/24/2021 1519   ALT 15 03/03/2013 1545   ALKPHOS 41 (L) 03/03/2013 1545   BILITOT 0.5 06/24/2021 1519   BILITOT 0.4 03/03/2013 1545   GFRNONAA 79 05/14/2020 1532  GFRAA 91 05/14/2020 1532       Component Value Date/Time   WBC 9.1 06/24/2021 1519   RBC 4.87 06/24/2021 1519   HGB 14.8 06/24/2021 1519   HGB 13.8 03/03/2013 1545   HCT 42.8 06/24/2021 1519   HCT 40.2 03/03/2013 1545   PLT 342 06/24/2021 1519   PLT 288 03/03/2013 1545   MCV 87.9 06/24/2021 1519   MCV 85 03/03/2013 1545   MCH 30.4 06/24/2021 1519   MCHC 34.6 06/24/2021 1519   RDW 12.3 06/24/2021 1519   RDW 13.3 03/03/2013 1545   LYMPHSABS 3,458 06/24/2021 1519   LYMPHSABS 2.9 03/03/2013 1545   MONOABS 752 01/20/2016 1603   MONOABS 0.6 03/03/2013 1545   EOSABS 382 06/24/2021 1519   EOSABS 0.2 03/03/2013 1545   BASOSABS 36 06/24/2021 1519   BASOSABS 0.0 03/03/2013 1545    No results found for: "POCLITH", "LITHIUM"   No results found for: "PHENYTOIN", "PHENOBARB", "VALPROATE", "CBMZ"   .res Assessment: Plan:    Plan:  1. Latuda 32m daily  2. Add Hydroxyzine 181mTID prn anxiety  RTC 6 months  Patient advised to contact office with any questions, adverse effects, or acute worsening in signs and symptoms.   Time spent with patient was 15 minutes. Greater than 50% of face to face time with patient was spent on counseling and coordination of care.    Discussed potential metabolic side effects associated with atypical antipsychotics, as well as potential risk for movement side effects. Advised pt to contact office if movement side effects occur.   Diagnoses and all orders for this visit:  Generalized anxiety disorder -     hydrOXYzine (ATARAX) 10 MG tablet;  Take 1 tablet (10 mg total) by mouth 3 (three) times daily as needed.  Bipolar I disorder, most recent episode depressed (HCC) -     lurasidone (LATUDA) 40 MG TABS tablet; TAKE 1 TABLET (40 MG TOTAL) BY MOUTH DAILY WITH BREAKFAST.  Insomnia, unspecified type -     lurasidone (LATUDA) 40 MG TABS tablet; TAKE 1 TABLET (40 MG TOTAL) BY MOUTH DAILY WITH BREAKFAST.     Please see After Visit Summary for patient specific instructions.  Future Appointments  Date Time Provider [place]9/03/2022  3:15 PM WoLucilla LameMD AGI-AGIB None  12/29/2021  8:20 AM AnTeodora MediciDO [facility]  12/29/2021  3:50 PM [facility] NURSE [facility]  01/09/2022  1:30 PM MaBjorn LoserMD BUA-BUA None  06/15/2022  4:20 PM Lennon Boutwell, ReBerdie OgrenNP CP-CP None    No orders of the defined types were placed in this encounter.   -------------------------------

## 2021-12-16 ENCOUNTER — Telehealth: Payer: Self-pay | Admitting: Adult Health

## 2021-12-16 NOTE — Telephone Encounter (Signed)
Pt called and said that she has heart problems and that the hydroxyzine 10 mg says that if you have heart issues not take this medication. She wants to know do you still want her to take this medication or change to a different medicine. Please call her at [phone]

## 2021-12-16 NOTE — Telephone Encounter (Addendum)
Patient is asking about the hydroxyzine. She said it mentions not taking if you have heart issues. She said her doctor has not discussed EKG with her, but 09/19/21 EKG (in Epic) shows borderline sinus tachycardia and enlarged P waves. Is it okay for her to take hydroxyzine or does she need to discuss with her doctor first?

## 2021-12-16 NOTE — Telephone Encounter (Signed)
Patient notified

## 2021-12-16 NOTE — Telephone Encounter (Signed)
Discussed.

## 2021-12-19 ENCOUNTER — Ambulatory Visit: Payer: 59 | Admitting: Adult Health

## 2021-12-19 ENCOUNTER — Ambulatory Visit: Payer: 59 | Admitting: Urology

## 2021-12-20 ENCOUNTER — Encounter: Payer: Self-pay | Admitting: Gastroenterology

## 2021-12-20 ENCOUNTER — Ambulatory Visit: Payer: 59 | Admitting: Gastroenterology

## 2021-12-20 DIAGNOSIS — R0600 Dyspnea, unspecified: Secondary | ICD-10-CM | POA: Diagnosis not present

## 2021-12-20 MED ORDER — PANTOPRAZOLE SODIUM 40 MG PO TBEC: 40.0000 mg | DELAYED_RELEASE_TABLET | Freq: Every day | ORAL | 11 refills | Status: DC

## 2021-12-20 NOTE — Progress Notes (Signed)
Primary Care Physician: Teodora Medici, DO  Primary Gastroenterologist:  Dr. Lucilla Lame  Chief Complaint  Patient presents with   Follow-up   Gastroesophageal Reflux    Pt reports she is doing well on Protonix QD    HPI: Anita Weaver is a 46 y.o. female here with a history of GERD.  The patient is here to refill her Protonix.  The patient denies having a colonoscopy in the last 10 years.  She denies any dysphagia nausea vomiting fevers chills black stools or bloody stools.  She also denies any unexplained weight loss.  Past Medical History:  Diagnosis Date   Allergy    Arthritis    Asthma    Chronic pelvic pain in female    Endometriosis    FH: breast cancer in first degree relative    Mother and sister both are BRCA positive; patient is BRCA negative   GERD (gastroesophageal reflux disease)    Headache    migraines   Heart murmur    Hyperlipidemia    Hypertension    Interstitial cystitis    Irritable bowel disease    Neck pain     Current Outpatient Medications  Medication Sig Dispense Refill   amLODipine (NORVASC) 10 MG tablet Take 1 tablet (10 mg total) by mouth daily. 90 tablet 0   cetirizine (ZYRTEC) 10 MG chewable tablet Chew 1 tablet (10 mg total) by mouth daily. 30 tablet 3   fluticasone (FLONASE) 50 MCG/ACT nasal spray Place 2 sprays into both nostrils daily. 16 g 6   hydrOXYzine (ATARAX) 10 MG tablet Take 1 tablet (10 mg total) by mouth 3 (three) times daily as needed. 30 tablet 2   levalbuterol (XOPENEX HFA) 45 MCG/ACT inhaler Inhale 2 puffs into the lungs every 8 (eight) hours as needed for wheezing or shortness of breath. 1 each 2   losartan (COZAAR) 50 MG tablet Take 1 tablet (50 mg total) by mouth daily. 30 tablet 1   lurasidone (LATUDA) 40 MG TABS tablet TAKE 1 TABLET (40 MG TOTAL) BY MOUTH DAILY WITH BREAKFAST. 90 tablet 3   medroxyPROGESTERone (DEPO-PROVERA) 150 MG/ML injection Inject 1 mL (150 mg total) into the muscle every 3 (three) months. 1  mL 3   mirabegron ER (MYRBETRIQ) 50 MG TB24 tablet Take 1 tablet (50 mg total) by mouth daily. (Patient not taking: Reported on 11/02/2021) 30 tablet 0   nortriptyline (PAMELOR) 50 MG capsule Take 50 mg by mouth at bedtime.     ondansetron (ZOFRAN) 4 MG tablet Take one tablet every 8 hours as needed. #10 to last 30 days. For migraines. 20 tablet 2   pantoprazole (PROTONIX) 40 MG tablet Take 1 tablet (40 mg total) by mouth daily. 30 tablet 11   rizatriptan (MAXALT) 10 MG tablet Take 1 tablet (10 mg total) by mouth as needed for migraine. May repeat in 2 hours if needed 10 tablet 3   No current facility-administered medications for this visit.    Allergies as of 12/20/2021 - Review Complete 12/15/2021  Allergen Reaction Noted   Imitrex [sumatriptan] Other (See Comments) 04/24/2014   Demerol [meperidine] Other (See Comments) 04/18/2012   Flagyl [metronidazole] Hives 04/15/2015    ROS:  General: Negative for anorexia, weight loss, fever, chills, fatigue, weakness. ENT: Negative for hoarseness, difficulty swallowing , nasal congestion. CV: Negative for chest pain, angina, palpitations, dyspnea on exertion, peripheral edema.  Respiratory: Negative for dyspnea at rest, dyspnea on exertion, cough, sputum, wheezing.  GI: See history  of present illness. GU:  Negative for dysuria, hematuria, urinary incontinence, urinary frequency, nocturnal urination.  Endo: Negative for unusual weight change.    Physical Examination:   BP 131/86   Pulse 89   Temp 98.4 F (36.9 C) (Oral)   Wt 212 lb (96.2 kg)   BMI 32.23 kg/m   General: Well-nourished, well-developed in no acute distress.  Eyes: No icterus. Conjunctivae pink. Neuro: Alert and oriented x 3.  Grossly intact. Psych: Alert and cooperative, normal mood and affect.  Labs:    Imaging Studies: No results found.  Assessment and Plan:   Anita Weaver is a 46 y.o. y/o female who comes in today with a history of GERD doing well on her  Protonix.  The patient needs a refill of Protonix.  The patient has no worry symptoms at the present time.  She has been told to contact me if she has any worry symptoms such as dysphagia or unexplained weight loss or black stools or bloody stools.  The patient has also been encouraged to contact me if she decides to undergo a colonoscopy since she has not had what in 10 years.  The patient states she will think about it and contact me if she decides to go through with it.  The patient has been explained the plan and agrees with it.     Lucilla Lame, MD. [name]    Note: This dictation was prepared with Dragon dictation along with smaller phrase technology. Any transcriptional errors that result from this process are unintentional.

## 2021-12-23 ENCOUNTER — Other Ambulatory Visit: Payer: Self-pay | Admitting: Internal Medicine

## 2021-12-23 DIAGNOSIS — I10 Essential (primary) hypertension: Secondary | ICD-10-CM

## 2021-12-23 NOTE — Telephone Encounter (Signed)
Pt called reporting that she has to attend a funeral, she had to reschedule her appt. Says she is running low on these medications below, advised this may be too soon to request.  Medication Refill - Medication: losartan (COZAAR) 50 MG tablet   amLODipine (NORVASC) 10 MG tablet [  Has the patient contacted their pharmacy? Yes.   (Agent: If no, request that the patient contact the pharmacy for the refill. If patient does not wish to contact the pharmacy document the reason why and proceed with request.) (Agent: If yes, when and what did the pharmacy advise?)  Preferred Pharmacy (with phone number or street name):  Has the patient been seen for an appointment in the last year [id number] [place] Phone: [phone]: [phone]  OR does the patient have an upcoming appointment? Yes.    Agent: Please be advised that RX refills may take up to 3 business days. We ask that you follow-up with your pharmacy.

## 2021-12-26 MED ORDER — AMLODIPINE BESYLATE 10 MG PO TABS: 10.0000 mg | ORAL_TABLET | Freq: Every day | ORAL | 0 refills | Status: DC

## 2021-12-26 MED ORDER — LOSARTAN POTASSIUM 50 MG PO TABS: 50.0000 mg | ORAL_TABLET | Freq: Every day | ORAL | 1 refills | Status: DC

## 2021-12-26 NOTE — Telephone Encounter (Signed)
Requested Prescriptions  Pending Prescriptions Disp Refills  . amLODipine (NORVASC) 10 MG tablet 90 tablet 0    Sig: Take 1 tablet (10 mg total) by mouth daily.     Cardiovascular: Calcium Channel Blockers 2 Passed - 12/23/2021  4:46 PM      Passed - Last BP in normal range    BP Readings from Last 1 Encounters:  12/20/21 131/86         Passed - Last Heart Rate in normal range    Pulse Readings from Last 1 Encounters:  12/20/21 89         Passed - Valid encounter within last 6 months    Recent Outpatient Visits          1 month ago Hypertension, unspecified type   [facility] Bo Merino, FNP   2 months ago Hypertension, unspecified type   [facility] Bo Merino, FNP   3 months ago Hypertension, unspecified type   [place], DO   6 months ago Hypertension, unspecified type   [facility] Teodora Medici, DO   7 months ago Hypertension, unspecified type   [facility] Teodora Medici, DO      Future Appointments            In 2 weeks Teodora Medici, [facility], [facility]           . losartan (COZAAR) 50 MG tablet 30 tablet 1    Sig: Take 1 tablet (50 mg total) by mouth daily.     Cardiovascular:  Angiotensin Receptor Blockers Failed - 12/23/2021  4:46 PM      Failed - Cr in normal range and within 180 days    Creat  Date Value Ref Range Status  06/24/2021 0.99 0.50 - 0.99 mg/dL Final         Failed - K in normal range and within 180 days    Potassium  Date Value Ref Range Status  06/24/2021 3.6 3.5 - 5.3 mmol/L Final  03/03/2013 3.7 3.5 - 5.1 mmol/L Final         Passed - Patient is not pregnant      Passed - Last BP in normal range    BP Readings from Last 1 Encounters:  12/20/21 131/86         Passed - Valid encounter within last 6 months    Recent Outpatient Visits          1 month ago  Hypertension, unspecified type   [facility] Bo Merino, FNP   2 months ago Hypertension, unspecified type   [facility], FNP   3 months ago Hypertension, unspecified type   [place], DO   6 months ago Hypertension, unspecified type   [place], DO   7 months ago Hypertension, unspecified type   [facility] Teodora Medici, DO      Future Appointments            In 2 weeks Teodora Medici, [facility], [facility]

## 2021-12-28 ENCOUNTER — Ambulatory Visit: Payer: 59

## 2021-12-29 ENCOUNTER — Ambulatory Visit: Payer: 59 | Admitting: Internal Medicine

## 2021-12-29 ENCOUNTER — Ambulatory Visit: Payer: 59

## 2021-12-30 ENCOUNTER — Ambulatory Visit (INDEPENDENT_AMBULATORY_CARE_PROVIDER_SITE_OTHER): Payer: 59

## 2021-12-30 VITALS — BP 108/78 | HR 106

## 2021-12-30 DIAGNOSIS — Z3042 Encounter for surveillance of injectable contraceptive: Secondary | ICD-10-CM

## 2021-12-30 MED ORDER — MEDROXYPROGESTERONE ACETATE 150 MG/ML IM SUSP
150.0000 mg | INTRAMUSCULAR | Status: DC
  Administered 2021-12-30 – 2022-06-05 (×2): 150 mg via INTRAMUSCULAR

## 2021-12-30 NOTE — Progress Notes (Signed)
Date last pap: 10/25/15. Last Depo-Provera: 10/05/21. Side Effects if any: None . Serum HCG indicated? N/A. Depo-Provera 150 mg IM given by: Toya L CMA. Next appointment due 12/8-12/22.

## 2022-01-04 ENCOUNTER — Other Ambulatory Visit: Payer: Self-pay

## 2022-01-04 ENCOUNTER — Telehealth: Payer: Self-pay

## 2022-01-04 DIAGNOSIS — Z1211 Encounter for screening for malignant neoplasm of colon: Secondary | ICD-10-CM

## 2022-01-04 MED ORDER — NA SULFATE-K SULFATE-MG SULF 17.5-3.13-1.6 GM/177ML PO SOLN: 1.0000 | Freq: Once | ORAL | 0 refills | Status: AC

## 2022-01-04 NOTE — Addendum Note (Signed)
Addended by: Vanetta Mulders on: 01/04/2022 03:51 PM   Modules accepted: Orders

## 2022-01-04 NOTE — Telephone Encounter (Signed)
Patient lvm to schedule colonoscopy in December.  She was seen by Dr. Allen Norris on 07/27/20 for GERD, Chronic Constipation.  No previous colonoscopy noted in chart.  Please advise as to if its okay to proceed with scheduling screening colonoscopy and if she request an EGD as well would it be okay to add an EGD.  Thanks,  [place], [place]

## 2022-01-04 NOTE — Telephone Encounter (Signed)
Patient stated the medication Dr. Allen Norris prescribed to help with GERD has been working well and she would just like to move forward with screening colonoscopy.  She still experiences constipation.  I suggested a two day prep for this reason.  She has agreed to this as well.  Gastroenterology Pre-Procedure Review  Request Date: 04/06/22 Requesting Physician: Dr. Allen Norris  PATIENT REVIEW QUESTIONS: The patient responded to the following health history questions as indicated:    1. Are you having any GI issues? yes (GERD controlled with medication, Constipation despite occasional miralax.  Two day prep suggested) 2. Do you have a personal history of Polyps? no 3. Do you have a family history of Colon Cancer or Polyps? no 4. Diabetes Mellitus? no 5. Joint replacements in the past 12 months?no 6. Major health problems in the past 3 months?no 7. Any artificial heart valves, MVP, or defibrillator?no    MEDICATIONS & ALLERGIES:    Patient reports the following regarding taking any anticoagulation/antiplatelet therapy:   Plavix, Coumadin, Eliquis, Xarelto, Lovenox, Pradaxa, Brilinta, or Effient? no Aspirin? no  Patient confirms/reports the following medications:  Current Outpatient Medications  Medication Sig Dispense Refill   amLODipine (NORVASC) 10 MG tablet Take 1 tablet (10 mg total) by mouth daily. 90 tablet 0   cetirizine (ZYRTEC) 10 MG chewable tablet Chew 1 tablet (10 mg total) by mouth daily. 30 tablet 3   fluticasone (FLONASE) 50 MCG/ACT nasal spray Place 2 sprays into both nostrils daily. 16 g 6   hydrOXYzine (ATARAX) 10 MG tablet Take 1 tablet (10 mg total) by mouth 3 (three) times daily as needed. 30 tablet 2   levalbuterol (XOPENEX HFA) 45 MCG/ACT inhaler Inhale 2 puffs into the lungs every 8 (eight) hours as needed for wheezing or shortness of breath. 1 each 2   losartan (COZAAR) 50 MG tablet Take 1 tablet (50 mg total) by mouth daily. 30 tablet 1   lurasidone (LATUDA) 40 MG TABS  tablet TAKE 1 TABLET (40 MG TOTAL) BY MOUTH DAILY WITH BREAKFAST. 90 tablet 3   medroxyPROGESTERone (DEPO-PROVERA) 150 MG/ML injection Inject 1 mL (150 mg total) into the muscle every 3 (three) months. 1 mL 3   mirabegron ER (MYRBETRIQ) 50 MG TB24 tablet Take 1 tablet (50 mg total) by mouth daily. (Patient not taking: Reported on 11/02/2021) 30 tablet 0   nortriptyline (PAMELOR) 50 MG capsule Take 50 mg by mouth at bedtime.     ondansetron (ZOFRAN) 4 MG tablet Take one tablet every 8 hours as needed. #10 to last 30 days. For migraines. 20 tablet 2   pantoprazole (PROTONIX) 40 MG tablet Take 1 tablet (40 mg total) by mouth daily. 30 tablet 11   rizatriptan (MAXALT) 10 MG tablet Take 1 tablet (10 mg total) by mouth as needed for migraine. May repeat in 2 hours if needed 10 tablet 3   Current Facility-Administered Medications  Medication Dose Route Frequency Provider Last Rate Last Admin   medroxyPROGESTERone (DEPO-PROVERA) injection 150 mg  150 mg Intramuscular Q90 days Anyanwu, Ugonna A, MD   150 mg at 12/30/21 0900    Patient confirms/reports the following allergies:  Allergies  Allergen Reactions   Imitrex [Sumatriptan] Other (See Comments)    Heart palpitations, hot/cold sweats   Demerol [Meperidine] Other (See Comments)    hallucinations   Flagyl [Metronidazole] Hives    No orders of the defined types were placed in this encounter.   AUTHORIZATION INFORMATION Primary Insurance: 1D#: Group #:  Secondary Insurance: 1D#: Group #:  SCHEDULE INFORMATION: Date: 04/06/22 Time: Location: [place]

## 2022-01-09 ENCOUNTER — Encounter: Payer: Self-pay | Admitting: Urology

## 2022-01-09 ENCOUNTER — Ambulatory Visit: Payer: 59 | Admitting: Urology

## 2022-01-09 ENCOUNTER — Other Ambulatory Visit: Payer: Self-pay | Admitting: Internal Medicine

## 2022-01-10 NOTE — Telephone Encounter (Signed)
Requested medications are due for refill today.  yes  Requested medications are on the active medications list.  yes  Last refill. 11/25/2021 330 1 rf  Future visit scheduled.   yes  Notes to clinic.  Lauretta Chester listed as PCP.    Requested Prescriptions  Pending Prescriptions Disp Refills   losartan (COZAAR) 50 MG tablet [Pharmacy Med Name: LOSARTAN POTASSIUM 50 MG TAB] 30 tablet 1    Sig: TAKE 1 TABLET BY MOUTH EVERY DAY     Cardiovascular:  Angiotensin Receptor Blockers Failed - 01/09/2022  2:34 PM      Failed - Cr in normal range and within 180 days    Creat  Date Value Ref Range Status  06/24/2021 0.99 0.50 - 0.99 mg/dL Final         Failed - K in normal range and within 180 days    Potassium  Date Value Ref Range Status  06/24/2021 3.6 3.5 - 5.3 mmol/L Final  03/03/2013 3.7 3.5 - 5.1 mmol/L Final         Passed - Patient is not pregnant      Passed - Last BP in normal range    BP Readings from Last 1 Encounters:  12/30/21 108/78         Passed - Valid encounter within last 6 months    Recent Outpatient Visits           2 months ago Hypertension, unspecified type   [place], FNP   3 months ago Hypertension, unspecified type   [place], FNP   3 months ago Hypertension, unspecified type   [place], DO   6 months ago Hypertension, unspecified type   [place], DO   7 months ago Hypertension, unspecified type   [facility] Teodora Medici, DO       Future Appointments             In 2 days Teodora Medici, [facility], [facility]

## 2022-01-12 ENCOUNTER — Ambulatory Visit: Payer: 59 | Admitting: Internal Medicine

## 2022-01-12 ENCOUNTER — Encounter: Payer: Self-pay | Admitting: Internal Medicine

## 2022-01-12 VITALS — BP 124/78 | HR 102 | Temp 98.2°F | Resp 18 | Ht 68.0 in | Wt 213.6 lb

## 2022-01-12 DIAGNOSIS — I10 Essential (primary) hypertension: Secondary | ICD-10-CM

## 2022-01-12 DIAGNOSIS — G43909 Migraine, unspecified, not intractable, without status migrainosus: Secondary | ICD-10-CM

## 2022-01-12 MED ORDER — ONDANSETRON HCL 4 MG PO TABS: ORAL_TABLET | ORAL | 1 refills | Status: DC

## 2022-01-12 MED ORDER — AMLODIPINE BESYLATE 10 MG PO TABS: 10.0000 mg | ORAL_TABLET | Freq: Every day | ORAL | 0 refills | Status: DC

## 2022-01-12 MED ORDER — LOSARTAN POTASSIUM 50 MG PO TABS: 50.0000 mg | ORAL_TABLET | Freq: Every day | ORAL | 0 refills | Status: DC

## 2022-01-12 NOTE — Progress Notes (Signed)
Established Patient Office Visit  Subjective:  Patient ID: Anita Weaver, female    DOB: 1976/03/10  Age: 46 y.o. MRN: [id number]  CC:  Chief Complaint  Patient presents with   Follow-up   Hypertension    HPI Jessic R Kattner presents for blood pressure.   Hypertension: -Medications: Amlodipine 10 mg, Losartan 50 mg  -Failed Meds: thought Lisinopril due to cough but ended up being PND -Patient is compliant with above medications and reports no side effects. -Checking BP at home (average): has been good for the last few months  -Denies any SOB, vision changes, LE edema or symptoms of hypotension. Does have chest pain on and off -EKG in June reassuring. -Diagnosed with OSA 2/23 - seeing dentist 6/27 for further treatment plans, cannot tolerate CPAP  HLD: -Medications: Nothing currently  -Last lipid panel: Lipid Panel     Component Value Date/Time   CHOL 193 06/24/2021 1519   TRIG 141 06/24/2021 1519   HDL 39 (L) 06/24/2021 1519   CHOLHDL 4.9 06/24/2021 1519   VLDL 21 05/24/2015 0839   LDLCALC 129 (H) 06/24/2021 1519   The 10-year ASCVD risk score (Arnett DK, et al., 2019) is: 1.8%   Values used to calculate the score:     Age: 25 years     Sex: Female     Is Non-Hispanic African American: No     Diabetic: No     Tobacco smoker: No     Systolic Blood Pressure: 741 mmHg     Is BP treated: Yes     HDL Cholesterol: 39 mg/dL     Total Cholesterol: 193 mg/dL  Health Maintenance: -Blood work UTD -Pap in 1/23 -Mammogram 3/23 Birads-2  Past Medical History:  Diagnosis Date   Allergy    Arthritis    Asthma    Chronic pelvic pain in female    Endometriosis    FH: breast cancer in first degree relative    Mother and sister both are BRCA positive; patient is BRCA negative   GERD (gastroesophageal reflux disease)    Headache    migraines   Heart murmur    Hyperlipidemia    Hypertension    Interstitial cystitis    Irritable bowel disease    Neck pain     Past  Surgical History:  Procedure Laterality Date   BREAST BIOPSY Left 02/08/2017   CHOLECYSTECTOMY     LAPAROSCOPY  2006   TUBAL LIGATION      Family History  Problem Relation Age of Onset   Cancer Mother 54       breast   Breast cancer Mother 75   Cancer Sister 47   Breast cancer Sister 42   Kidney disease Son    Heart attack Maternal Grandfather    Heart attack Paternal Grandmother    Heart attack Paternal Grandfather     Social History   Socioeconomic History   Marital status: Married    Spouse name: [name]   Number of children: 3   Years of education: 12   Highest education level: Not on file  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  Tobacco Use   Smoking status: Never   Smokeless tobacco: Never  Vaping Use   Vaping Use: Never used  Substance and Sexual Activity   Alcohol use: Yes    Alcohol/week: 0.0 standard drinks of alcohol    Comment: occasionally   Drug use: No   Sexual activity: Yes    Partners:  Male    Birth control/protection: Surgical    Comment: tubaligation  Other Topics Concern   Not on file  Social History Narrative   ** Merged History Encounter **       ** Data from: 02/09/14 Enc Dept: [facility]       ** Data from: 11/10/13 Enc Dept: [facility]   Patient lives at home with [name]   Patient works full time.   Right handed.   Caffeine- none   Education- high school   Social Determinants of Health   Financial Resource Strain: Not on file  Food Insecurity: Not on file  Transportation Needs: Not on file  Physical Activity: Not on file  Stress: Not on file  Social Connections: Not on file  Intimate Partner Violence: Not on file    Outpatient Medications Prior to Visit  Medication Sig Dispense Refill   amLODipine (NORVASC) 10 MG tablet Take 1 tablet (10 mg total) by mouth daily. 90 tablet 0   cetirizine (ZYRTEC) 10 MG chewable tablet Chew 1 tablet (10 mg total) by mouth daily. 30 tablet 3   fluticasone  (FLONASE) 50 MCG/ACT nasal spray Place 2 sprays into both nostrils daily. 16 g 6   hydrOXYzine (ATARAX) 10 MG tablet Take 1 tablet (10 mg total) by mouth 3 (three) times daily as needed. 30 tablet 2   levalbuterol (XOPENEX HFA) 45 MCG/ACT inhaler Inhale 2 puffs into the lungs every 8 (eight) hours as needed for wheezing or shortness of breath. 1 each 2   losartan (COZAAR) 50 MG tablet Take 1 tablet (50 mg total) by mouth daily. 30 tablet 1   lurasidone (LATUDA) 40 MG TABS tablet TAKE 1 TABLET (40 MG TOTAL) BY MOUTH DAILY WITH BREAKFAST. 90 tablet 3   medroxyPROGESTERone (DEPO-PROVERA) 150 MG/ML injection Inject 1 mL (150 mg total) into the muscle every 3 (three) months. 1 mL 3   nortriptyline (PAMELOR) 50 MG capsule Take 50 mg by mouth at bedtime.     ondansetron (ZOFRAN) 4 MG tablet Take one tablet every 8 hours as needed. #10 to last 30 days. For migraines. 20 tablet 2   pantoprazole (PROTONIX) 40 MG tablet Take 1 tablet (40 mg total) by mouth daily. 30 tablet 11   rizatriptan (MAXALT) 10 MG tablet Take 1 tablet (10 mg total) by mouth as needed for migraine. May repeat in 2 hours if needed 10 tablet 3   mirabegron ER (MYRBETRIQ) 50 MG TB24 tablet Take 1 tablet (50 mg total) by mouth daily. 30 tablet 0   Facility-Administered Medications Prior to Visit  Medication Dose Route Frequency Provider Last Rate Last Admin   medroxyPROGESTERone (DEPO-PROVERA) injection 150 mg  150 mg Intramuscular Q90 days Anyanwu, Ugonna A, MD   150 mg at 12/30/21 0900    Allergies  Allergen Reactions   Imitrex [Sumatriptan] Other (See Comments)    Heart palpitations, hot/cold sweats   Demerol [Meperidine] Other (See Comments)    hallucinations   Flagyl [Metronidazole] Hives    ROS Review of Systems  Constitutional:  Negative for chills and fever.  HENT:  Negative for congestion.   Eyes:  Negative for visual disturbance.  Respiratory:  Negative for cough and shortness of breath.   Cardiovascular:  Negative  for leg swelling.  Neurological:  Negative for dizziness.      Objective:    Physical Exam Constitutional:      Appearance: Normal appearance.  HENT:     Head: Normocephalic and atraumatic.  Eyes:     Conjunctiva/sclera: Conjunctivae normal.  Cardiovascular:     Rate and Rhythm: Normal rate and regular rhythm.  Pulmonary:     Effort: Pulmonary effort is normal.     Breath sounds: Normal breath sounds.  Musculoskeletal:     Right lower leg: No edema.     Left lower leg: No edema.  Skin:    General: Skin is warm and dry.  Neurological:     General: No focal deficit present.     Mental Status: She is alert. Mental status is at baseline.  Psychiatric:        Mood and Affect: Mood normal.        Behavior: Behavior normal.     BP 124/78   Pulse (!) 102   Temp 98.2 F (36.8 C)   Resp 18   Ht _0  (1.727 m)   Wt 213 lb 9.6 oz (96.9 kg)   SpO2 100%   BMI 32.48 kg/m  Wt Readings from Last 3 Encounters:  01/12/22 213 lb 9.6 oz (96.9 kg)  12/20/21 212 lb (96.2 kg)  11/02/21 213 lb 11.2 oz (96.9 kg)     Health Maintenance Due  Topic Date Due   PAP SMEAR-Modifier  12/06/2019   COVID-19 Vaccine (2 - Pfizer series) 04/15/2020   COLONOSCOPY (Pts 45-44yr Insurance coverage will need to be confirmed)  Never done    There are no preventive care reminders to display for this patient.  Lab Results  Component Value Date   TSH 2.00 05/14/2020   Lab Results  Component Value Date   WBC 9.1 06/24/2021   HGB 14.8 06/24/2021   HCT 42.8 06/24/2021   MCV 87.9 06/24/2021   PLT 342 06/24/2021   Lab Results  Component Value Date   NA 139 06/24/2021   K 3.6 06/24/2021   CO2 25 06/24/2021   GLUCOSE 102 (H) 06/24/2021   BUN 9 06/24/2021   CREATININE 0.99 06/24/2021   BILITOT 0.5 06/24/2021   ALKPHOS 41 (L) 03/03/2013   AST 15 06/24/2021   ALT 21 06/24/2021   PROT 7.0 06/24/2021   ALBUMIN 3.8 03/03/2013   CALCIUM 9.4 06/24/2021   ANIONGAP 6 (L) 03/03/2013   EGFR 71  06/24/2021   Lab Results  Component Value Date   CHOL 193 06/24/2021   Lab Results  Component Value Date   HDL 39 (L) 06/24/2021   Lab Results  Component Value Date   LDLCALC 129 (H) 06/24/2021   Lab Results  Component Value Date   TRIG 141 06/24/2021   Lab Results  Component Value Date   CHOLHDL 4.9 06/24/2021   Lab Results  Component Value Date   HGBA1C 4.6 01/23/2019      Assessment & Plan:   1. Hypertension, unspecified type: Blood pressure appropriate today and has been stable at home for the last several weeks.  Continue current regimen of amlodipine 10 mg and losartan 50 mg daily, these both will be refilled today.  The patient will continue to monitor blood pressure at home.  Follow-up in 3 months to recheck.  - losartan (COZAAR) 50 MG tablet; Take 1 tablet (50 mg total) by mouth daily.  Dispense: 90 tablet; Refill: 0 - amLODipine (NORVASC) 10 MG tablet; Take 1 tablet (10 mg total) by mouth daily.  Dispense: 90 tablet; Refill: 0  2. Migraine without status migrainosus, not intractable, unspecified migraine type: Stable.  Patient requesting refills for Zofran to take when she is nauseous with her  migraines.  - ondansetron (ZOFRAN) 4 MG tablet; Take one tablet every 8 hours as needed. #10 to last 30 days. For migraines.  Dispense: 20 tablet; Refill: 1  Follow-up: Return in about 3 months (around 04/14/2022).    Teodora Medici, DO

## 2022-01-12 NOTE — Patient Instructions (Signed)
It was great seeing you today!  Plan discussed at today's visit: -Medications refilled today -Please prepare for fasting labs at follow up   Follow up in: 3 months   Take care and let us know if you have any questions or concerns prior to your next visit.  Dr. Rosana Berger

## 2022-03-20 ENCOUNTER — Ambulatory Visit (INDEPENDENT_AMBULATORY_CARE_PROVIDER_SITE_OTHER): Payer: 59 | Admitting: *Deleted

## 2022-03-20 VITALS — BP 131/92 | HR 114

## 2022-03-20 DIAGNOSIS — Z3042 Encounter for surveillance of injectable contraceptive: Secondary | ICD-10-CM | POA: Diagnosis not present

## 2022-03-20 MED ORDER — MEDROXYPROGESTERONE ACETATE 150 MG/ML IM SUSP
150.0000 mg | Freq: Once | INTRAMUSCULAR | Status: AC
  Administered 2022-03-20: 150 mg via INTRAMUSCULAR

## 2022-03-20 NOTE — Progress Notes (Signed)
Date last pap: 11/2016-[facility]. Last Depo-Provera: 12/30/21. Side Effects if any: None. Serum HCG indicated? NA. Depo-Provera 150 mg IM given by: Crosby Oyster, RN . Next appointment due Feb 26-Mar 12th.

## 2022-04-05 ENCOUNTER — Encounter: Payer: Self-pay | Admitting: Gastroenterology

## 2022-04-06 ENCOUNTER — Ambulatory Visit: Payer: 59 | Admitting: Anesthesiology

## 2022-04-06 ENCOUNTER — Encounter: Payer: Self-pay | Admitting: Gastroenterology

## 2022-04-06 ENCOUNTER — Ambulatory Visit
Admission: RE | Admit: 2022-04-06 | Discharge: 2022-04-06 | Disposition: A | Discharge status: Home / Self Care | Payer: 59 | Source: Ambulatory Visit | Attending: Gastroenterology | Admitting: Gastroenterology

## 2022-04-06 ENCOUNTER — Encounter
Admission: RE | Disposition: A | Discharge status: Home / Self Care | Payer: Self-pay | Source: Ambulatory Visit | Attending: Gastroenterology

## 2022-04-06 ENCOUNTER — Other Ambulatory Visit: Payer: Self-pay

## 2022-04-06 DIAGNOSIS — F319 Bipolar disorder, unspecified: Secondary | ICD-10-CM | POA: Insufficient documentation

## 2022-04-06 DIAGNOSIS — Z1211 Encounter for screening for malignant neoplasm of colon: Secondary | ICD-10-CM | POA: Diagnosis present

## 2022-04-06 DIAGNOSIS — Z803 Family history of malignant neoplasm of breast: Secondary | ICD-10-CM | POA: Diagnosis not present

## 2022-04-06 DIAGNOSIS — K219 Gastro-esophageal reflux disease without esophagitis: Secondary | ICD-10-CM | POA: Diagnosis not present

## 2022-04-06 DIAGNOSIS — K5730 Diverticulosis of large intestine without perforation or abscess without bleeding: Secondary | ICD-10-CM | POA: Diagnosis not present

## 2022-04-06 DIAGNOSIS — I10 Essential (primary) hypertension: Secondary | ICD-10-CM | POA: Insufficient documentation

## 2022-04-06 DIAGNOSIS — K589 Irritable bowel syndrome without diarrhea: Secondary | ICD-10-CM | POA: Diagnosis not present

## 2022-04-06 DIAGNOSIS — J45909 Unspecified asthma, uncomplicated: Secondary | ICD-10-CM | POA: Diagnosis not present

## 2022-04-06 DIAGNOSIS — G8929 Other chronic pain: Secondary | ICD-10-CM | POA: Insufficient documentation

## 2022-04-06 DIAGNOSIS — R102 Pelvic and perineal pain: Secondary | ICD-10-CM | POA: Insufficient documentation

## 2022-04-06 HISTORY — PX: COLONOSCOPY WITH PROPOFOL: SHX5780

## 2022-04-06 SURGERY — COLONOSCOPY WITH PROPOFOL
Anesthesia: General

## 2022-04-06 MED ORDER — PROPOFOL 1000 MG/100ML IV EMUL
INTRAVENOUS | Status: AC
  Filled 2022-04-06: qty 100

## 2022-04-06 MED ORDER — MIDAZOLAM HCL 2 MG/2ML IJ SOLN
INTRAMUSCULAR | Status: DC | PRN
  Administered 2022-04-06: 2 mg via INTRAVENOUS

## 2022-04-06 MED ORDER — LIDOCAINE HCL (CARDIAC) PF 100 MG/5ML IV SOSY
PREFILLED_SYRINGE | INTRAVENOUS | Status: DC | PRN
  Administered 2022-04-06: 50 mg via INTRAVENOUS

## 2022-04-06 MED ORDER — PROPOFOL 10 MG/ML IV BOLUS
INTRAVENOUS | Status: DC | PRN
  Administered 2022-04-06: 20 mg via INTRAVENOUS
  Administered 2022-04-06: 70 mg via INTRAVENOUS

## 2022-04-06 MED ORDER — LIDOCAINE HCL (PF) 2 % IJ SOLN
INTRAMUSCULAR | Status: AC
  Filled 2022-04-06: qty 5

## 2022-04-06 MED ORDER — SODIUM CHLORIDE 0.9 % IV SOLN: INTRAVENOUS | Status: DC

## 2022-04-06 MED ORDER — MIDAZOLAM HCL 2 MG/2ML IJ SOLN
INTRAMUSCULAR | Status: AC
  Filled 2022-04-06: qty 2

## 2022-04-06 MED ORDER — PROPOFOL 500 MG/50ML IV EMUL
INTRAVENOUS | Status: DC | PRN
  Administered 2022-04-06: 150 ug/kg/min via INTRAVENOUS

## 2022-04-06 NOTE — Anesthesia Postprocedure Evaluation (Signed)
Anesthesia Post Note  Patient: Anita Weaver  Procedure(s) Performed: COLONOSCOPY WITH PROPOFOL  Patient location during evaluation: Endoscopy Anesthesia Type: General Level of consciousness: awake and alert Pain management: pain level controlled Vital Signs Assessment: post-procedure vital signs reviewed and stable Respiratory status: spontaneous breathing, nonlabored ventilation, respiratory function stable and patient connected to nasal cannula oxygen Cardiovascular status: blood pressure returned to baseline and stable Postop Assessment: no apparent nausea or vomiting Anesthetic complications: no  No notable events documented.   Last Vitals:  Vitals:   04/06/22 0827 04/06/22 0837  BP: 104/74 106/67  Pulse: 93 92  Resp: 17 20  Temp:    SpO2: 100% 100%    Last Pain:  Vitals:   04/06/22 0837  TempSrc:   PainSc: 0-No pain                 Ilene Qua

## 2022-04-06 NOTE — H&P (Signed)
Lucilla Lame, MD [facility] [place] [address] [place], [place] [address] Phone: [phone] Fax : [phone]  Primary Care Physician:  Teodora Medici, DO Primary Gastroenterologist:  Dr. Allen Norris  Pre-Procedure History & Physical: HPI:  Anita Weaver is a 46 y.o. female is here for a screening colonoscopy.   Past Medical History:  Diagnosis Date   Allergy    Arthritis    Asthma    Chronic pelvic pain in female    Endometriosis    FH: breast cancer in first degree relative    Mother and sister both are BRCA positive; patient is BRCA negative   GERD (gastroesophageal reflux disease)    Headache    migraines   Heart murmur    Hyperlipidemia    Hypertension    Interstitial cystitis    Irritable bowel disease    Neck pain     Past Surgical History:  Procedure Laterality Date   BREAST BIOPSY Left 02/08/2017   CHOLECYSTECTOMY     LAPAROSCOPY  2006   TUBAL LIGATION      Prior to Admission medications   Medication Sig Start Date End Date Taking? Authorizing Provider  amLODipine (NORVASC) 10 MG tablet Take 1 tablet (10 mg total) by mouth daily. 01/12/22  Yes Teodora Medici, DO  losartan (COZAAR) 50 MG tablet Take 50 mg by mouth daily.   Yes [provider]  lurasidone (LATUDA) 40 MG TABS tablet TAKE 1 TABLET (40 MG TOTAL) BY MOUTH DAILY WITH BREAKFAST. 12/15/21  Yes Mozingo, Berdie Ogren, NP  pantoprazole (PROTONIX) 40 MG tablet Take 1 tablet (40 mg total) by mouth daily. 12/20/21  Yes Lucilla Lame, MD  cetirizine (ZYRTEC) 10 MG chewable tablet Chew 1 tablet (10 mg total) by mouth daily. 06/24/21   Teodora Medici, DO  fluticasone [facility]) 50 MCG/ACT nasal spray Place 2 sprays into both nostrils daily. 06/24/21   Teodora Medici, DO  hydrOXYzine (ATARAX) 10 MG tablet Take 1 tablet (10 mg total) by mouth 3 (three) times daily as needed. 12/15/21   Mozingo, Berdie Ogren, NP  levalbuterol [facility]) 45 MCG/ACT inhaler Inhale 2 puffs into the lungs every 8  (eight) hours as needed for wheezing or shortness of breath. 03/30/21 03/30/22  [name], MD  losartan (COZAAR) 50 MG tablet Take 1 tablet (50 mg total) by mouth daily. Patient not taking: Reported on 04/06/2022 01/12/22   Teodora Medici, DO  medroxyPROGESTERone (DEPO-PROVERA) 150 MG/ML injection Inject 1 mL (150 mg total) into the muscle every 3 (three) months. 02/14/21   Anyanwu, Sallyanne Havers, MD  nortriptyline (PAMELOR) 50 MG capsule Take 50 mg by mouth at bedtime. 10/31/21   [provider]  ondansetron (ZOFRAN) 4 MG tablet Take one tablet every 8 hours as needed. #10 to last 30 days. For migraines. 01/12/22   Teodora Medici, DO  rizatriptan (MAXALT) 10 MG tablet Take 1 tablet (10 mg total) by mouth as needed for migraine. May repeat in 2 hours if needed 11/12/19   Towanda Malkin, MD    Allergies as of 01/04/2022 - Review Complete 12/30/2021  Allergen Reaction Noted   Imitrex [sumatriptan] Other (See Comments) 04/24/2014   Demerol [meperidine] Other (See Comments) 04/18/2012   Flagyl [metronidazole] Hives 04/15/2015    Family History  Problem Relation Age of Onset   Cancer Mother 38       breast   Breast cancer Mother 94   Cancer Sister 52   Breast cancer Sister 79   Kidney disease Son  Heart attack Maternal Grandfather    Heart attack Paternal Grandmother    Heart attack Paternal Grandfather     Social History   Socioeconomic History   Marital status: Married    Spouse name: [name]   Number of children: 3   Years of education: 12   Highest education level: Not on file  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  Tobacco Use   Smoking status: Never   Smokeless tobacco: Never  Vaping Use   Vaping Use: Never used  Substance and Sexual Activity   Alcohol use: Yes    Alcohol/week: 0.0 standard drinks of alcohol    Comment: occasionally   Drug use: No   Sexual activity: Yes    Partners: Male    Birth control/protection: Surgical     Comment: tubaligation  Other Topics Concern   Not on file  Social History Narrative   ** Merged History Encounter **       ** Data from: 02/09/14 Enc Dept: [facility]       ** Data from: 11/10/13 Enc Dept: [facility]   Patient lives at home with [name]   Patient works full time.   Right handed.   Caffeine- none   Education- high school   Social Determinants of Health   Financial Resource Strain: Not on file  Food Insecurity: Not on file  Transportation Needs: Not on file  Physical Activity: Not on file  Stress: Not on file  Social Connections: Not on file  Intimate Partner Violence: Not on file    Review of Systems: See HPI, otherwise negative ROS  Physical Exam: BP 138/87   Pulse (!) 112   Temp (!) 97 F (36.1 C) (Temporal)   Resp 20   Ht _0  (1.727 m)   Wt 94.1 kg   SpO2 99%   BMI 31.54 kg/m  General:   Alert,  pleasant and cooperative in NAD Head:  Normocephalic and atraumatic. Neck:  Supple; no masses or thyromegaly. Lungs:  Clear throughout to auscultation.    Heart:  Regular rate and rhythm. Abdomen:  Soft, nontender and nondistended. Normal bowel sounds, without guarding, and without rebound.   Neurologic:  Alert and  oriented x4;  grossly normal neurologically.  Impression/Plan: Anita Weaver is now here to undergo a screening colonoscopy.  Risks, benefits, and alternatives regarding colonoscopy have been reviewed with the patient.  Questions have been answered.  All parties agreeable.

## 2022-04-06 NOTE — Anesthesia Procedure Notes (Signed)
Procedure Name: MAC Date/Time: 04/06/2022 7:55 AM  Performed by: Jerrye Noble, CRNAPre-anesthesia Checklist: Patient identified, Emergency Drugs available, Suction available and Patient being monitored Patient Re-evaluated:Patient Re-evaluated prior to induction Oxygen Delivery Method: Nasal cannula

## 2022-04-06 NOTE — Transfer of Care (Signed)
Immediate Anesthesia Transfer of Care Note  Patient: Anita Weaver  Procedure(s) Performed: COLONOSCOPY WITH PROPOFOL  Patient Location: PACU and Endoscopy Unit  Anesthesia Type:General  Level of Consciousness: drowsy and patient cooperative  Airway & Oxygen Therapy: Patient Spontanous Breathing  Post-op Assessment: Report given to RN and Post -op Vital signs reviewed and stable  Post vital signs: Reviewed and stable  Last Vitals:  Vitals Value Taken Time  BP 100/66   Temp    Pulse 99 04/06/22 0816  Resp    SpO2 100 % 04/06/22 0816  Vitals shown include unvalidated device data.  Last Pain:  Vitals:   04/06/22 0740  TempSrc: Temporal  PainSc: 0-No pain         Complications: No notable events documented.

## 2022-04-06 NOTE — Anesthesia Preprocedure Evaluation (Addendum)
Anesthesia Evaluation  Patient identified by MRN, date of birth, ID band Patient awake    Reviewed: Allergy & Precautions, NPO status , Patient's Chart, lab work & pertinent test results  Airway Mallampati: III  TM Distance: >3 FB Neck ROM: full    Dental no notable dental hx.    Pulmonary asthma    Pulmonary exam normal        Cardiovascular hypertension, On Medications Normal cardiovascular exam     Neuro/Psych  Headaches PSYCHIATRIC DISORDERS   Bipolar Disorder      GI/Hepatic Neg liver ROS, hiatal hernia,GERD  ,,  Endo/Other  negative endocrine ROS    Renal/GU negative Renal ROS  negative genitourinary   Musculoskeletal  (+) Arthritis ,    Abdominal   Peds  Hematology negative hematology ROS (+)   Anesthesia Other Findings Past Medical History: No date: Allergy No date: Arthritis No date: Asthma No date: Chronic pelvic pain in female No date: Endometriosis No date: FH: breast cancer in first degree relative     Comment:  Mother and sister both are BRCA positive; patient is               BRCA negative No date: GERD (gastroesophageal reflux disease) No date: Headache     Comment:  migraines No date: Heart murmur No date: Hyperlipidemia No date: Hypertension No date: Interstitial cystitis No date: Irritable bowel disease No date: Neck pain  Past Surgical History: 02/08/2017: BREAST BIOPSY; Left No date: CHOLECYSTECTOMY 2006: LAPAROSCOPY No date: TUBAL LIGATION     Reproductive/Obstetrics negative OB ROS                             Anesthesia Physical Anesthesia Plan  ASA: 2  Anesthesia Plan: General   Post-op Pain Management: Minimal or no pain anticipated   Induction: Intravenous  PONV Risk Score and Plan: Propofol infusion and TIVA  Airway Management Planned: Natural Airway and Nasal Cannula  Additional Equipment:   Intra-op Plan:   Post-operative Plan:    Informed Consent: I have reviewed the patients History and Physical, chart, labs and discussed the procedure including the risks, benefits and alternatives for the proposed anesthesia with the patient or authorized representative who has indicated his/her understanding and acceptance.     Dental Advisory Given  Plan Discussed with: Anesthesiologist, CRNA and Surgeon  Anesthesia Plan Comments: (Patient consented for risks of anesthesia including but not limited to:  - adverse reactions to medications - risk of airway placement if required - damage to eyes, teeth, lips or other oral mucosa - nerve damage due to positioning  - sore throat or hoarseness - Damage to heart, brain, nerves, lungs, other parts of body or loss of life  Patient unable to provide urine sample for pregnancy test. Discussed risks of anesthesia in pregnancy including but not limited to preterm labor, teratogenicity, fetal death.   Patient voiced understanding.)       Anesthesia Quick Evaluation

## 2022-04-06 NOTE — Op Note (Signed)
[facility] Gastroenterology Patient Name: Anita Weaver Procedure Date: 04/06/2022 7:23 AM MRN: [id number] Account #: [id number] Date of Birth: 1976/04/06 Admit Type: Outpatient Age: 46 Room: [facility] ENDO ROOM 4 Gender: Female Note Status: Finalized Instrument Name: Jasper Riling [id number] Procedure:             Colonoscopy Indications:           Screening for colorectal malignant neoplasm Providers:             Lucilla Lame MD, MD Referring MD:          Clyde Lundborg, RN (Referring MD) Medicines:             Propofol per Anesthesia Complications:         No immediate complications. Procedure:             Pre-Anesthesia Assessment:                        - Prior to the procedure, a History and Physical was                         performed, and patient medications and allergies were                         reviewed. The patient's tolerance of previous                         anesthesia was also reviewed. The risks and benefits                         of the procedure and the sedation options and risks                         were discussed with the patient. All questions were                         answered, and informed consent was obtained. Prior                         Anticoagulants: The patient has taken no anticoagulant                         or antiplatelet agents. ASA Grade Assessment: II - A                         patient with mild systemic disease. After reviewing                         the risks and benefits, the patient was deemed in                         satisfactory condition to undergo the procedure.                        After obtaining informed consent, the colonoscope was                         passed under direct vision. Throughout the procedure,  the patient's blood pressure, pulse, and oxygen                         saturations were monitored continuously. The                         Colonoscope was introduced through  the anus and                         advanced to the the cecum, identified by appendiceal                         orifice and ileocecal valve. The colonoscopy was                         performed without difficulty. The patient tolerated                         the procedure well. The quality of the bowel                         preparation was excellent. Findings:      The perianal and digital rectal examinations were normal.      A few small-mouthed diverticula were found in the sigmoid colon. Impression:            - Diverticulosis in the sigmoid colon.                        - No specimens collected. Recommendation:        - Discharge patient to home.                        - Resume previous diet.                        - Continue present medications.                        - Repeat colonoscopy in 10 years for screening                         purposes. Procedure Code(s):     --- Professional ---                        [phone], Colonoscopy, flexible; diagnostic, including                         collection of specimen(s) by brushing or washing, when                         performed (separate procedure) Diagnosis Code(s):     --- Professional ---                        Z12.11, Encounter for screening for malignant neoplasm                         of colon CPT copyright 2022 [employer] Association. All rights reserved. The codes documented in this report are preliminary and upon coder review may  be revised to meet current compliance requirements. Lucilla Lame MD, MD 04/06/2022 8:15:03 AM This report has been signed electronically. Number of Addenda: 0 Note Initiated On: 04/06/2022 7:23 AM Scope Withdrawal Time: 0 hours 13 minutes 18 seconds  Total Procedure Duration: 0 hours 17 minutes 8 seconds  Estimated Blood Loss:  Estimated blood loss: none.      [facility]

## 2022-04-07 ENCOUNTER — Encounter: Payer: Self-pay | Admitting: Gastroenterology

## 2022-04-07 ENCOUNTER — Other Ambulatory Visit: Payer: Self-pay | Admitting: Internal Medicine

## 2022-04-07 DIAGNOSIS — I10 Essential (primary) hypertension: Secondary | ICD-10-CM

## 2022-04-07 NOTE — Telephone Encounter (Signed)
Requested medication (s) are due for refill today: routing for review  Requested medication (s) are on the active medication list: yes  Last refill:  unknown  Future visit scheduled: yes  Notes to clinic:  Unable to refill per protocol, last refill by another provider. Historical medication, routing for review.     Requested Prescriptions  Pending Prescriptions Disp Refills   losartan (COZAAR) 50 MG tablet [Pharmacy Med Name: LOSARTAN POTASSIUM 50 MG TAB] 30 tablet 2    Sig: TAKE 1 TABLET BY MOUTH EVERY DAY     Cardiovascular:  Angiotensin Receptor Blockers Failed - 04/07/2022  2:03 AM      Failed - Cr in normal range and within 180 days    Creat  Date Value Ref Range Status  06/24/2021 0.99 0.50 - 0.99 mg/dL Final         Failed - K in normal range and within 180 days    Potassium  Date Value Ref Range Status  06/24/2021 3.6 3.5 - 5.3 mmol/L Final  03/03/2013 3.7 3.5 - 5.1 mmol/L Final         Passed - Patient is not pregnant      Passed - Last BP in normal range    BP Readings from Last 1 Encounters:  04/06/22 106/67         Passed - Valid encounter within last 6 months    Recent Outpatient Visits           2 months ago Hypertension, unspecified type   [place], DO   5 months ago Hypertension, unspecified type   [facility] Bo Merino, FNP   5 months ago Hypertension, unspecified type   [facility] Bo Merino, FNP   6 months ago Hypertension, unspecified type   [place], DO   9 months ago Hypertension, unspecified type   [facility] Teodora Medici, DO       Future Appointments             In 2 weeks Teodora Medici, [facility], [facility]

## 2022-04-20 ENCOUNTER — Other Ambulatory Visit: Payer: Self-pay | Admitting: Internal Medicine

## 2022-04-20 ENCOUNTER — Other Ambulatory Visit: Payer: Self-pay | Admitting: Adult Health

## 2022-04-20 ENCOUNTER — Other Ambulatory Visit: Payer: Self-pay | Admitting: Pulmonary Disease

## 2022-04-20 DIAGNOSIS — G4700 Insomnia, unspecified: Secondary | ICD-10-CM

## 2022-04-20 DIAGNOSIS — I10 Essential (primary) hypertension: Secondary | ICD-10-CM

## 2022-04-20 DIAGNOSIS — F3130 Bipolar disorder, current episode depressed, mild or moderate severity, unspecified: Secondary | ICD-10-CM

## 2022-04-21 NOTE — Telephone Encounter (Signed)
Requested Prescriptions  Pending Prescriptions Disp Refills   amLODipine (NORVASC) 10 MG tablet [Pharmacy Med Name: AMLODIPINE BESYLATE 10 MG TAB] 90 tablet 0    Sig: TAKE 1 TABLET BY MOUTH EVERY DAY     Cardiovascular: Calcium Channel Blockers 2 Passed - 04/20/2022  3:53 PM      Passed - Last BP in normal range    BP Readings from Last 1 Encounters:  04/06/22 106/67         Passed - Last Heart Rate in normal range    Pulse Readings from Last 1 Encounters:  04/06/22 92         Passed - Valid encounter within last 6 months    Recent Outpatient Visits           3 months ago Hypertension, unspecified type   [place], DO   5 months ago Hypertension, unspecified type   [facility] Bo Merino, FNP   6 months ago Hypertension, unspecified type   [place], FNP   7 months ago Hypertension, unspecified type   [place], DO   10 months ago Hypertension, unspecified type   [facility] Teodora Medici, DO       Future Appointments             In 3 days Teodora Medici, [facility], [facility]

## 2022-04-23 NOTE — Progress Notes (Unsigned)
Established Patient Office Visit  Subjective:  Patient ID: Anita Weaver, female    DOB: [birth year]  Age: 47 y.o. MRN: [id number]  CC:  No chief complaint on file.   HPI Anita Weaver presents for follow up.   Hypertension: -Medications: Amlodipine 10 mg, Losartan 50 mg  -Failed Meds: thought Lisinopril due to cough but ended up being PND -Patient is compliant with above medications and reports no side effects. -Checking BP at home (average): has been good for the last few months  -Denies any SOB, vision changes, LE edema or symptoms of hypotension. Does have chest pain on and off -EKG in June reassuring. -Diagnosed with OSA 2/23 - seeing dentist 6/27 for further treatment plans, cannot tolerate CPAP  HLD: -Medications: Nothing currently  -Last lipid panel: Lipid Panel     Component Value Date/Time   CHOL 193 06/24/2021 1519   TRIG 141 06/24/2021 1519   HDL 39 (L) 06/24/2021 1519   CHOLHDL 4.9 06/24/2021 1519   VLDL 21 05/24/2015 0839   LDLCALC 129 (H) 06/24/2021 1519   The 10-year ASCVD risk score (Arnett DK, et al., 2019) is: 1.3%   Values used to calculate the score:     Age: 80 years     Sex: Female     Is Non-Hispanic African American: No     Diabetic: No     Tobacco smoker: No     Systolic Blood Pressure: 938 mmHg     Is BP treated: Yes     HDL Cholesterol: 39 mg/dL     Total Cholesterol: 193 mg/dL  Migraines: -Maxalt PRN, Nortriptyline -Following with Neurology, last seen 02/22/22  Asthma/Allergies: -Currently on Albuterol PRN, Zyrtec and Flonase   Health Maintenance: -Blood work UTD -Pap in 1/23 -Mammogram 3/23 Birads-2  Past Medical History:  Diagnosis Date   Allergy    Arthritis    Asthma    Chronic pelvic pain in female    Endometriosis    FH: breast cancer in first degree relative    Mother and sister both are BRCA positive; patient is BRCA negative   GERD (gastroesophageal reflux disease)    Headache    migraines   Heart murmur     Hyperlipidemia    Hypertension    Interstitial cystitis    Irritable bowel disease    Neck pain     Past Surgical History:  Procedure Laterality Date   BREAST BIOPSY Left 02/08/2017   CHOLECYSTECTOMY     COLONOSCOPY WITH PROPOFOL N/A 04/06/2022   Procedure: COLONOSCOPY WITH PROPOFOL;  Surgeon: Lucilla Lame, MD;  Location: [facility] ENDOSCOPY;  Service: Endoscopy;  Laterality: N/A;   LAPAROSCOPY  2006   TUBAL LIGATION      Family History  Problem Relation Age of Onset   Cancer Mother 32       breast   Breast cancer Mother 73   Cancer Sister 65   Breast cancer Sister 42   Kidney disease Son    Heart attack Maternal Grandfather    Heart attack Paternal Grandmother    Heart attack Paternal Grandfather     Social History   Socioeconomic History   Marital status: Married    Spouse name: [name]   Number of children: 3   Years of education: 12   Highest education level: Not on file  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  Tobacco Use   Smoking status: Never   Smokeless tobacco: Never  Vaping Use   Vaping  Use: Never used  Substance and Sexual Activity   Alcohol use: Yes    Alcohol/week: 0.0 standard drinks of alcohol    Comment: occasionally   Drug use: No   Sexual activity: Yes    Partners: Male    Birth control/protection: Surgical    Comment: tubaligation  Other Topics Concern   Not on file  Social History Narrative   ** Merged History Encounter **       ** Data from: 02/09/14 Enc Dept: [facility]       ** Data from: 11/10/13 Enc Dept: [facility]   Patient lives at home with [name]   Patient works full time.   Right handed.   Caffeine- none   Education- high school   Social Determinants of Health   Financial Resource Strain: Not on file  Food Insecurity: Not on file  Transportation Needs: Not on file  Physical Activity: Not on file  Stress: Not on file  Social Connections: Not on file  Intimate Partner Violence: Not on file     Outpatient Medications Prior to Visit  Medication Sig Dispense Refill   amLODipine (NORVASC) 10 MG tablet TAKE 1 TABLET BY MOUTH EVERY DAY 90 tablet 0   cetirizine (ZYRTEC) 10 MG chewable tablet Chew 1 tablet (10 mg total) by mouth daily. 30 tablet 3   fluticasone (FLONASE) 50 MCG/ACT nasal spray Place 2 sprays into both nostrils daily. 16 g 6   hydrOXYzine (ATARAX) 10 MG tablet Take 1 tablet (10 mg total) by mouth 3 (three) times daily as needed. 30 tablet 2   levalbuterol (XOPENEX HFA) 45 MCG/ACT inhaler Inhale 2 puffs into the lungs every 8 (eight) hours as needed for wheezing or shortness of breath. 1 each 2   losartan (COZAAR) 50 MG tablet TAKE 1 TABLET BY MOUTH EVERY DAY 30 tablet 2   lurasidone (LATUDA) 40 MG TABS tablet TAKE 1 TABLET BY MOUTH DAILY WITH BREAKFAST. 90 tablet 0   medroxyPROGESTERone (DEPO-PROVERA) 150 MG/ML injection Inject 1 mL (150 mg total) into the muscle every 3 (three) months. 1 mL 3   nortriptyline (PAMELOR) 50 MG capsule Take 50 mg by mouth at bedtime.     ondansetron (ZOFRAN) 4 MG tablet Take one tablet every 8 hours as needed. #10 to last 30 days. For migraines. 20 tablet 1   pantoprazole (PROTONIX) 40 MG tablet Take 1 tablet (40 mg total) by mouth daily. 30 tablet 11   rizatriptan (MAXALT) 10 MG tablet Take 1 tablet (10 mg total) by mouth as needed for migraine. May repeat in 2 hours if needed 10 tablet 3   Facility-Administered Medications Prior to Visit  Medication Dose Route Frequency Provider Last Rate Last Admin   medroxyPROGESTERone (DEPO-PROVERA) injection 150 mg  150 mg Intramuscular Q90 days Anyanwu, Ugonna A, MD   150 mg at 12/30/21 0900    Allergies  Allergen Reactions   Imitrex [Sumatriptan] Other (See Comments)    Heart palpitations, hot/cold sweats   Demerol [Meperidine] Other (See Comments)    hallucinations   Flagyl [Metronidazole] Hives   Motrin [Ibuprofen] Other (See Comments)    swelling    ROS Review of Systems   Constitutional:  Negative for chills and fever.  HENT:  Negative for congestion.   Eyes:  Negative for visual disturbance.  Respiratory:  Negative for cough and shortness of breath.   Cardiovascular:  Negative for leg swelling.  Neurological:  Negative for dizziness.      Objective:  Physical Exam Constitutional:      Appearance: Normal appearance.  HENT:     Head: Normocephalic and atraumatic.  Eyes:     Conjunctiva/sclera: Conjunctivae normal.  Cardiovascular:     Rate and Rhythm: Normal rate and regular rhythm.  Pulmonary:     Effort: Pulmonary effort is normal.     Breath sounds: Normal breath sounds.  Musculoskeletal:     Right lower leg: No edema.     Left lower leg: No edema.  Skin:    General: Skin is warm and dry.  Neurological:     General: No focal deficit present.     Mental Status: She is alert. Mental status is at baseline.  Psychiatric:        Mood and Affect: Mood normal.        Behavior: Behavior normal.     There were no vitals taken for this visit. Wt Readings from Last 3 Encounters:  04/06/22 207 lb 6.4 oz (94.1 kg)  01/12/22 213 lb 9.6 oz (96.9 kg)  12/20/21 212 lb (96.2 kg)     Health Maintenance Due  Topic Date Due   DTaP/Tdap/Td (1 - Tdap) Never done   PAP SMEAR-Modifier  12/06/2019   COVID-19 Vaccine (3 - 2023-24 season) 12/09/2021    There are no preventive care reminders to display for this patient.  Lab Results  Component Value Date   TSH 2.00 05/14/2020   Lab Results  Component Value Date   WBC 9.1 06/24/2021   HGB 14.8 06/24/2021   HCT 42.8 06/24/2021   MCV 87.9 06/24/2021   PLT 342 06/24/2021   Lab Results  Component Value Date   NA 139 06/24/2021   K 3.6 06/24/2021   CO2 25 06/24/2021   GLUCOSE 102 (H) 06/24/2021   BUN 9 06/24/2021   CREATININE 0.99 06/24/2021   BILITOT 0.5 06/24/2021   ALKPHOS 41 (L) 03/03/2013   AST 15 06/24/2021   ALT 21 06/24/2021   PROT 7.0 06/24/2021   ALBUMIN 3.8 03/03/2013    CALCIUM 9.4 06/24/2021   ANIONGAP 6 (L) 03/03/2013   EGFR 71 06/24/2021   Lab Results  Component Value Date   CHOL 193 06/24/2021   Lab Results  Component Value Date   HDL 39 (L) 06/24/2021   Lab Results  Component Value Date   LDLCALC 129 (H) 06/24/2021   Lab Results  Component Value Date   TRIG 141 06/24/2021   Lab Results  Component Value Date   CHOLHDL 4.9 06/24/2021   Lab Results  Component Value Date   HGBA1C 4.6 01/23/2019      Assessment & Plan:   1. Hypertension, unspecified type: Blood pressure appropriate today and has been stable at home for the last several weeks.  Continue current regimen of amlodipine 10 mg and losartan 50 mg daily, these both will be refilled today.  The patient will continue to monitor blood pressure at home.  Follow-up in 3 months to recheck.  - losartan (COZAAR) 50 MG tablet; Take 1 tablet (50 mg total) by mouth daily.  Dispense: 90 tablet; Refill: 0 - amLODipine (NORVASC) 10 MG tablet; Take 1 tablet (10 mg total) by mouth daily.  Dispense: 90 tablet; Refill: 0  2. Migraine without status migrainosus, not intractable, unspecified migraine type: Stable.  Patient requesting refills for Zofran to take when she is nauseous with her migraines.  - ondansetron (ZOFRAN) 4 MG tablet; Take one tablet every 8 hours as needed. #10 to last 30 days.  For migraines.  Dispense: 20 tablet; Refill: 1  Follow-up: No follow-ups on file.    Teodora Medici, DO

## 2022-04-24 ENCOUNTER — Other Ambulatory Visit: Payer: Self-pay

## 2022-04-24 ENCOUNTER — Ambulatory Visit: Payer: 59 | Admitting: Internal Medicine

## 2022-04-24 VITALS — BP 124/76 | HR 100 | Temp 98.3°F | Resp 16 | Ht 68.0 in | Wt 211.5 lb

## 2022-04-24 DIAGNOSIS — R5383 Other fatigue: Secondary | ICD-10-CM

## 2022-04-24 DIAGNOSIS — E785 Hyperlipidemia, unspecified: Secondary | ICD-10-CM | POA: Diagnosis not present

## 2022-04-24 DIAGNOSIS — R21 Rash and other nonspecific skin eruption: Secondary | ICD-10-CM

## 2022-04-24 DIAGNOSIS — E559 Vitamin D deficiency, unspecified: Secondary | ICD-10-CM

## 2022-04-24 DIAGNOSIS — I10 Essential (primary) hypertension: Secondary | ICD-10-CM | POA: Diagnosis not present

## 2022-04-24 NOTE — Patient Instructions (Addendum)
It was great seeing you today!  Plan discussed at today's visit: -Blood work ordered today, results will be uploaded to [facility].  -Referral to Dermatology placed -Info about weight loss medication below  Follow up in: 6 months   Take care and let us know if you have any questions or concerns prior to your next visit.  Dr. Rosana Berger  Semaglutide Injection What is this medication? SEMAGLUTIDE (SEM a GLOO tide) treats type 2 diabetes. It works by increasing insulin levels in your body, which decreases your blood sugar (glucose). It also reduces the amount of sugar released into the blood and slows down your digestion. It can also be used to lower the risk of heart attack and stroke in people with type 2 diabetes. Changes to diet and exercise are often combined with this medication. This medicine may be used for other purposes; ask your health care provider or pharmacist if you have questions. COMMON BRAND NAME(S): OZEMPIC What should I tell my care team before I take this medication? They need to know if you have any of these conditions: Endocrine tumors (MEN 2) or if someone in your family had these tumors Eye disease, vision problems History of pancreatitis Kidney disease Stomach problems Thyroid cancer or if someone in your family had thyroid cancer An unusual or allergic reaction to semaglutide, other medications, foods, dyes, or preservatives Pregnant or trying to get pregnant Breast-feeding How should I use this medication? This medication is for injection under the skin of your upper leg (thigh), stomach area, or upper arm. It is given once every week (every 7 days). You will be taught how to prepare and give this medication. Use exactly as directed. Take your medication at regular intervals. Do not take it more often than directed. If you use this medication with insulin, you should inject this medication and the insulin separately. Do not mix them together. Do not give the  injections right next to each other. Change (rotate) injection sites with each injection. It is important that you put your used needles and syringes in a special sharps container. Do not put them in a trash can. If you do not have a sharps container, call your pharmacist or care team to get one. A special MedGuide will be given to you by the pharmacist with each prescription and refill. Be sure to read this information carefully each time. This medication comes with INSTRUCTIONS FOR USE. Ask your pharmacist for directions on how to use this medication. Read the information carefully. Talk to your pharmacist or care team if you have questions. Talk to your care team about the use of this medication in children. Special care may be needed. Overdosage: If you think you have taken too much of this medicine contact a poison control center or emergency room at once. NOTE: This medicine is only for you. Do not share this medicine with others. What if I miss a dose? If you miss a dose, take it as soon as you can within 5 days after the missed dose. Then take your next dose at your regular weekly time. If it has been longer than 5 days after the missed dose, do not take the missed dose. Take the next dose at your regular time. Do not take double or extra doses. If you have questions about a missed dose, contact your care team for advice. What may interact with this medication? Other medications for diabetes Many medications may cause changes in blood sugar, these include: Alcohol containing beverages  Antiviral medications for HIV or AIDS Aspirin and aspirin-like medications Certain medications for blood pressure, heart disease, irregular heart beat Chromium Diuretics Female hormones, such as estrogens or progestins, birth control pills Fenofibrate Gemfibrozil Isoniazid Lanreotide Female hormones or anabolic steroids MAOIs like Carbex, Eldepryl, Marplan, Nardil, and Parnate Medications for weight  loss Medications for allergies, asthma, cold, or cough Medications for depression, anxiety, or psychotic disturbances Niacin Nicotine NSAIDs, medications for pain and inflammation, like ibuprofen or naproxen Octreotide Pasireotide Pentamidine Phenytoin Probenecid Quinolone antibiotics such as ciprofloxacin, levofloxacin, ofloxacin Some herbal dietary supplements Steroid medications such as prednisone or cortisone Sulfamethoxazole; trimethoprim Thyroid hormones Some medications can hide the warning symptoms of low blood sugar (hypoglycemia). You may need to monitor your blood sugar more closely if you are taking one of these medications. These include: Beta-blockers, often used for high blood pressure or heart problems (examples include atenolol, metoprolol, propranolol) Clonidine Guanethidine Reserpine This list may not describe all possible interactions. Give your health care provider a list of all the medicines, herbs, non-prescription drugs, or dietary supplements you use. Also tell them if you smoke, drink alcohol, or use illegal drugs. Some items may interact with your medicine. What should I watch for while using this medication? Visit your care team for regular checks on your progress. Drink plenty of fluids while taking this medication. Check with your care team if you get an attack of severe diarrhea, nausea, and vomiting. The loss of too much body fluid can make it dangerous for you to take this medication. A test called the HbA1C (A1C) will be monitored. This is a simple blood test. It measures your blood sugar control over the last 2 to 3 months. You will receive this test every 3 to 6 months. Learn how to check your blood sugar. Learn the symptoms of low and high blood sugar and how to manage them. Always carry a quick-source of sugar with you in case you have symptoms of low blood sugar. Examples include hard sugar candy or glucose tablets. Make sure others know that you can  choke if you eat or drink when you develop serious symptoms of low blood sugar, such as seizures or unconsciousness. They must get medical help at once. Tell your care team if you have high blood sugar. You might need to change the dose of your medication. If you are sick or exercising more than usual, you might need to change the dose of your medication. Do not skip meals. Ask your care team if you should avoid alcohol. Many nonprescription cough and cold products contain sugar or alcohol. These can affect blood sugar. Pens should never be shared. Even if the needle is changed, sharing may result in passing of viruses like hepatitis or HIV. Wear a medical ID bracelet or chain, and carry a card that describes your disease and details of your medication and dosage times. Do not become pregnant while taking this medication. Women should inform their care team if they wish to become pregnant or think they might be pregnant. There is a potential for serious side effects to an unborn child. Talk to your care team for more information. What side effects may I notice from receiving this medication? Side effects that you should report to your care team as soon as possible: Allergic reactions--skin rash, itching, hives, swelling of the face, lips, tongue, or throat Change in vision Dehydration--increased thirst, dry mouth, feeling faint or lightheaded, headache, dark yellow or brown urine Gallbladder problems--severe stomach pain, nausea, vomiting,  fever Heart palpitations--rapid, pounding, or irregular heartbeat Kidney injury--decrease in the amount of urine, swelling of the ankles, hands, or feet Pancreatitis--severe stomach pain that spreads to your back or gets worse after eating or when touched, fever, nausea, vomiting Thyroid cancer--new mass or lump in the neck, pain or trouble swallowing, trouble breathing, hoarseness Side effects that usually do not require medical attention (report to your care team  if they continue or are bothersome): Diarrhea Loss of appetite Nausea Stomach pain Vomiting This list may not describe all possible side effects. Call your doctor for medical advice about side effects. You may report side effects to FDA at 1-800-FDA-1088. Where should I keep my medication? Keep out of the reach of children. Store unopened pens in a refrigerator between 2 and 8 degrees C (36 and 46 degrees F). Do not freeze. Protect from light and heat. After you first use the pen, it can be stored for 56 days at room temperature between 15 and 30 degrees C (59 and 86 degrees F) or in a refrigerator. Throw away your used pen after 56 days or after the expiration date, whichever comes first. Do not store your pen with the needle attached. If the needle is left on, medication may leak from the pen. NOTE: This sheet is a summary. It may not cover all possible information. If you have questions about this medicine, talk to your doctor, pharmacist, or health care provider.  2023 Elsevier/Gold Standard (2020-06-10 00:00:00)

## 2022-04-25 ENCOUNTER — Encounter: Payer: Self-pay | Admitting: Internal Medicine

## 2022-04-25 LAB — BASIC METABOLIC PANEL
BUN/Creatinine Ratio: 10 (calc) (ref 6–22)
BUN: 10 mg/dL (ref 7–25)
CO2: 27 mmol/L (ref 20–32)
Calcium: 9.6 mg/dL (ref 8.6–10.2)
Chloride: 103 mmol/L (ref 98–110)
Creat: 1.02 mg/dL — ABNORMAL HIGH (ref 0.50–0.99)
Glucose, Bld: 91 mg/dL (ref 65–99)
Potassium: 4.1 mmol/L (ref 3.5–5.3)
Sodium: 138 mmol/L (ref 135–146)

## 2022-04-25 LAB — CBC WITH DIFFERENTIAL/PLATELET
Absolute Monocytes: 555 cells/uL (ref 200–950)
Basophils Absolute: 64 cells/uL (ref 0–200)
Basophils Relative: 0.7 %
Eosinophils Absolute: 428 cells/uL (ref 15–500)
Eosinophils Relative: 4.7 %
HCT: 46.8 % — ABNORMAL HIGH (ref 35.0–45.0)
Hemoglobin: 16.3 g/dL — ABNORMAL HIGH (ref 11.7–15.5)
Lymphs Abs: 3039 cells/uL (ref 850–3900)
MCH: 30.2 pg (ref 27.0–33.0)
MCHC: 34.8 g/dL (ref 32.0–36.0)
MCV: 86.7 fL (ref 80.0–100.0)
MPV: 8.8 fL (ref 7.5–12.5)
Monocytes Relative: 6.1 %
Neutro Abs: 5014 cells/uL (ref 1500–7800)
Neutrophils Relative %: 55.1 %
Platelets: 425 10*3/uL — ABNORMAL HIGH (ref 140–400)
RBC: 5.4 10*6/uL — ABNORMAL HIGH (ref 3.80–5.10)
RDW: 12.5 % (ref 11.0–15.0)
Total Lymphocyte: 33.4 %
WBC: 9.1 10*3/uL (ref 3.8–10.8)

## 2022-04-25 LAB — LIPID PANEL
Cholesterol: 198 mg/dL (ref <200)
HDL: 41 mg/dL — ABNORMAL LOW (ref >=50)
LDL Cholesterol (Calc): 135 mg/dL (calc) — ABNORMAL HIGH
Non-HDL Cholesterol (Calc): 157 mg/dL (calc) — ABNORMAL HIGH (ref <130)
Total CHOL/HDL Ratio: 4.8 (calc) (ref <5.0)
Triglycerides: 113 mg/dL (ref <150)

## 2022-04-25 LAB — B12 AND FOLATE PANEL
Folate: 7 ng/mL
Vitamin B-12: 799 pg/mL (ref 200–1100)

## 2022-04-25 LAB — VITAMIN D 25 HYDROXY (VIT D DEFICIENCY, FRACTURES): Vit D, 25-Hydroxy: 30 ng/mL (ref 30–100)

## 2022-04-25 LAB — TSH: TSH: 1.87 mIU/L

## 2022-05-02 ENCOUNTER — Other Ambulatory Visit: Payer: Self-pay

## 2022-05-02 ENCOUNTER — Encounter: Payer: Self-pay | Admitting: Internal Medicine

## 2022-05-02 DIAGNOSIS — R21 Rash and other nonspecific skin eruption: Secondary | ICD-10-CM

## 2022-06-05 ENCOUNTER — Ambulatory Visit (INDEPENDENT_AMBULATORY_CARE_PROVIDER_SITE_OTHER): Payer: 59

## 2022-06-05 VITALS — BP 130/86 | HR 102

## 2022-06-05 DIAGNOSIS — Z3042 Encounter for surveillance of injectable contraceptive: Secondary | ICD-10-CM | POA: Diagnosis not present

## 2022-06-05 MED ORDER — MEDROXYPROGESTERONE ACETATE 150 MG/ML IM SUSP: 150.0000 mg | INTRAMUSCULAR | 0 refills | Status: DC

## 2022-06-05 NOTE — Progress Notes (Signed)
Date last pap: 11/2016-[facility] . Last Depo-Provera: 12.11.23. Side Effects if any: none. Serum HCG indicated? N/A. Depo-Provera 150 mg IM given by: Valerie Salts . Next appointment due 5/14-5/28.

## 2022-06-15 ENCOUNTER — Ambulatory Visit: Payer: 59 | Admitting: Adult Health

## 2022-06-15 ENCOUNTER — Encounter: Payer: Self-pay | Admitting: Adult Health

## 2022-06-15 DIAGNOSIS — F411 Generalized anxiety disorder: Secondary | ICD-10-CM

## 2022-06-15 DIAGNOSIS — G4700 Insomnia, unspecified: Secondary | ICD-10-CM

## 2022-06-15 DIAGNOSIS — F3130 Bipolar disorder, current episode depressed, mild or moderate severity, unspecified: Secondary | ICD-10-CM | POA: Diagnosis not present

## 2022-06-15 MED ORDER — LURASIDONE HCL 40 MG PO TABS: ORAL_TABLET | ORAL | 3 refills | Status: DC

## 2022-06-15 NOTE — Addendum Note (Signed)
Addended by: Aloha Gell on: 06/15/2022 04:39 PM   Modules accepted: Orders

## 2022-06-15 NOTE — Progress Notes (Signed)
Anita Weaver [id number] [birth year] 47 y.o.  Subjective:   Patient ID:  Anita Weaver is a 47 y.o. (DOB May 27, 1975) female.  Chief Complaint: No chief complaint on file.   HPI Anita Weaver presents to the office today for follow-up of  BPD 1, anxiety and insomnia.  Accompanied by husband  Describes mood today as "ok". Pleasant. Reports some tearfulness. Mood symptoms - denies depression, anxiety, and irritability. Denies worry, rumination, and over thinking. Mood is consistent. Stating "I feel like I'm doing ok". Feels like the Latuda at '40mg'$  daily continues to work well for her. Taking the Hydroxyzine as needed. She and family doing well. Stable interest and motivation. Taking medications as prescribed.  Energy levels lower - feels tired. Active, does not have a regular exercise routine.  Enjoys some usual interests and activities. Married. Lives with husband. Has 3 grown sons - 3 grandchildren - sons. Parents live in [place]. Appetite adequate. Weight stable - 212 pounds - '5\' 8"'$ . Sleeps well most nights. Averages 8 or more hours. Focus and concentration difficulties. Completing tasks. Managing aspects of household. Works full time as a Air traffic controller.  Denies SI or HI.  Denies AH or VH. Denies self harm. Denies substance use.  [place] Office Visit from 04/24/2022 in [facility] Office Visit from 01/12/2022 in [facility] Office Visit from 11/02/2021 in [facility] Office Visit from 10/10/2021 in [facility] Office Visit from 09/19/2021 in [facility]  PHQ-2 Total Score 0 0 0 0 0  PHQ-9 Total Score -- 0 0 -- 0      Flowsheet Row Admission (Discharged) from 04/06/2022 in [facility] No Risk        Review of Systems:  Review of Systems  Musculoskeletal:  Negative for gait problem.   Neurological:  Negative for tremors.  Psychiatric/Behavioral:         Please refer to HPI    Medications: I have reviewed the patient's current medications.  Current Outpatient Medications  Medication Sig Dispense Refill   amLODipine (NORVASC) 10 MG tablet TAKE 1 TABLET BY MOUTH EVERY DAY 90 tablet 0   cetirizine (ZYRTEC) 10 MG chewable tablet Chew 1 tablet (10 mg total) by mouth daily. 30 tablet 3   fluticasone (FLONASE) 50 MCG/ACT nasal spray Place 2 sprays into both nostrils daily. (Patient not taking: Reported on 04/24/2022) 16 g 6   hydrOXYzine (ATARAX) 10 MG tablet Take 1 tablet (10 mg total) by mouth 3 (three) times daily as needed. 30 tablet 2   levalbuterol (XOPENEX HFA) 45 MCG/ACT inhaler Inhale 2 puffs into the lungs every 8 (eight) hours as needed for wheezing or shortness of breath. 1 each 2   losartan (COZAAR) 50 MG tablet TAKE 1 TABLET BY MOUTH EVERY DAY 30 tablet 2   lurasidone (LATUDA) 40 MG TABS tablet TAKE 1 TABLET BY MOUTH DAILY WITH BREAKFAST. 90 tablet 3   medroxyPROGESTERone (DEPO-PROVERA) 150 MG/ML injection Inject 1 mL (150 mg total) into the muscle every 3 (three) months. 1 mL 3   medroxyPROGESTERone (DEPO-PROVERA) 150 MG/ML injection Inject 1 mL (150 mg total) into the muscle every 3 (three) months. 1 mL 0   nortriptyline (PAMELOR) 50 MG capsule Take 50 mg by mouth at bedtime.     ondansetron (ZOFRAN) 4 MG tablet Take one tablet every 8 hours as needed. #10  to last 30 days. For migraines. 20 tablet 1   pantoprazole (PROTONIX) 40 MG tablet Take 1 tablet (40 mg total) by mouth daily. 30 tablet 11   rizatriptan (MAXALT) 10 MG tablet Take 1 tablet (10 mg total) by mouth as needed for migraine. May repeat in 2 hours if needed 10 tablet 3   Current Facility-Administered Medications  Medication Dose Route Frequency Provider Last Rate Last Admin   medroxyPROGESTERone (DEPO-PROVERA) injection 150 mg  150 mg Intramuscular Q90 days Anyanwu, Ugonna A, MD   150 mg at 06/05/22  1550    Medication Side Effects: None  Allergies:  Allergies  Allergen Reactions   Imitrex [Sumatriptan] Other (See Comments)    Heart palpitations, hot/cold sweats   Demerol [Meperidine] Other (See Comments)    hallucinations   Flagyl [Metronidazole] Hives   Motrin [Ibuprofen] Other (See Comments)    swelling    Past Medical History:  Diagnosis Date   Allergy    Arthritis    Asthma    Chronic pelvic pain in female    Endometriosis    FH: breast cancer in first degree relative    Mother and sister both are BRCA positive; patient is BRCA negative   GERD (gastroesophageal reflux disease)    Headache    migraines   Heart murmur    Hyperlipidemia    Hypertension    Interstitial cystitis    Irritable bowel disease    Neck pain     Past Medical History, Surgical history, Social history, and Family history were reviewed and updated as appropriate.   Please see review of systems for further details on the patient's review from today.   Objective:   Physical Exam:  There were no vitals taken for this visit.  Physical Exam Constitutional:      General: She is not in acute distress. Musculoskeletal:        General: No deformity.  Neurological:     Mental Status: She is alert and oriented to person, place, and time.     Coordination: Coordination normal.  Psychiatric:        Attention and Perception: Attention and perception normal. She does not perceive auditory or visual hallucinations.        Mood and Affect: Mood normal. Mood is not anxious or depressed. Affect is not labile, blunt, angry or inappropriate.        Speech: Speech normal.        Behavior: Behavior normal.        Thought Content: Thought content normal. Thought content is not paranoid or delusional. Thought content does not include homicidal or suicidal ideation. Thought content does not include homicidal or suicidal plan.        Cognition and Memory: Cognition and memory normal.        Judgment:  Judgment normal.     Comments: Insight intact     Lab Review:     Component Value Date/Time   NA 138 04/24/2022 0837   NA 139 03/03/2013 1545   K 4.1 04/24/2022 0837   K 3.7 03/03/2013 1545   CL 103 04/24/2022 0837   CL 105 03/03/2013 1545   CO2 27 04/24/2022 0837   CO2 28 03/03/2013 1545   GLUCOSE 91 04/24/2022 0837   GLUCOSE 106 (H) 03/03/2013 1545   BUN 10 04/24/2022 0837   BUN 11 03/03/2013 1545   CREATININE 1.02 (H) 04/24/2022 0837   CALCIUM 9.6 04/24/2022 0837   CALCIUM 8.8 03/03/2013 1545  PROT 7.0 06/24/2021 1519   PROT 7.3 03/03/2013 1545   ALBUMIN 3.8 03/03/2013 1545   AST 15 06/24/2021 1519   AST 8 (L) 03/03/2013 1545   ALT 21 06/24/2021 1519   ALT 15 03/03/2013 1545   ALKPHOS 41 (L) 03/03/2013 1545   BILITOT 0.5 06/24/2021 1519   BILITOT 0.4 03/03/2013 1545   GFRNONAA 79 05/14/2020 1532   GFRAA 91 05/14/2020 1532       Component Value Date/Time   WBC 9.1 04/24/2022 0837   RBC 5.40 (H) 04/24/2022 0837   HGB 16.3 (H) 04/24/2022 0837   HGB 13.8 03/03/2013 1545   HCT 46.8 (H) 04/24/2022 0837   HCT 40.2 03/03/2013 1545   PLT 425 (H) 04/24/2022 0837   PLT 288 03/03/2013 1545   MCV 86.7 04/24/2022 0837   MCV 85 03/03/2013 1545   MCH 30.2 04/24/2022 0837   MCHC 34.8 04/24/2022 0837   RDW 12.5 04/24/2022 0837   RDW 13.3 03/03/2013 1545   LYMPHSABS 3,039 04/24/2022 0837   LYMPHSABS 2.9 03/03/2013 1545   MONOABS 752 01/20/2016 1603   MONOABS 0.6 03/03/2013 1545   EOSABS 428 04/24/2022 0837   EOSABS 0.2 03/03/2013 1545   BASOSABS 64 04/24/2022 0837   BASOSABS 0.0 03/03/2013 1545    No results found for: "POCLITH", "LITHIUM"   No results found for: "PHENYTOIN", "PHENOBARB", "VALPROATE", "CBMZ"   .res Assessment: Plan:    Plan:  1. Latuda '40mg'$  daily  2. Add Hydroxyzine '10mg'$  TID prn anxiety  RTC 6 months  Patient advised to contact office with any questions, adverse effects, or acute worsening in signs and symptoms.   Time spent with  patient was 15 minutes. Greater than 50% of face to face time with patient was spent on counseling and coordination of care.    Discussed potential metabolic side effects associated with atypical antipsychotics, as well as potential risk for movement side effects. Advised pt to contact office if movement side effects occur.  Diagnoses and all orders for this visit:  Generalized anxiety disorder  Bipolar I disorder, most recent episode depressed (HCC) -     lurasidone (LATUDA) 40 MG TABS tablet; TAKE 1 TABLET BY MOUTH DAILY WITH BREAKFAST.  Insomnia, unspecified type -     lurasidone (LATUDA) 40 MG TABS tablet; TAKE 1 TABLET BY MOUTH DAILY WITH BREAKFAST.     Please see After Visit Summary for patient specific instructions.  Future Appointments  Date Time Provider [place]  10/23/2022  3:00 PM Teodora Medici, DO [facility] [facility]  04/19/2023  3:00 PM Ralene Bathe, MD [facility] None    No orders of the defined types were placed in this encounter.   -------------------------------

## 2022-07-06 ENCOUNTER — Other Ambulatory Visit: Payer: Self-pay | Admitting: Internal Medicine

## 2022-07-06 DIAGNOSIS — I10 Essential (primary) hypertension: Secondary | ICD-10-CM

## 2022-07-06 NOTE — Telephone Encounter (Signed)
Requested Prescriptions  Pending Prescriptions Disp Refills   losartan (COZAAR) 50 MG tablet [Pharmacy Med Name: LOSARTAN POTASSIUM 50 MG TAB] 90 tablet 1    Sig: TAKE 1 TABLET BY MOUTH EVERY DAY     Cardiovascular:  Angiotensin Receptor Blockers Failed - 07/06/2022  2:11 AM      Failed - Cr in normal range and within 180 days    Creat  Date Value Ref Range Status  04/24/2022 1.02 (H) 0.50 - 0.99 mg/dL Final         Passed - K in normal range and within 180 days    Potassium  Date Value Ref Range Status  04/24/2022 4.1 3.5 - 5.3 mmol/L Final  03/03/2013 3.7 3.5 - 5.1 mmol/L Final         Passed - Patient is not pregnant      Passed - Last BP in normal range    BP Readings from Last 1 Encounters:  06/05/22 130/86         Passed - Valid encounter within last 6 months    Recent Outpatient Visits           2 months ago Hypertension, unspecified type   [place], DO   5 months ago Hypertension, unspecified type   [facility] Teodora Medici, DO   8 months ago Hypertension, unspecified type   [facility] Bo Merino, FNP   8 months ago Hypertension, unspecified type   [facility] Bo Merino, FNP   9 months ago Hypertension, unspecified type   [facility] Teodora Medici, DO       Future Appointments             In 3 months Teodora Medici, [facility] [place]   In 9 months Ralene Bathe, MD [place]

## 2022-07-22 ENCOUNTER — Other Ambulatory Visit: Payer: Self-pay | Admitting: Internal Medicine

## 2022-07-24 NOTE — Telephone Encounter (Signed)
Requested medication (s) are due for refill today: routing for review  Requested medication (s) are on the active medication list: yes  Last refill:  03/30/21  Future visit scheduled: yes  Notes to clinic:  Unable to refill per protocol, last refill by another provider.  Routing for approval.     Requested Prescriptions  Pending Prescriptions Disp Refills   levalbuterol (XOPENEX HFA) 45 MCG/ACT inhaler [Pharmacy Med Name: LEVALBUTEROL TAR HFA INH] 15 each 2    Sig: Inhale 2 puffs into the lungs every 8 (eight) hours as needed for wheezing or shortness of breath.     There is no refill protocol information for this order

## 2022-08-02 ENCOUNTER — Other Ambulatory Visit: Payer: Self-pay | Admitting: Internal Medicine

## 2022-08-02 DIAGNOSIS — I10 Essential (primary) hypertension: Secondary | ICD-10-CM

## 2022-08-02 NOTE — Telephone Encounter (Signed)
Requested Prescriptions  Pending Prescriptions Disp Refills   amLODipine (NORVASC) 10 MG tablet [Pharmacy Med Name: AMLODIPINE BESYLATE 10 MG TAB] 90 tablet 0    Sig: TAKE 1 TABLET BY MOUTH EVERY DAY     Cardiovascular: Calcium Channel Blockers 2 Passed - 08/02/2022  1:42 AM      Passed - Last BP in normal range    BP Readings from Last 1 Encounters:  06/05/22 130/86         Passed - Last Heart Rate in normal range    Pulse Readings from Last 1 Encounters:  06/05/22 (!) 102         Passed - Valid encounter within last 6 months    Recent Outpatient Visits           3 months ago Hypertension, unspecified type   [facility] Margarita Mail, DO   6 months ago Hypertension, unspecified type   [facility] Margarita Mail, DO   9 months ago Hypertension, unspecified type   [facility] Berniece Salines, FNP   9 months ago Hypertension, unspecified type   [facility] Berniece Salines, FNP   10 months ago Hypertension, unspecified type   [facility] Margarita Mail, DO       Future Appointments             In 2 months Margarita Mail, DO [facility]   In 8 months Deirdre Evener, MD [facility]

## 2022-08-22 ENCOUNTER — Ambulatory Visit (INDEPENDENT_AMBULATORY_CARE_PROVIDER_SITE_OTHER): Payer: 59

## 2022-08-22 VITALS — BP 139/87 | HR 102

## 2022-08-22 DIAGNOSIS — Z3042 Encounter for surveillance of injectable contraceptive: Secondary | ICD-10-CM

## 2022-08-22 MED ORDER — MEDROXYPROGESTERONE ACETATE 150 MG/ML IM SUSP
150.0000 mg | Freq: Once | INTRAMUSCULAR | Status: AC
  Administered 2022-08-22: 150 mg via INTRAMUSCULAR

## 2022-08-22 NOTE — Progress Notes (Signed)
Date last pap: 2018. Last Depo-Provera: 06/05/2022 . Side Effects if any: None. Serum HCG indicated? N/A . Depo-Provera 150 mg IM given by: Chriss Driver . Next appointment due 7/30-8/13.

## 2022-08-22 NOTE — Progress Notes (Signed)
Patient seen and assessed by nursing staff.  Agree with documentation and plan.  

## 2022-10-05 IMAGING — MG MM DIGITAL DIAGNOSTIC UNILAT*L* W/ TOMO W/ CAD
6 series · 6 of 18 positions shown · non-contrast
Comparison: Previous exams including recent screening mammogram
dated 05/11/2021.

CLINICAL DATA: Patient returns today to evaluate a possible LEFT
breast mass identified on recent screening mammogram.

EXAM:
DIGITAL DIAGNOSTIC UNILATERAL LEFT MAMMOGRAM WITH TOMOSYNTHESIS AND
CAD
TECHNIQUE: Left digital diagnostic mammography and breast tomosynthesis was
performed. The images were evaluated with computer-aided detection.

[L MLO synth-2D (1 of 2)]
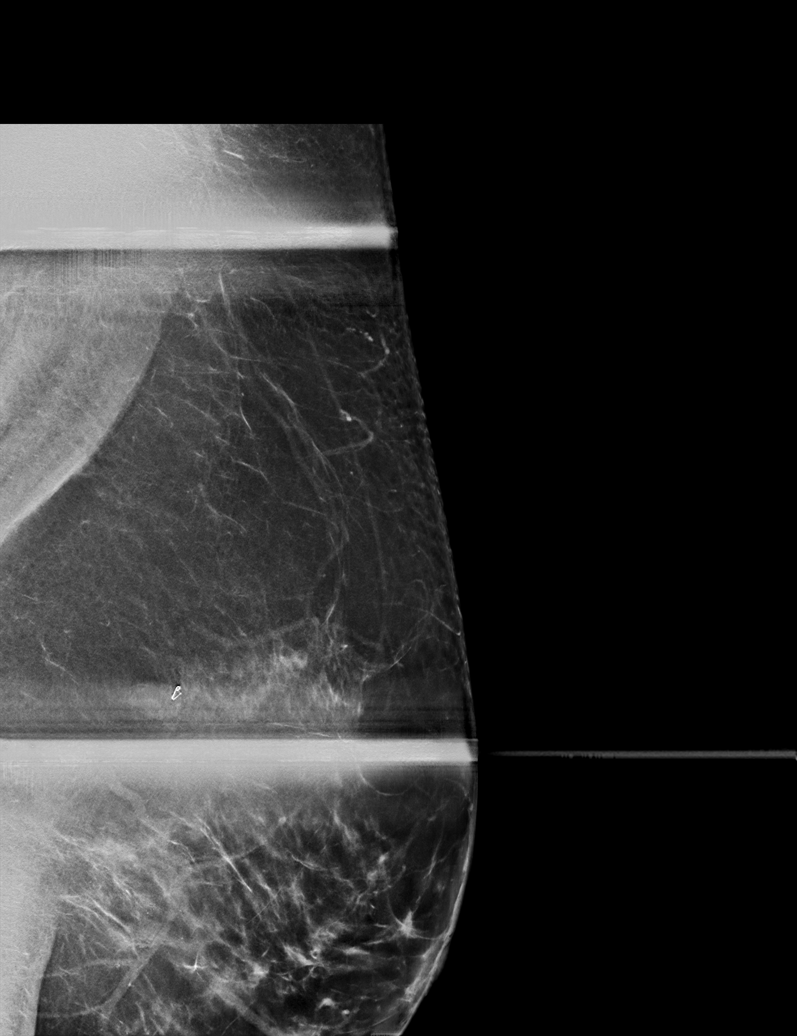

[L MLO synth-2D (2 of 2)]
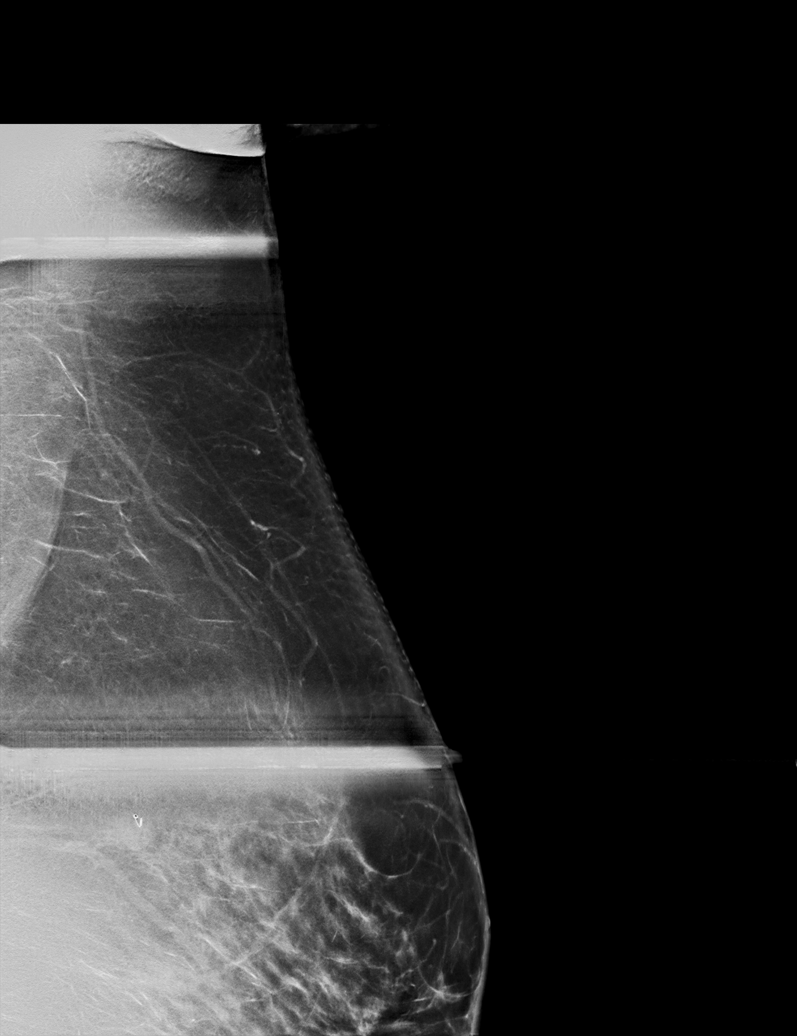

[L ML synth-2D]
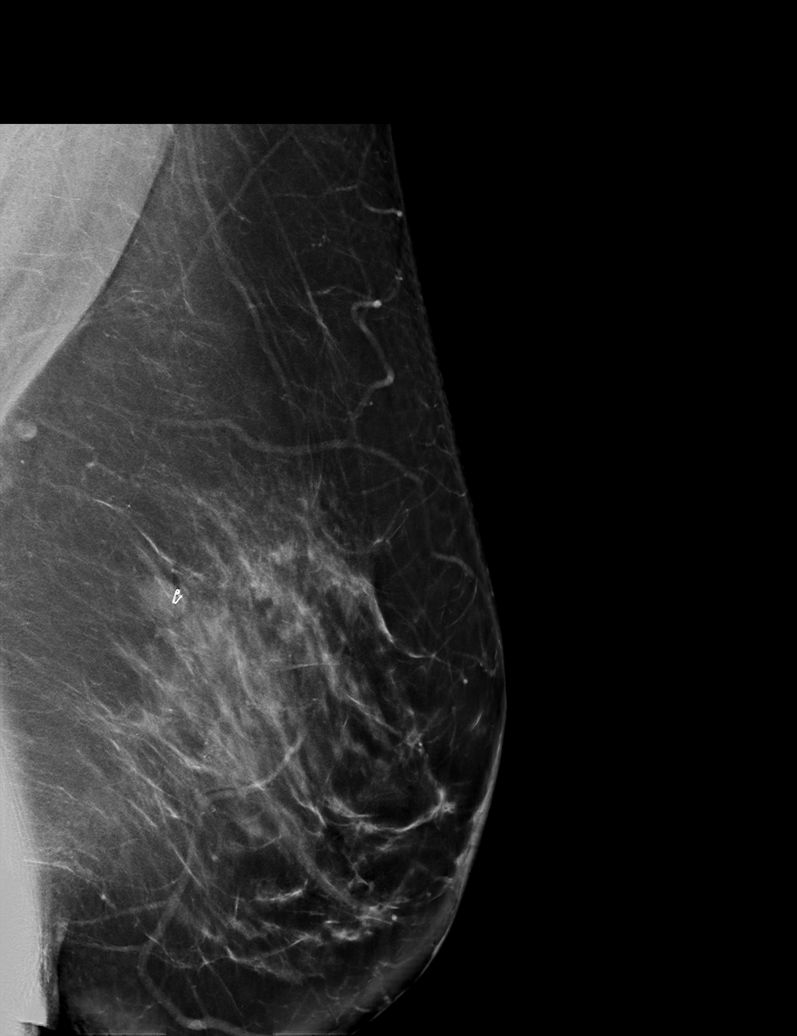

[L MLO tomo (1 of 2) · tomo slice 45/90.0]
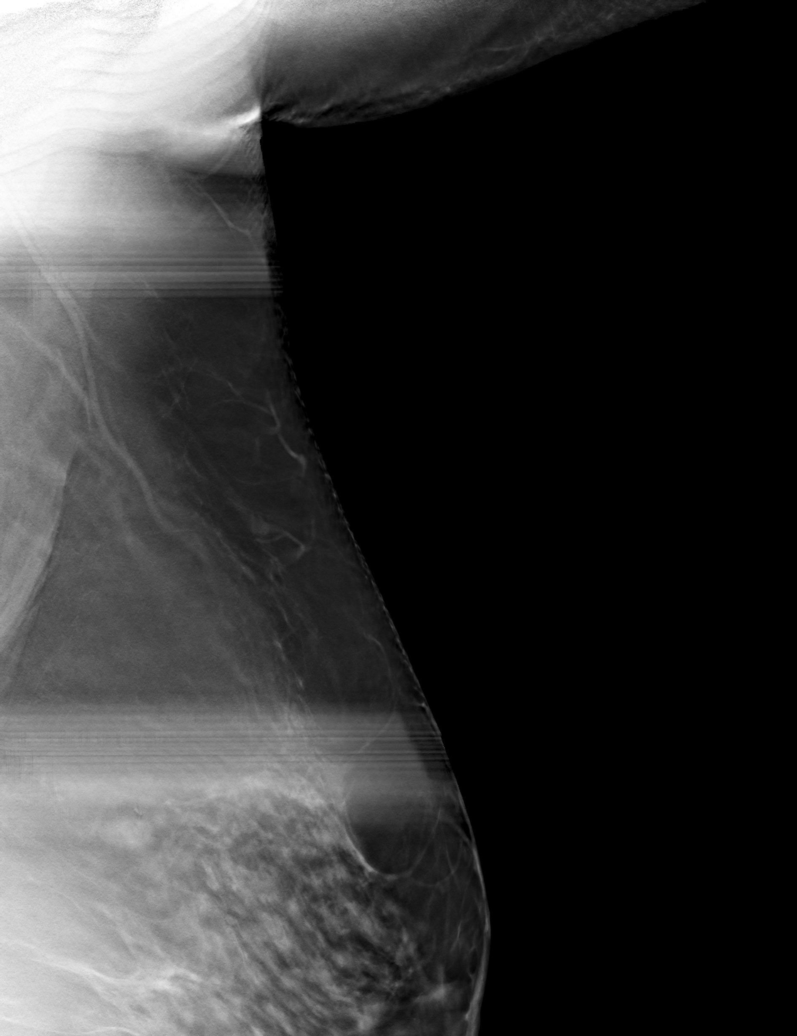

[L MLO tomo (2 of 2) · tomo slice 47/92.0]
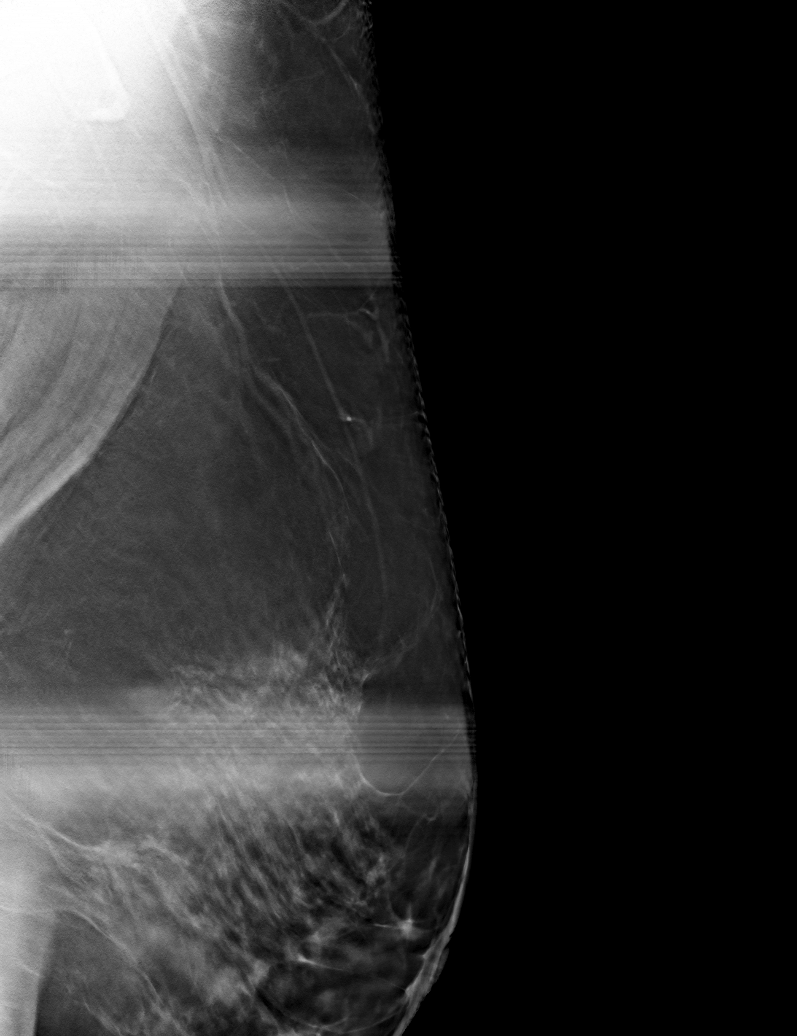

[L ML tomo · tomo slice 45/89.0]
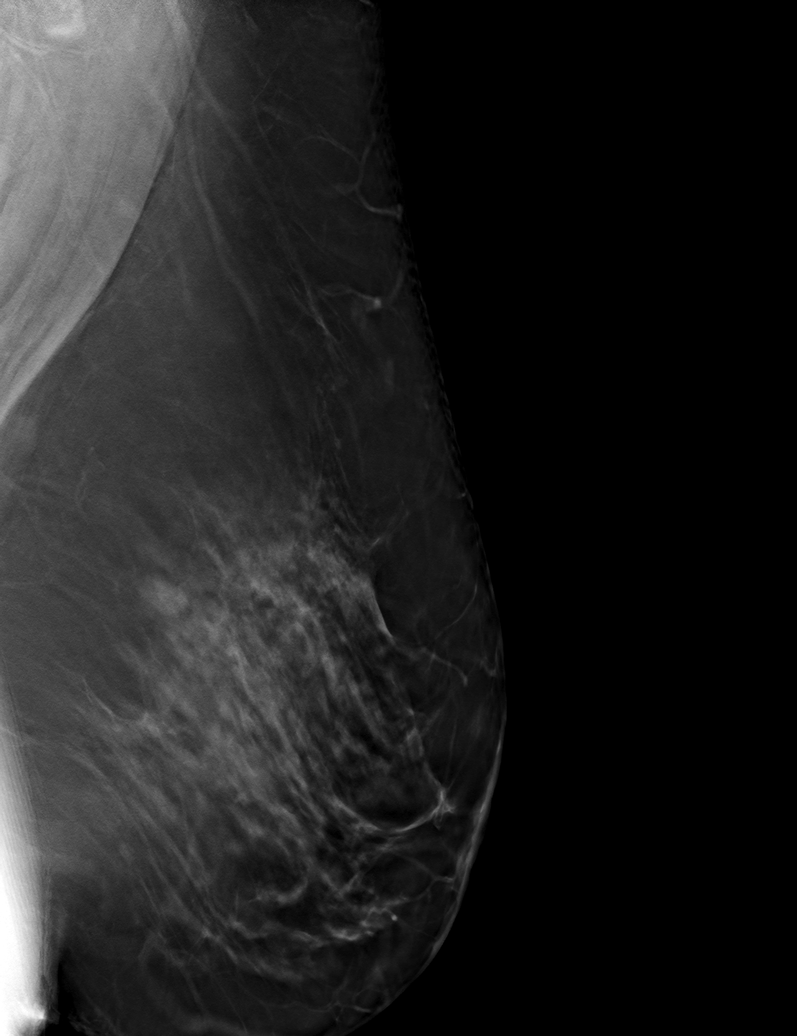

[6 of 18 positions shown; findings below may reference images not displayed]

ACR Breast Density Category b: There are scattered areas of
fibroglandular density.
FINDINGS: On today's additional diagnostic views, including spot compression
with 3D tomosynthesis, the questioned mass within the upper LEFT
breast, at far posterior depth, is consistent with a small benign
intramammary lymph node with reniform shape, circumscribed margins
and fatty hilum.

An additional benign mass is identified within the upper LEFT
breast, at middle depth, stable for greater than 2 years confirming
benignity, presumably an additional benign intramammary lymph.

Lastly, there is an additional mass containing a biopsy site marker
within the upper LEFT breast, at posterior depth, consistent with a
biopsy-proven fibroadenoma.

On today's exam, there is no suspicious mass, no suspicious
calcifications and no secondary signs of malignancy identified in
the LEFT breast.
IMPRESSION: No evidence of malignancy. Small benign intramammary lymph node
within the upper LEFT breast at posterior depth.

Patient may return to routine annual bilateral screening mammogram
schedule.

RECOMMENDATION:
Screening mammogram in one year.(Code:[id number])

I have discussed the findings and recommendations with the patient.
If applicable, a reminder letter will be sent to the patient
regarding the next appointment.

BI-RADS CATEGORY  2: Benign.

## 2022-10-22 NOTE — Progress Notes (Signed)
Established Patient Office Visit  Subjective:  Patient ID: Anita Weaver, female    DOB: [birth year]  Age: 47 y.o. MRN: [id number]  CC:  Chief Complaint  Patient presents with   Follow-up    HPI Anita Weaver presents for follow up.   Hypertension: -Medications: Amlodipine 10 mg, Losartan 50 mg  -Failed Meds: thought Lisinopril due to cough but ended up being PND -Patient is compliant with above medications and reports no side effects. -Checking BP at home (average): stable -Denies any SOB, vision changes, LE edema or symptoms of hypotension.  -Diagnosed with OSA 2/23 - cannot tolerate CPAP, not currently treating but feels like she is not getting good quality of air when she is laying flat  HLD: -Medications: Nothing currently  -Last lipid panel: Lipid Panel     Component Value Date/Time   CHOL 198 04/24/2022 0837   TRIG 113 04/24/2022 0837   HDL 41 (L) 04/24/2022 0837   CHOLHDL 4.8 04/24/2022 0837   VLDL 21 05/24/2015 0839   LDLCALC 135 (H) 04/24/2022 0837   The 10-year ASCVD risk score (Arnett DK, et al., 2019) is: 2%   Values used to calculate the score:     Age: 89 years     Sex: Female     Is Non-Hispanic African American: No     Diabetic: No     Tobacco smoker: No     Systolic Blood Pressure: 130 mmHg     Is BP treated: Yes     HDL Cholesterol: 41 mg/dL     Total Cholesterol: 198 mg/dL  Migraines: -Maxalt PRN, Nortriptyline -Following with Neurology, last seen 10/03/22  Asthma/Allergies: -Currently on Albuterol PRN and Flonase  GERD: -Has an appointment with GI in August, having break through acid reflux symptoms -Currently on Protonix 40 mg in the morning and taking Prilosec for break through symptoms   Health Maintenance: -Blood work UTD -Pap in 1/23 -Mammogram 3/23 Birads-2 -Colonoscopy 12/23 - repeat 10 years   Past Medical History:  Diagnosis Date   Allergy    Arthritis    Asthma    Chronic pelvic pain in female    Endometriosis    FH:  breast cancer in first degree relative    Mother and sister both are BRCA positive; patient is BRCA negative   GERD (gastroesophageal reflux disease)    Headache    migraines   Heart murmur    Hyperlipidemia    Hypertension    Interstitial cystitis    Irritable bowel disease    Neck pain     Past Surgical History:  Procedure Laterality Date   BREAST BIOPSY Left 02/08/2017   CHOLECYSTECTOMY     COLONOSCOPY WITH PROPOFOL N/A 04/06/2022   Procedure: COLONOSCOPY WITH PROPOFOL;  Surgeon: Midge Minium, MD;  Location: [facility] ENDOSCOPY;  Service: Endoscopy;  Laterality: N/A;   LAPAROSCOPY  2006   TUBAL LIGATION      Family History  Problem Relation Age of Onset   Cancer Mother 36       breast   Breast cancer Mother 67   Cancer Sister 58   Breast cancer Sister 39   Kidney disease Son    Heart attack Maternal Grandfather    Heart attack Paternal Grandmother    Heart attack Paternal Grandfather     Social History   Socioeconomic History   Marital status: Married    Spouse name: Jonny Ruiz   Number of children: 3   Years of education: 63  Highest education level: Not on file  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  Tobacco Use   Smoking status: Never   Smokeless tobacco: Never  Vaping Use   Vaping status: Never Used  Substance and Sexual Activity   Alcohol use: Yes    Alcohol/week: 0.0 standard drinks of alcohol    Comment: occasionally   Drug use: No   Sexual activity: Yes    Partners: Male    Birth control/protection: Surgical    Comment: tubaligation  Other Topics Concern   Not on file  Social History Narrative   ** Merged History Encounter **       ** Data from: 02/09/14 Enc Dept: [facility]       ** Data from: 11/10/13 Enc Dept: [facility]   Patient lives at home with her husband Jonny Ruiz)   Patient works full time.   Right handed.   Caffeine- none   Education- high school   Social Determinants of Health   Financial Resource Strain: Not on  file  Food Insecurity: Not on file  Transportation Needs: Not on file  Physical Activity: Not on file  Stress: Not on file  Social Connections: Not on file  Intimate Partner Violence: Not on file    Outpatient Medications Prior to Visit  Medication Sig Dispense Refill   amLODipine (NORVASC) 10 MG tablet TAKE 1 TABLET BY MOUTH EVERY DAY 90 tablet 0   cetirizine (ZYRTEC) 10 MG chewable tablet Chew 1 tablet (10 mg total) by mouth daily. 30 tablet 3   hydrOXYzine (ATARAX) 10 MG tablet Take 1 tablet (10 mg total) by mouth 3 (three) times daily as needed. 30 tablet 2   levalbuterol (XOPENEX HFA) 45 MCG/ACT inhaler INHALE 2 PUFFS INTO THE LUNGS EVERY 8 (EIGHT) HOURS AS NEEDED FOR WHEEZING OR SHORTNESS OF BREATH. 15 each 2   losartan (COZAAR) 50 MG tablet TAKE 1 TABLET BY MOUTH EVERY DAY 90 tablet 1   lurasidone (LATUDA) 40 MG TABS tablet TAKE 1 TABLET BY MOUTH DAILY WITH BREAKFAST. 90 tablet 3   medroxyPROGESTERone (DEPO-PROVERA) 150 MG/ML injection Inject 1 mL (150 mg total) into the muscle every 3 (three) months. 1 mL 3   medroxyPROGESTERone (DEPO-PROVERA) 150 MG/ML injection Inject 1 mL (150 mg total) into the muscle every 3 (three) months. 1 mL 0   nortriptyline (PAMELOR) 50 MG capsule Take 50 mg by mouth at bedtime.     ondansetron (ZOFRAN) 4 MG tablet Take one tablet every 8 hours as needed. #10 to last 30 days. For migraines. 20 tablet 1   pantoprazole (PROTONIX) 40 MG tablet Take 1 tablet (40 mg total) by mouth daily. 30 tablet 11   rizatriptan (MAXALT) 10 MG tablet Take 1 tablet (10 mg total) by mouth as needed for migraine. May repeat in 2 hours if needed 10 tablet 3   fluticasone (FLONASE) 50 MCG/ACT nasal spray Place 2 sprays into both nostrils daily. 16 g 6   medroxyPROGESTERone (DEPO-PROVERA) injection 150 mg      No facility-administered medications prior to visit.    Allergies  Allergen Reactions   Imitrex [Sumatriptan] Other (See Comments)    Heart palpitations, hot/cold  sweats   Demerol [Meperidine] Other (See Comments)    hallucinations   Flagyl [Metronidazole] Hives   Motrin [Ibuprofen] Other (See Comments)    swelling    ROS Review of Systems  Constitutional:  Positive for fatigue. Negative for chills and fever.  HENT:  Negative for congestion.  Eyes:  Negative for visual disturbance.  Respiratory:  Negative for cough and shortness of breath.   Cardiovascular:  Negative for leg swelling.  Gastrointestinal:  Positive for abdominal pain, constipation and nausea.  Neurological:  Negative for dizziness.      Objective:    Physical Exam Constitutional:      Appearance: Normal appearance.  HENT:     Head: Normocephalic and atraumatic.  Eyes:     Conjunctiva/sclera: Conjunctivae normal.  Cardiovascular:     Rate and Rhythm: Normal rate and regular rhythm.  Pulmonary:     Effort: Pulmonary effort is normal.     Breath sounds: Normal breath sounds.  Musculoskeletal:     Right lower leg: No edema.     Left lower leg: No edema.  Skin:    General: Skin is warm and dry.  Neurological:     General: No focal deficit present.     Mental Status: She is alert. Mental status is at baseline.  Psychiatric:        Mood and Affect: Mood normal.        Behavior: Behavior normal.     BP 130/82   Pulse 95   Temp 98.2 F (36.8 C)   Resp 16   Ht 5\' 8"  (1.727 m)   Wt 218 lb 11.2 oz (99.2 kg)   SpO2 98%   BMI 33.25 kg/m  Wt Readings from Last 3 Encounters:  10/23/22 218 lb 11.2 oz (99.2 kg)  04/24/22 211 lb 8 oz (95.9 kg)  04/06/22 207 lb 6.4 oz (94.1 kg)     Health Maintenance Due  Topic Date Due   PAP SMEAR-Modifier  12/06/2019    There are no preventive care reminders to display for this patient.  Lab Results  Component Value Date   TSH 1.87 04/24/2022   Lab Results  Component Value Date   WBC 9.1 04/24/2022   HGB 16.3 (H) 04/24/2022   HCT 46.8 (H) 04/24/2022   MCV 86.7 04/24/2022   PLT 425 (H) 04/24/2022   Lab Results   Component Value Date   NA 138 04/24/2022   K 4.1 04/24/2022   CO2 27 04/24/2022   GLUCOSE 91 04/24/2022   BUN 10 04/24/2022   CREATININE 1.02 (H) 04/24/2022   BILITOT 0.5 06/24/2021   ALKPHOS 41 (L) 03/03/2013   AST 15 06/24/2021   ALT 21 06/24/2021   PROT 7.0 06/24/2021   ALBUMIN 3.8 03/03/2013   CALCIUM 9.6 04/24/2022   ANIONGAP 6 (L) 03/03/2013   EGFR 71 06/24/2021   Lab Results  Component Value Date   CHOL 198 04/24/2022   Lab Results  Component Value Date   HDL 41 (L) 04/24/2022   Lab Results  Component Value Date   LDLCALC 135 (H) 04/24/2022   Lab Results  Component Value Date   TRIG 113 04/24/2022   Lab Results  Component Value Date   CHOLHDL 4.8 04/24/2022   Lab Results  Component Value Date   HGBA1C 4.6 01/23/2019      Assessment & Plan:   1. Hypertension, unspecified type: Well controlled, continue Amlodipine 10 mg and Losartan 50 mg, refilled.  - amLODipine (NORVASC) 10 MG tablet; Take 1 tablet (10 mg total) by mouth daily.  Dispense: 90 tablet; Refill: 1 - losartan (COZAAR) 50 MG tablet; Take 1 tablet (50 mg total) by mouth daily.  Dispense: 90 tablet; Refill: 1  2. Gastroesophageal reflux disease, unspecified whether esophagitis present: Recommend PPI in the morning and something  like Pepcid for break through symptoms, has appointment with GI in August.   3. Fatigue, unspecified type: OSA not being treated, recommend discussing with Pulmonology other treatment options besides CPAP for OSA.  4. Weight gain: Discussed weight loss medications, unfortunately this will not be covered for her. Gained weight due to the Depo which she has to take for the endometriosis, no other options.    Follow-up: Return in about 6 months (around 04/25/2023).    Margarita Mail, DO

## 2022-10-23 ENCOUNTER — Encounter: Payer: Self-pay | Admitting: Internal Medicine

## 2022-10-23 ENCOUNTER — Ambulatory Visit: Payer: 59 | Admitting: Internal Medicine

## 2022-10-23 VITALS — BP 130/82 | HR 95 | Temp 98.2°F | Resp 16 | Ht 68.0 in | Wt 218.7 lb

## 2022-10-23 DIAGNOSIS — I10 Essential (primary) hypertension: Secondary | ICD-10-CM | POA: Diagnosis not present

## 2022-10-23 DIAGNOSIS — R635 Abnormal weight gain: Secondary | ICD-10-CM

## 2022-10-23 DIAGNOSIS — K219 Gastro-esophageal reflux disease without esophagitis: Secondary | ICD-10-CM | POA: Diagnosis not present

## 2022-10-23 DIAGNOSIS — R5383 Other fatigue: Secondary | ICD-10-CM | POA: Diagnosis not present

## 2022-10-23 MED ORDER — LOSARTAN POTASSIUM 50 MG PO TABS
50.0000 mg | ORAL_TABLET | Freq: Every day | ORAL | 1 refills | Status: DC
Start: 2022-10-23 — End: 2023-04-23

## 2022-10-23 MED ORDER — AMLODIPINE BESYLATE 10 MG PO TABS
10.0000 mg | ORAL_TABLET | Freq: Every day | ORAL | 1 refills | Status: DC
Start: 2022-10-23 — End: 2023-04-23

## 2022-11-09 ENCOUNTER — Ambulatory Visit (INDEPENDENT_AMBULATORY_CARE_PROVIDER_SITE_OTHER): Payer: 59

## 2022-11-09 VITALS — BP 125/83

## 2022-11-09 DIAGNOSIS — Z3042 Encounter for surveillance of injectable contraceptive: Secondary | ICD-10-CM

## 2022-11-09 MED ORDER — MEDROXYPROGESTERONE ACETATE 150 MG/ML IM SUSP
150.0000 mg | Freq: Once | INTRAMUSCULAR | Status: AC
Start: 2022-11-09 — End: 2022-11-09
  Administered 2022-11-09: 150 mg via INTRAMUSCULAR

## 2022-11-09 NOTE — Progress Notes (Signed)
Date last pap: Needing to schedule. Last Depo-Provera: 08/22/22. Side Effects if any: NA. Serum HCG indicated? NA. Depo-Provera 150 mg IM given by: Chriss Driver . Next appointment due 10/17-10/31.    Pt supplied

## 2022-11-23 ENCOUNTER — Ambulatory Visit: Payer: 59 | Admitting: Gastroenterology

## 2022-11-23 ENCOUNTER — Encounter: Payer: Self-pay | Admitting: Gastroenterology

## 2022-11-23 VITALS — BP 121/82 | HR 111 | Temp 98.1°F | Wt 220.0 lb

## 2022-11-23 DIAGNOSIS — K219 Gastro-esophageal reflux disease without esophagitis: Secondary | ICD-10-CM | POA: Diagnosis not present

## 2022-11-23 DIAGNOSIS — K5900 Constipation, unspecified: Secondary | ICD-10-CM | POA: Diagnosis not present

## 2022-11-23 MED ORDER — LINACLOTIDE 145 MCG PO CAPS: 145.0000 ug | ORAL_CAPSULE | Freq: Every day | ORAL | Status: DC

## 2022-11-23 MED ORDER — LINACLOTIDE 72 MCG PO CAPS: 72.0000 ug | ORAL_CAPSULE | Freq: Every day | ORAL | Status: DC

## 2022-11-23 NOTE — Progress Notes (Signed)
Primary Care Physician: Margarita Mail, DO  Primary Gastroenterologist:  Dr. Midge Minium  Chief Complaint  Patient presents with   Follow-up    HPI: Anita Weaver is a 47 y.o. female here for follow-up after having a colonoscopy by me last year that was normal and a repeat was recommended in 10 years.  The patient has a history of GERD and in the past has been well-controlled with Protonix.  The patient is now here for follow-up. The patient reports that she has been having a lot more GERD.  She states that she has gained some weight but now she was taking the pantoprazole once a day and a few times a week would take famotidine because of the acid breakthrough.  She states that the famotidine works when she takes it.  There is no report of any dysphagia black stools or bloody stools.  She is up-to-date with her colonoscopy. The patient also reports that her constipation has been bothering her recently and is wanting to know if there is something she can do about the constipation.  Past Medical History:  Diagnosis Date   Allergy    Arthritis    Asthma    Chronic pelvic pain in female    Endometriosis    FH: breast cancer in first degree relative    Mother and sister both are BRCA positive; patient is BRCA negative   GERD (gastroesophageal reflux disease)    Headache    migraines   Heart murmur    Hyperlipidemia    Hypertension    Interstitial cystitis    Irritable bowel disease    Neck pain     Current Outpatient Medications  Medication Sig Dispense Refill   amLODipine (NORVASC) 10 MG tablet Take 1 tablet (10 mg total) by mouth daily. 90 tablet 1   levalbuterol (XOPENEX HFA) 45 MCG/ACT inhaler INHALE 2 PUFFS INTO THE LUNGS EVERY 8 (EIGHT) HOURS AS NEEDED FOR WHEEZING OR SHORTNESS OF BREATH. 15 each 2   linaclotide (LINZESS) 145 MCG CAPS capsule Take 1 capsule (145 mcg total) by mouth daily before breakfast.     linaclotide (LINZESS) 72 MCG capsule Take 1 capsule (72 mcg  total) by mouth daily before breakfast.     losartan (COZAAR) 50 MG tablet Take 1 tablet (50 mg total) by mouth daily. 90 tablet 1   lurasidone (LATUDA) 40 MG TABS tablet TAKE 1 TABLET BY MOUTH DAILY WITH BREAKFAST. 90 tablet 3   medroxyPROGESTERone (DEPO-PROVERA) 150 MG/ML injection Inject 1 mL (150 mg total) into the muscle every 3 (three) months. 1 mL 0   nortriptyline (PAMELOR) 50 MG capsule Take 50 mg by mouth at bedtime.     ondansetron (ZOFRAN) 4 MG tablet Take one tablet every 8 hours as needed. #10 to last 30 days. For migraines. 20 tablet 1   pantoprazole (PROTONIX) 40 MG tablet Take 1 tablet (40 mg total) by mouth daily. 30 tablet 11   rizatriptan (MAXALT) 10 MG tablet Take 1 tablet (10 mg total) by mouth as needed for migraine. May repeat in 2 hours if needed 10 tablet 3   No current facility-administered medications for this visit.    Allergies as of 11/23/2022 - Review Complete 11/23/2022  Allergen Reaction Noted   Imitrex [sumatriptan] Other (See Comments) 04/24/2014   Demerol [meperidine] Other (See Comments) 04/18/2012   Flagyl [metronidazole] Hives 04/15/2015   Motrin [ibuprofen] Other (See Comments) 04/06/2022    ROS:  General: Negative for anorexia, weight loss,  fever, chills, fatigue, weakness. ENT: Negative for hoarseness, difficulty swallowing , nasal congestion. CV: Negative for chest pain, angina, palpitations, dyspnea on exertion, peripheral edema.  Respiratory: Negative for dyspnea at rest, dyspnea on exertion, cough, sputum, wheezing.  GI: See history of present illness. GU:  Negative for dysuria, hematuria, urinary incontinence, urinary frequency, nocturnal urination.  Endo: Negative for unusual weight change.    Physical Examination:   BP 121/82 (BP Location: Right Arm, Patient Position: Sitting, Cuff Size: Large)   Pulse (!) 111   Temp 98.1 F (36.7 C) (Oral)   Wt 220 lb (99.8 kg)   BMI 33.45 kg/m   General: Well-nourished, well-developed in no  acute distress.  Eyes: No icterus. Conjunctivae pink. Neuro: Alert and oriented x 3.  Grossly intact. Skin: Warm and dry, no jaundice.   Psych: Alert and cooperative, normal mood and affect.  Labs:    Imaging Studies: No results found.  Assessment and Plan:   [name] is a 47 y.o. y/o female who comes in today with a history of GERD with her symptoms being worse since she had her gallbladder out.  The patient has been doing well with a dose of a H2 blocker at night.  The patient has been told that she can take the H2 blocker every night to avoid having the breakthrough symptoms.  The patient has also been told that if this does not work we can try switching her to another PPI versus doubling up on her present PPI.  The patient has also been encouraged to lose weight which may help with her acid reflux.  The patient has also been given instructions on the dietary modifications she can try including not eating for 3 to 4 hours before laying down in addition to avoiding carbonated and caffeinated beverages.   Due to the patient's persistent constipation the patient will be given samples of Linzess 72 mcg and 145 mcg.  The patient has been told to try this and if it does not work we can go up to the 290 mcg.  If 1 of these doses to work she has been instructed to contact me and we will send in a prescription for this medication.  The patient has been explained the plan and agrees with it.     Midge Minium, MD. Clementeen Graham    Note: This dictation was prepared with Dragon dictation along with smaller phrase technology. Any transcriptional errors that result from this process are unintentional.

## 2022-12-01 ENCOUNTER — Other Ambulatory Visit: Payer: Self-pay | Admitting: Gastroenterology

## 2022-12-01 DIAGNOSIS — R0600 Dyspnea, unspecified: Secondary | ICD-10-CM

## 2022-12-04 NOTE — Progress Notes (Unsigned)
   Acute Office Visit  Subjective:     Patient ID: TALEIGHA DIEBEL, female    DOB: 1976-01-18, 47 y.o.   MRN: [id number]  No chief complaint on file.   HPI Patient is in today for cough and congestion.   URI Compliant:   -Worst symptom: -Fever: {Blank single:19197::"yes","no"} -Cough: {Blank single:19197::"yes","no"} -Shortness of breath: {Blank single:19197::"yes","no"} -Wheezing: {Blank single:19197::"yes","no"} -Chest pain: {Blank single:19197::"yes","no","yes, with cough"} -Chest tightness: {Blank single:19197::"yes","no"} -Chest congestion: {Blank single:19197::"yes","no"} -Nasal congestion: {Blank single:19197::"yes","no"} -Runny nose: {Blank single:19197::"yes","no"} -Post nasal drip: {Blank single:19197::"yes","no"} -Sneezing: {Blank single:19197::"yes","no"} -Sore throat: {Blank single:19197::"yes","no"} -Swollen glands: {Blank single:19197::"yes","no"} -Sinus pressure: {Blank single:19197::"yes","no"} -Headache: {Blank single:19197::"yes","no"} -Face pain: {Blank single:19197::"yes","no"} -Toothache: {Blank single:19197::"yes","no"} -Ear pain: {Blank single:19197::"yes","no"} {Blank single:19197::""right","left", "bilateral"} -Ear pressure: {Blank single:19197::"yes","no"} {Blank single:19197::""right","left", "bilateral"} -Eyes red/itching:{Blank single:19197::"yes","no"} -Eye drainage/crusting: {Blank single:19197::"yes","no"}  -Vomiting: {Blank single:19197::"yes","no"} -Rash: {Blank single:19197::"yes","no"} -Fatigue: {Blank single:19197::"yes","no"} -Sick contacts: {Blank single:19197::"yes","no"} -Strep contacts: {Blank single:19197::"yes","no"}  -Context: {Blank multiple:19196::"better","worse","stable","fluctuating"} -Recurrent sinusitis: {Blank single:19197::"yes","no"} -Relief with OTC cold/cough medications: {Blank single:19197::"yes","no"}  -Treatments attempted: {Blank  multiple:19196::"none","cold/sinus","mucinex","anti-histamine","pseudoephedrine","cough syrup","antibiotics"}    ROS      Objective:    There were no vitals taken for this visit. {Vitals History (Optional):23777}  Physical Exam  No results found for any visits on 12/05/22.      Assessment & Plan:   Problem List Items Addressed This Visit   None   No orders of the defined types were placed in this encounter.   No follow-ups on file.  Margarita Mail, DO

## 2022-12-05 ENCOUNTER — Ambulatory Visit: Payer: 59 | Admitting: Internal Medicine

## 2022-12-05 ENCOUNTER — Encounter: Payer: Self-pay | Admitting: Internal Medicine

## 2022-12-05 VITALS — BP 120/80 | HR 113 | Temp 97.6°F | Ht 68.0 in | Wt 218.3 lb

## 2022-12-05 DIAGNOSIS — J069 Acute upper respiratory infection, unspecified: Secondary | ICD-10-CM

## 2022-12-05 DIAGNOSIS — R051 Acute cough: Secondary | ICD-10-CM | POA: Diagnosis not present

## 2022-12-05 MED ORDER — BENZONATATE 100 MG PO CAPS
100.0000 mg | ORAL_CAPSULE | Freq: Two times a day (BID) | ORAL | 0 refills | Status: DC | PRN
Start: 2022-12-05 — End: 2023-05-21

## 2022-12-05 MED ORDER — FLUTICASONE PROPIONATE 50 MCG/ACT NA SUSP: 2.0000 | Freq: Every day | NASAL | 6 refills | Status: DC

## 2022-12-07 LAB — NOVEL CORONAVIRUS, NAA: SARS-CoV-2, NAA: NOT DETECTED

## 2022-12-15 ENCOUNTER — Ambulatory Visit: Payer: 59 | Admitting: Adult Health

## 2022-12-15 ENCOUNTER — Encounter: Payer: Self-pay | Admitting: Adult Health

## 2022-12-15 DIAGNOSIS — F3130 Bipolar disorder, current episode depressed, mild or moderate severity, unspecified: Secondary | ICD-10-CM | POA: Diagnosis not present

## 2022-12-15 NOTE — Progress Notes (Signed)
Anita Weaver [id number] 1975-12-14 47 y.o.  Subjective:   Patient ID:  Anita Weaver is a 47 y.o. (DOB 02/27/1976) female.  Chief Complaint: No chief complaint on file.   HPI Anita Weaver presents to the office today for follow-up of BPD 1, anxiety and insomnia.  Accompanied by husband  Describes mood today as "ok". Pleasant. Reports some tearfulness. Mood symptoms - denies depression, anxiety, and irritability. Denies panic attacks. Denies worry, rumination, and over thinking. Mood is consistent. Stating "I feel like I'm doing alright". Feels like the Latuda at 40mg  daily continues to work well for her. She and family doing well. Stable interest and motivation. Taking medications as prescribed.  Energy levels lower. Active, does not have a regular exercise routine.  Enjoys some usual interests and activities. Married. Lives with husband. Has 3 grown sons - 3 grandchildren - sons. Parents live in [place]. Appetite adequate. Weight gain - on a diet - 5\' 8" . Sleeps well most nights. Averages 8 or more hours. Focus and concentration difficulties. Completing tasks. Managing aspects of household. Works full time - preschool. Denies SI or HI.  Denies AH or VH. Denies self harm. Denies substance use.   PHQ2-9    Flowsheet Row Office Visit from 12/05/2022 in [facility] Office Visit from 10/23/2022 in [facility] Office Visit from 04/24/2022 in [facility] Office Visit from 01/12/2022 in [facility] Office Visit from 11/02/2021 in [facility]  PHQ-2 Total Score 0 0 0 0 0  PHQ-9 Total Score 0 0 -- 0 0      Flowsheet Row Admission (Discharged) from 04/06/2022 in [facility]  C-SSRS RISK CATEGORY No Risk        Review of Systems:  Review of Systems  Musculoskeletal:  Negative for gait problem.  Neurological:  Negative for  tremors.  Psychiatric/Behavioral:         Please refer to HPI    Medications: I have reviewed the patient's current medications.  Current Outpatient Medications  Medication Sig Dispense Refill   amLODipine (NORVASC) 10 MG tablet Take 1 tablet (10 mg total) by mouth daily. 90 tablet 1   benzonatate (TESSALON) 100 MG capsule Take 1 capsule (100 mg total) by mouth 2 (two) times daily as needed for cough. 20 capsule 0   cholecalciferol (VITAMIN D3) 25 MCG (1000 UNIT) tablet Take 1,000 Units by mouth daily.     famotidine (PEPCID) 40 MG tablet Take 40 mg by mouth daily.     fluticasone (FLONASE) 50 MCG/ACT nasal spray Place 2 sprays into both nostrils daily. 16 g 6   levalbuterol (XOPENEX HFA) 45 MCG/ACT inhaler INHALE 2 PUFFS INTO THE LUNGS EVERY 8 (EIGHT) HOURS AS NEEDED FOR WHEEZING OR SHORTNESS OF BREATH. 15 each 2   linaclotide (LINZESS) 145 MCG CAPS capsule Take 1 capsule (145 mcg total) by mouth daily before breakfast. (Patient not taking: Reported on 12/05/2022)     linaclotide (LINZESS) 72 MCG capsule Take 1 capsule (72 mcg total) by mouth daily before breakfast. (Patient not taking: Reported on 12/05/2022)     losartan (COZAAR) 50 MG tablet Take 1 tablet (50 mg total) by mouth daily. 90 tablet 1   lurasidone (LATUDA) 40 MG TABS tablet TAKE 1 TABLET BY MOUTH DAILY WITH BREAKFAST. 90 tablet 3   medroxyPROGESTERone (DEPO-PROVERA) 150 MG/ML injection Inject 1 mL (150 mg total) into the muscle every 3 (  three) months. 1 mL 0   nortriptyline (PAMELOR) 50 MG capsule Take 50 mg by mouth at bedtime.     ondansetron (ZOFRAN) 4 MG tablet Take one tablet every 8 hours as needed. #10 to last 30 days. For migraines. 20 tablet 1   pantoprazole (PROTONIX) 40 MG tablet TAKE 1 TABLET BY MOUTH EVERY DAY 30 tablet 11   rizatriptan (MAXALT) 10 MG tablet Take 1 tablet (10 mg total) by mouth as needed for migraine. May repeat in 2 hours if needed 10 tablet 3   No current facility-administered medications for  this visit.    Medication Side Effects: None  Allergies:  Allergies  Allergen Reactions   Imitrex [Sumatriptan] Other (See Comments)    Heart palpitations, hot/cold sweats   Demerol [Meperidine] Other (See Comments)    hallucinations   Flagyl [Metronidazole] Hives   Motrin [Ibuprofen] Other (See Comments)    swelling    Past Medical History:  Diagnosis Date   Allergy    Arthritis    Asthma    Chronic pelvic pain in female    Endometriosis    FH: breast cancer in first degree relative    Mother and sister both are BRCA positive; patient is BRCA negative   GERD (gastroesophageal reflux disease)    Headache    migraines   Heart murmur    Hyperlipidemia    Hypertension    Interstitial cystitis    Irritable bowel disease    Neck pain     Past Medical History, Surgical history, Social history, and Family history were reviewed and updated as appropriate.   Please see review of systems for further details on the patient's review from today.   Objective:   Physical Exam:  There were no vitals taken for this visit.  Physical Exam  Lab Review:     Component Value Date/Time   NA 138 04/24/2022 0837   NA 139 03/03/2013 1545   K 4.1 04/24/2022 0837   K 3.7 03/03/2013 1545   CL 103 04/24/2022 0837   CL 105 03/03/2013 1545   CO2 27 04/24/2022 0837   CO2 28 03/03/2013 1545   GLUCOSE 91 04/24/2022 0837   GLUCOSE 106 (H) 03/03/2013 1545   BUN 10 04/24/2022 0837   BUN 11 03/03/2013 1545   CREATININE 1.02 (H) 04/24/2022 0837   CALCIUM 9.6 04/24/2022 0837   CALCIUM 8.8 03/03/2013 1545   PROT 7.0 06/24/2021 1519   PROT 7.3 03/03/2013 1545   ALBUMIN 3.8 03/03/2013 1545   AST 15 06/24/2021 1519   AST 8 (L) 03/03/2013 1545   ALT 21 06/24/2021 1519   ALT 15 03/03/2013 1545   ALKPHOS 41 (L) 03/03/2013 1545   BILITOT 0.5 06/24/2021 1519   BILITOT 0.4 03/03/2013 1545   GFRNONAA 79 05/14/2020 1532   GFRAA 91 05/14/2020 1532       Component Value Date/Time   WBC 9.1  04/24/2022 0837   RBC 5.40 (H) 04/24/2022 0837   HGB 16.3 (H) 04/24/2022 0837   HGB 13.8 03/03/2013 1545   HCT 46.8 (H) 04/24/2022 0837   HCT 40.2 03/03/2013 1545   PLT 425 (H) 04/24/2022 0837   PLT 288 03/03/2013 1545   MCV 86.7 04/24/2022 0837   MCV 85 03/03/2013 1545   MCH 30.2 04/24/2022 0837   MCHC 34.8 04/24/2022 0837   RDW 12.5 04/24/2022 0837   RDW 13.3 03/03/2013 1545   LYMPHSABS 3,039 04/24/2022 0837   LYMPHSABS 2.9 03/03/2013 1545   MONOABS 752  01/20/2016 1603   MONOABS 0.6 03/03/2013 1545   EOSABS 428 04/24/2022 0837   EOSABS 0.2 03/03/2013 1545   BASOSABS 64 04/24/2022 0837   BASOSABS 0.0 03/03/2013 1545    No results found for: "POCLITH", "LITHIUM"   No results found for: "PHENYTOIN", "PHENOBARB", "VALPROATE", "CBMZ"   .res Assessment: Plan:    Plan:  1. Latuda 40mg  daily   RTC 6 months  Patient advised to contact office with any questions, adverse effects, or acute worsening in signs and symptoms.  Time spent with patient was 15 minutes. Greater than 50% of face to face time with patient was spent on counseling and coordination of care.    Discussed potential metabolic side effects associated with atypical antipsychotics, as well as potential risk for movement side effects. Advised pt to contact office if movement side effects occur.  There are no diagnoses linked to this encounter.   Please see After Visit Summary for patient specific instructions.  Future Appointments  Date Time Provider Department Center  04/19/2023  3:00 PM Deirdre Evener, MD [facility] None  04/30/2023  8:40 AM Margarita Mail, DO [facility]    No orders of the defined types were placed in this encounter.   -------------------------------

## 2023-01-29 ENCOUNTER — Ambulatory Visit: Payer: 59

## 2023-01-29 VITALS — BP 131/87 | HR 112

## 2023-01-29 DIAGNOSIS — Z3042 Encounter for surveillance of injectable contraceptive: Secondary | ICD-10-CM | POA: Diagnosis not present

## 2023-01-29 MED ORDER — MEDROXYPROGESTERONE ACETATE 150 MG/ML IM SUSY
150.0000 mg | PREFILLED_SYRINGE | Freq: Once | INTRAMUSCULAR | Status: AC
  Administered 2023-01-29: 150 mg via INTRAMUSCULAR

## 2023-01-29 NOTE — Progress Notes (Signed)
Date last pap:Feb or March 2024 @ [facility] w/ Dr.Rivard.per pt. Last Depo-Provera: 11/09/22. Side Effects if any: none. Serum HCG indicated? N/A. Depo-Provera 150 mg IM given by: LaToya L CMA . Next appointment due 01/06-01/20.

## 2023-02-15 ENCOUNTER — Other Ambulatory Visit: Payer: Self-pay | Admitting: Internal Medicine

## 2023-02-15 DIAGNOSIS — I10 Essential (primary) hypertension: Secondary | ICD-10-CM

## 2023-02-16 NOTE — Telephone Encounter (Signed)
Requested Prescriptions  Refused Prescriptions Disp Refills   losartan (COZAAR) 25 MG tablet [Pharmacy Med Name: LOSARTAN POTASSIUM 25 MG TAB] 30 tablet 2    Sig: TAKE 1 TABLET (25 MG TOTAL) BY MOUTH DAILY.     Cardiovascular:  Angiotensin Receptor Blockers Failed - 02/15/2023  4:21 PM      Failed - Cr in normal range and within 180 days    Creat  Date Value Ref Range Status  04/24/2022 1.02 (H) 0.50 - 0.99 mg/dL Final         Failed - K in normal range and within 180 days    Potassium  Date Value Ref Range Status  04/24/2022 4.1 3.5 - 5.3 mmol/L Final  03/03/2013 3.7 3.5 - 5.1 mmol/L Final         Passed - Patient is not pregnant      Passed - Last BP in normal range    BP Readings from Last 1 Encounters:  01/29/23 131/87         Passed - Valid encounter within last 6 months    Recent Outpatient Visits           2 months ago Viral upper respiratory tract infection   [facility] Margarita Mail, DO   3 months ago Hypertension, unspecified type   [facility] Margarita Mail, DO   9 months ago Hypertension, unspecified type   [facility] Margarita Mail, DO   1 year ago Hypertension, unspecified type   [facility] Margarita Mail, DO   1 year ago Hypertension, unspecified type   [facility] Berniece Salines, FNP       Future Appointments             In 2 weeks Anyanwu, Jethro Bastos, MD [facility] for [facility], [facility]   In 2 months Deirdre Evener, MD [facility]   In 2 months Margarita Mail, DO [facility], [facility]

## 2023-02-25 ENCOUNTER — Ambulatory Visit
Admission: EM | Admit: 2023-02-25 | Discharge: 2023-02-25 | Disposition: A | Discharge status: Home / Self Care | Payer: 59 | Attending: Emergency Medicine | Admitting: Emergency Medicine

## 2023-02-25 DIAGNOSIS — U071 COVID-19: Secondary | ICD-10-CM | POA: Diagnosis not present

## 2023-02-25 MED ORDER — MOLNUPIRAVIR EUA 200MG CAPSULE: 4.0000 | ORAL_CAPSULE | Freq: Two times a day (BID) | ORAL | 0 refills | Status: AC

## 2023-02-25 NOTE — ED Triage Notes (Addendum)
Patient to Urgent Care with complaints of headaches/ sore throat/ body aches/ chills/ sweats/ nasal congestion/ cough.  Symptoms started Thursday. Taking Nyquil and Mucinex. Reports taking her rizatripan for her migraine w/o relief.   Covid positive via home test last night.

## 2023-02-25 NOTE — Discharge Instructions (Addendum)
Take the molnupiravir as directed.  See the attached handout on COVID.  Follow up with your primary care provider tomorrow.  Go to the emergency department if you have worsening symptoms.

## 2023-02-25 NOTE — ED Provider Notes (Signed)
Renaldo Fiddler    CSN: [id number] Arrival date & time: 02/25/23  0810      History   Chief Complaint Chief Complaint  Patient presents with   Covid Positive    HPI Anita Weaver is a 47 y.o. female.  Patient tested positive for COVID at home this morning.  She presents with bodyaches, chills, headache, sore throat, congestion, cough.  Treating with Mucinex and NyQuil.  Her symptoms started 2-3 days ago.  She denies fever, wheezing, shortness of breath, chest pain, or other symptoms.  She has not needed to use her albuterol.  Her medical history includes hypertension, asthma, allergies, migraine headache.       The history is provided by the patient and medical records.    Past Medical History:  Diagnosis Date   Allergy    Arthritis    Asthma    Chronic pelvic pain in female    Endometriosis    FH: breast cancer in first degree relative    Mother and sister both are BRCA positive; patient is BRCA negative   GERD (gastroesophageal reflux disease)    Headache    migraines   Heart murmur    Hyperlipidemia    Hypertension    Interstitial cystitis    Irritable bowel disease    Neck pain     Patient Active Problem List   Diagnosis Date Noted   Encounter for screening colonoscopy 04/06/2022   Hypertension 05/27/2021   Headache disorder 03/23/2020   COVID-19 12/30/2019   Arthritis 07/09/2018   Chronic interstitial cystitis 07/09/2018   Irritable bowel syndrome 07/09/2018   Scoliosis deformity of spine 07/09/2018   Asthma 04/27/2017   Acid reflux 04/27/2017   Hiatal hernia with GERD 04/27/2017   Contact dermatitis 05/15/2016   Fatigue 01/20/2016   Mild dysplasia of cervix (CIN I) 09/29/2014   Pap smear abnormality of cervix with LGSIL 09/21/2014   FH: genetic disease carrier 04/22/2014   Migraine headache 05/08/2013   Bipolar disorder, unspecified (HCC) 05/08/2013   Endometriosis 04/18/2012   Chronic pelvic pain in female 04/18/2012   Female genital  symptoms 04/18/2012    Past Surgical History:  Procedure Laterality Date   BREAST BIOPSY Left 02/08/2017   CHOLECYSTECTOMY     COLONOSCOPY WITH PROPOFOL N/A 04/06/2022   Procedure: COLONOSCOPY WITH PROPOFOL;  Surgeon: Midge Minium, MD;  Location: [facility] ENDOSCOPY;  Service: Endoscopy;  Laterality: N/A;   LAPAROSCOPY  2006   TUBAL LIGATION      OB History     Gravida  3   Para  3   Term  0   Preterm  0   AB  0   Living  4      SAB  0   IAB  0   Ectopic  0   Multiple      Live Births  3            Home Medications    Prior to Admission medications   Medication Sig Start Date End Date Taking? Authorizing Provider  molnupiravir EUA (LAGEVRIO) 200 mg CAPS capsule Take 4 capsules (800 mg total) by mouth 2 (two) times daily for 5 days. 02/25/23 03/02/23 Yes Mickie Bail, NP  amLODipine (NORVASC) 10 MG tablet Take 1 tablet (10 mg total) by mouth daily. 10/23/22   Margarita Mail, DO  benzonatate (TESSALON) 100 MG capsule Take 1 capsule (100 mg total) by mouth 2 (two) times daily as needed for cough. Patient not taking: Reported  on 02/25/2023 12/05/22   Margarita Mail, DO  cholecalciferol (VITAMIN D3) 25 MCG (1000 UNIT) tablet Take 1,000 Units by mouth daily.    [provider]  famotidine (PEPCID) 40 MG tablet Take 40 mg by mouth daily.    [provider]  fluticasone (FLONASE) 50 MCG/ACT nasal spray Place 2 sprays into both nostrils daily. 12/05/22   Margarita Mail, DO  levalbuterol [facility] HFA) 45 MCG/ACT inhaler INHALE 2 PUFFS INTO THE LUNGS EVERY 8 (EIGHT) HOURS AS NEEDED FOR WHEEZING OR SHORTNESS OF BREATH. 07/24/22 07/24/23  Berniece Salines, FNP  linaclotide [facility]) 145 MCG CAPS capsule Take 1 capsule (145 mcg total) by mouth daily before breakfast. Patient not taking: Reported on 12/05/2022 11/23/22   Midge Minium, MD  linaclotide [facility] Einstein) 72 MCG capsule Take 1 capsule (72 mcg total) by mouth daily before breakfast. Patient not  taking: Reported on 12/05/2022 11/23/22   Midge Minium, MD  losartan (COZAAR) 50 MG tablet Take 1 tablet (50 mg total) by mouth daily. 10/23/22   Margarita Mail, DO  lurasidone (LATUDA) 40 MG TABS tablet TAKE 1 TABLET BY MOUTH DAILY WITH BREAKFAST. 06/15/22   Mozingo, Thereasa Solo, NP  medroxyPROGESTERone (DEPO-PROVERA) 150 MG/ML injection Inject 1 mL (150 mg total) into the muscle every 3 (three) months. 06/05/22   Surrency Bing, MD  nortriptyline (PAMELOR) 50 MG capsule Take 50 mg by mouth at bedtime. 10/31/21   [provider]  ondansetron (ZOFRAN) 4 MG tablet Take one tablet every 8 hours as needed. #10 to last 30 days. For migraines. 01/12/22   Margarita Mail, DO  pantoprazole (PROTONIX) 40 MG tablet TAKE 1 TABLET BY MOUTH EVERY DAY 12/01/22   Midge Minium, MD  rizatriptan (MAXALT) 10 MG tablet Take 1 tablet (10 mg total) by mouth as needed for migraine. May repeat in 2 hours if needed 11/12/19   Jamelle Haring, MD    Family History Family History  Problem Relation Age of Onset   Cancer Mother 71       breast   Breast cancer Mother 46   Cancer Sister 56   Breast cancer Sister 85   Kidney disease Son    Heart attack Maternal Grandfather    Heart attack Paternal Grandmother    Heart attack Paternal Grandfather     Social History Social History   Tobacco Use   Smoking status: Never   Smokeless tobacco: Never  Vaping Use   Vaping status: Never Used  Substance Use Topics   Alcohol use: Yes    Alcohol/week: 0.0 standard drinks of alcohol    Comment: occasionally   Drug use: No     Allergies   Imitrex [sumatriptan], Demerol [meperidine], Flagyl [metronidazole], and Motrin [ibuprofen]   Review of Systems Review of Systems  Constitutional:  Positive for chills. Negative for fever.  HENT:  Positive for congestion and sore throat. Negative for ear pain.   Respiratory:  Positive for cough. Negative for shortness of breath and wheezing.   Cardiovascular:   Negative for chest pain and palpitations.  Gastrointestinal:  Negative for diarrhea and vomiting.  Neurological:  Positive for headaches.     Physical Exam Triage Vital Signs ED Triage Vitals [02/25/23 0848]  Encounter Vitals Group     BP      Systolic BP Percentile      Diastolic BP Percentile      Pulse      Resp 20     Temp 98.8 F (37.1 C)  Temp src      SpO2 97 %     Weight      Height      Head Circumference      Peak Flow      Pain Score      Pain Loc      Pain Education      Exclude from Growth Chart    No data found.  Updated Vital Signs BP 121/83   Pulse (!) 118   Temp 98.8 F (37.1 C)   Resp 20   SpO2 97%   Visual Acuity Right Eye Distance:   Left Eye Distance:   Bilateral Distance:    Right Eye Near:   Left Eye Near:    Bilateral Near:     Physical Exam Constitutional:      General: She is not in acute distress. HENT:     Right Ear: Tympanic membrane normal.     Left Ear: Tympanic membrane normal.     Nose: Rhinorrhea present.     Mouth/Throat:     Mouth: Mucous membranes are moist.     Pharynx: Oropharynx is clear.  Cardiovascular:     Rate and Rhythm: Normal rate and regular rhythm.     Heart sounds: Normal heart sounds.  Pulmonary:     Effort: Pulmonary effort is normal. No respiratory distress.     Breath sounds: Normal breath sounds. No wheezing.  Skin:    General: Skin is warm and dry.  Neurological:     Mental Status: She is alert.      UC Treatments / Results  Labs (all labs ordered are listed, but only abnormal results are displayed) Labs Reviewed - No data to display  EKG   Radiology No results found.  Procedures Procedures (including critical care time)  Medications Ordered in UC Medications - No data to display  Initial Impression / Assessment and Plan / UC Course  I have reviewed the triage vital signs and the nursing notes.  Pertinent labs & imaging results that were available during my care of the  patient were reviewed by me and considered in my medical decision making (see chart for details).    COVID-19.  Patient tested positive at home last night.  She is on day 2-3 of her symptoms.  She has history of asthma but has not had any wheezing or shortness of breath.  She has not needed her rescue inhaler.  Discussed treatment options.  She is not pregnant and does not plan to become pregnant.  The last lab work in her chart is from 10 months ago.  Treating today with molnupiravir.  Discussed that this is an emergency authorized medication for treatment of COVID.  Discussed side effects including nausea, diarrhea, dizziness.  Discussed other symptomatic treatment including Tylenol, rest, hydration.  Instructed patient to follow-up with PCP.  ED precautions discussed.  Patient agrees to plan of care.  Final Clinical Impressions(s) / UC Diagnoses   Final diagnoses:  COVID-19     Discharge Instructions      Take the molnupiravir as directed.  See the attached handout on COVID.  Follow up with your primary care provider tomorrow.  Go to the emergency department if you have worsening symptoms.        ED Prescriptions     Medication Sig Dispense Auth. Provider   molnupiravir EUA (LAGEVRIO) 200 mg CAPS capsule Take 4 capsules (800 mg total) by mouth 2 (two) times daily for 5 days. 40  capsule Mickie Bail, NP      PDMP not reviewed this encounter.   Mickie Bail, NP 02/25/23 [phone]

## 2023-03-06 ENCOUNTER — Telehealth: Payer: 59 | Admitting: Obstetrics & Gynecology

## 2023-03-19 ENCOUNTER — Other Ambulatory Visit: Payer: Self-pay | Admitting: Internal Medicine

## 2023-03-19 DIAGNOSIS — I10 Essential (primary) hypertension: Secondary | ICD-10-CM

## 2023-03-20 ENCOUNTER — Telehealth: Payer: 59 | Admitting: Obstetrics and Gynecology

## 2023-03-20 ENCOUNTER — Encounter: Payer: Self-pay | Admitting: Obstetrics and Gynecology

## 2023-03-20 DIAGNOSIS — Z3042 Encounter for surveillance of injectable contraceptive: Secondary | ICD-10-CM

## 2023-03-20 MED ORDER — MEDROXYPROGESTERONE ACETATE 150 MG/ML IM SUSP: 150.0000 mg | INTRAMUSCULAR | 4 refills | Status: AC

## 2023-03-20 NOTE — Telephone Encounter (Signed)
Requested Prescriptions  Refused Prescriptions Disp Refills   amLODipine (NORVASC) 10 MG tablet [Pharmacy Med Name: AMLODIPINE BESYLATE 10 MG TAB] 90 tablet 2    Sig: TAKE 1 TABLET BY MOUTH EVERY DAY     Cardiovascular: Calcium Channel Blockers 2 Failed - 03/19/2023  9:30 AM      Failed - Last Heart Rate in normal range    Pulse Readings from Last 1 Encounters:  02/25/23 (!) 118         Passed - Last BP in normal range    BP Readings from Last 1 Encounters:  02/25/23 121/83         Passed - Valid encounter within last 6 months    Recent Outpatient Visits           3 months ago Viral upper respiratory tract infection   [facility] Margarita Mail, DO   4 months ago Hypertension, unspecified type   [facility] Margarita Mail, DO   11 months ago Hypertension, unspecified type   [facility] Margarita Mail, DO   1 year ago Hypertension, unspecified type   [facility] Margarita Mail, DO   1 year ago Hypertension, unspecified type   [facility] Berniece Salines, FNP       Future Appointments             In 1 month Deirdre Evener, MD [facility]   In 1 month Margarita Mail, DO [facility], [facility]

## 2023-03-20 NOTE — Progress Notes (Signed)
Pt has concerned about being on Depo since Depo is currently dealing with law suits due to side effects pt are getting from being on Depo.   Pt is unsure of what other BC she would want to do.

## 2023-03-20 NOTE — Progress Notes (Signed)
GYNECOLOGY VIRTUAL VISIT ENCOUNTER NOTE  Provider location: Center for [facility]   Patient location: Home  I connected with Anita Weaver on 03/20/23 at  3:30 PM EST by MyChart Video Encounter and verified that I am speaking with the correct person using two identifiers.   I discussed the limitations, risks, security and privacy concerns of performing an evaluation and management service virtually and the availability of in person appointments. I also discussed with the patient that there may be a patient responsible charge related to this service. The patient expressed understanding and agreed to proceed.   History:  Anita Weaver is a 47 y.o. [id number] female being evaluated today for contraception counseling. Patient has been on depo-provera for several years for the management of endometriosis with good results. She is without any other complaints and wishes to continue with this method. She has questions regarding continuing depo-provera in light of several lawsuits. She denies any abnormal vaginal discharge, bleeding, pelvic pain or other concerns.       Past Medical History:  Diagnosis Date   Allergy    Arthritis    Asthma    Chronic pelvic pain in female    Endometriosis    FH: breast cancer in first degree relative    Mother and sister both are BRCA positive; patient is BRCA negative   GERD (gastroesophageal reflux disease)    Headache    migraines   Heart murmur    Hyperlipidemia    Hypertension    Interstitial cystitis    Irritable bowel disease    Neck pain    Past Surgical History:  Procedure Laterality Date   BREAST BIOPSY Left 02/08/2017   CHOLECYSTECTOMY     COLONOSCOPY WITH PROPOFOL N/A 04/06/2022   Procedure: COLONOSCOPY WITH PROPOFOL;  Surgeon: Midge Minium, MD;  Location: [facility] ENDOSCOPY;  Service: Endoscopy;  Laterality: N/A;   LAPAROSCOPY  2006   TUBAL LIGATION     The following portions of the patient's history were reviewed  and updated as appropriate: allergies, current medications, past family history, past medical history, past social history, past surgical history and problem list.   Health Maintenance:  Normal pap and negative HRHPV on 06/2021 (per patient report).  Normal mammogram on 06/2021.   Review of Systems:  Pertinent items noted in HPI and remainder of comprehensive ROS otherwise negative.  Physical Exam:   General:  Alert, oriented and cooperative. Patient appears to be in no acute distress.  Mental Status: Normal mood and affect. Normal behavior. Normal judgment and thought content.   Respiratory: Normal respiratory effort, no problems with respiration noted  Rest of physical exam deferred due to type of encounter  Labs and Imaging No results found for this or any previous visit (from the past 336 hour(s)). No results found.     Assessment and Plan:     1. Encounter for Depo-Provera contraception Reassurance provided Advised patient to continue annual exam with PCP and neurologist Patient may continue management of endometriosis with depo-provera Patient reports annual exam done with Dr. Estanislado Pandy office (will sign release of information to obtain pap smear records) but comes to our office for convenience of location for depo-provera administration - medroxyPROGESTERone (DEPO-PROVERA) 150 MG/ML injection; Inject 1 mL (150 mg total) into the muscle every 3 (three) months.  Dispense: 1 mL; Refill: 4       I discussed the assessment and treatment plan with the patient. The patient was provided an opportunity to  ask questions and all were answered. The patient agreed with the plan and demonstrated an understanding of the instructions.   The patient was advised to call back or seek an in-person evaluation/go to the ED if the symptoms worsen or if the condition fails to improve as anticipated.  I provided 10 minutes of face-to-face time during this encounter.   Catalina Antigua, MD Center for  [employer], [facility]

## 2023-04-09 ENCOUNTER — Other Ambulatory Visit: Payer: Self-pay | Admitting: Obstetrics and Gynecology

## 2023-04-09 DIAGNOSIS — Z1231 Encounter for screening mammogram for malignant neoplasm of breast: Secondary | ICD-10-CM

## 2023-04-19 ENCOUNTER — Ambulatory Visit: Payer: 59 | Admitting: Dermatology

## 2023-04-19 ENCOUNTER — Other Ambulatory Visit: Payer: Self-pay | Admitting: Internal Medicine

## 2023-04-19 DIAGNOSIS — I10 Essential (primary) hypertension: Secondary | ICD-10-CM

## 2023-04-23 NOTE — Telephone Encounter (Signed)
 Requested Prescriptions  Pending Prescriptions Disp Refills   amLODipine  (NORVASC ) 10 MG tablet [Pharmacy Med Name: AMLODIPINE  BESYLATE 10 MG TAB] 90 tablet 0    Sig: TAKE 1 TABLET BY MOUTH EVERY DAY     Cardiovascular: Calcium Channel Blockers 2 Failed - 04/23/2023 10:00 AM      Failed - Last Heart Rate in normal range    Pulse Readings from Last 1 Encounters:  02/25/23 (!) 118         Passed - Last BP in normal range    BP Readings from Last 1 Encounters:  02/25/23 121/83         Passed - Valid encounter within last 6 months    Recent Outpatient Visits           4 months ago Viral upper respiratory tract infection   [facility] Bernardo Fend, DO   6 months ago Hypertension, unspecified type   [facility] Bernardo Fend, DO   12 months ago Hypertension, unspecified type   [facility] Bernardo Fend, DO   1 year ago Hypertension, unspecified type   [facility] Bernardo Fend, DO   1 year ago Hypertension, unspecified type   [facility] Gareth Mliss FALCON, FNP       Future Appointments             In 1 week Bernardo Fend, DO [facility]             losartan  (COZAAR ) 50 MG tablet [Pharmacy Med Name: LOSARTAN  POTASSIUM 50 MG TAB] 90 tablet 0    Sig: TAKE 1 TABLET BY MOUTH EVERY DAY     Cardiovascular:  Angiotensin Receptor Blockers Failed - 04/23/2023 10:00 AM      Failed - Cr in normal range and within 180 days    Creat  Date Value Ref Range Status  04/24/2022 1.02 (H) 0.50 - 0.99 mg/dL Final         Failed - K in normal range and within 180 days    Potassium  Date Value Ref Range Status  04/24/2022 4.1 3.5 - 5.3 mmol/L Final  03/03/2013 3.7 3.5 - 5.1 mmol/L Final         Passed - Patient is not pregnant      Passed - Last BP in normal range    BP Readings from Last 1  Encounters:  02/25/23 121/83         Passed - Valid encounter within last 6 months    Recent Outpatient Visits           4 months ago Viral upper respiratory tract infection   [facility] Bernardo Fend, DO   6 months ago Hypertension, unspecified type   [facility] Bernardo Fend, DO   12 months ago Hypertension, unspecified type   [facility] Bernardo Fend, DO   1 year ago Hypertension, unspecified type   [facility] Bernardo Fend, DO   1 year ago Hypertension, unspecified type   [facility] Gareth Mliss FALCON, FNP       Future Appointments             In 1 week Bernardo Fend, DO [facility], [facility]

## 2023-04-25 ENCOUNTER — Ambulatory Visit (INDEPENDENT_AMBULATORY_CARE_PROVIDER_SITE_OTHER): Payer: 59

## 2023-04-25 ENCOUNTER — Ambulatory Visit: Payer: 59 | Admitting: Internal Medicine

## 2023-04-25 DIAGNOSIS — Z3042 Encounter for surveillance of injectable contraceptive: Secondary | ICD-10-CM | POA: Diagnosis not present

## 2023-04-25 MED ORDER — MEDROXYPROGESTERONE ACETATE 150 MG/ML IM SUSP
150.0000 mg | Freq: Once | INTRAMUSCULAR | Status: AC
  Administered 2023-04-25: 150 mg via INTRAMUSCULAR

## 2023-04-25 NOTE — Progress Notes (Signed)
  Last Depo-Provera: .01/29/23 Side Effects if any: NA. Serum HCG indicated? NA. Depo-Provera 150 mg IM given by: Chriss Driver. Next appointment due 07/11/23-07/25/23.

## 2023-04-30 ENCOUNTER — Ambulatory Visit: Payer: 59 | Admitting: Physician Assistant

## 2023-04-30 ENCOUNTER — Encounter: Payer: Self-pay | Admitting: Physician Assistant

## 2023-04-30 VITALS — BP 126/78 | HR 90 | Resp 16 | Ht 68.0 in | Wt 224.0 lb

## 2023-04-30 DIAGNOSIS — E66811 Obesity, class 1: Secondary | ICD-10-CM | POA: Diagnosis not present

## 2023-04-30 DIAGNOSIS — K219 Gastro-esophageal reflux disease without esophagitis: Secondary | ICD-10-CM

## 2023-04-30 DIAGNOSIS — R110 Nausea: Secondary | ICD-10-CM

## 2023-04-30 DIAGNOSIS — Z6834 Body mass index (BMI) 34.0-34.9, adult: Secondary | ICD-10-CM

## 2023-04-30 DIAGNOSIS — E6609 Other obesity due to excess calories: Secondary | ICD-10-CM

## 2023-04-30 DIAGNOSIS — I10 Essential (primary) hypertension: Secondary | ICD-10-CM

## 2023-04-30 MED ORDER — ONDANSETRON HCL 4 MG PO TABS: ORAL_TABLET | ORAL | 0 refills | Status: DC

## 2023-04-30 NOTE — Patient Instructions (Signed)
Please call your insurance company and ask if they cover weight loss medications - particularly Wegovy and/or Zpebound. Ask if there are any prerequisites for coverage that you need to meet before they will provide coverage.

## 2023-04-30 NOTE — Assessment & Plan Note (Signed)
Chronic, ongoing  She reports this seems to be better managed at this time since she is taking Pepcid and pantoprazole per GI instructions She is having a flare 1-2 times per month and states she has nausea often after eating She denies recent antacid use  Recommend she continues with current regimen and follows up with GI as directed. Follow up in 6 months or sooner if concerns arise

## 2023-04-30 NOTE — Assessment & Plan Note (Addendum)
Chronic, ongoing  Patient reports continued weight gain since being on Depo and she would like to discuss weight loss medications Recommend she calls insurance provider to discuss coverage. Recommend counting calories and engaging in regular exercise to further assist with symptoms Reviewed that GLP-1 medications usually cause significant constipation and gastric motility changes and this should be a consideration before she starts given prior history.  Recommend follow up in about 3 months to assess lifestyle changes and discuss medications further if still desired.

## 2023-04-30 NOTE — Progress Notes (Signed)
Established Patient Office Visit  Name: Anita Weaver   MRN: [id number]    DOB: Aug 14, 1975   Date:04/30/2023  Today's Provider: Jacquelin Hawking, MHS, PA-C Introduced myself to the patient as a PA-C and provided education on APPs in clinical practice.         Subjective  Chief Complaint  Chief Complaint  Patient presents with   Medical Management of Chronic Issues    6 months    HPI  Hypertension: - Medications: Amlodipine 10 mg PO every day, Losartan 50 mg PO every day,  - Compliance: Good  - Checking BP at home: not checking since it has become more stable - Denies any SOB, CP, vision changes, LE edema, medication SEs, or symptoms of hypotension - Diet: She is trying to stay away from acidic foods to help with GERD  - Exercise:  She is not engaged in regular exercise    GERD She was seen by GI in Aug for management of this and constipation   GERD control status: better She has been taking Pepcid and pantoprazole but still has flares  Satisfied with current treatment? Room for improvement. Having flares 1-2 times per month  She reports having nausea after eating very frequently.  Heartburn frequency:  Medication side effects: no  Medication compliance: stable Antacid use frequency: She has not been using antacids at all.  Blood in stool: no EGD: yes- over 10 years ago per patient   She is interested in starting a weight loss medication - preferably would like to start an injection  She report she has been gaining weight since switching to Depo and seems to be continuing to gain it  She is still having constipation- she is no longer using Linzess due diarrhea and cramping     Patient Active Problem List   Diagnosis Date Noted   Class 1 obesity due to excess calories with serious comorbidity and body mass index (BMI) of 34.0 to 34.9 in adult 04/30/2023   Encounter for screening colonoscopy 04/06/2022   Hypertension 05/27/2021   Headache disorder 03/23/2020    COVID-19 12/30/2019   Arthritis 07/09/2018   Chronic interstitial cystitis 07/09/2018   Irritable bowel syndrome 07/09/2018   Scoliosis deformity of spine 07/09/2018   Asthma 04/27/2017   Acid reflux 04/27/2017   Hiatal hernia with GERD 04/27/2017   Contact dermatitis 05/15/2016   Fatigue 01/20/2016   Mild dysplasia of cervix (CIN I) 09/29/2014   Pap smear abnormality of cervix with LGSIL 09/21/2014   FH: genetic disease carrier 04/22/2014   Migraine headache 05/08/2013   Bipolar disorder, unspecified (HCC) 05/08/2013   Endometriosis 04/18/2012   Chronic pelvic pain in female 04/18/2012   Female genital symptoms 04/18/2012    Past Surgical History:  Procedure Laterality Date   BREAST BIOPSY Left 02/08/2017   CHOLECYSTECTOMY     COLONOSCOPY WITH PROPOFOL N/A 04/06/2022   Procedure: COLONOSCOPY WITH PROPOFOL;  Surgeon: Midge Minium, MD;  Location: [facility] ENDOSCOPY;  Service: Endoscopy;  Laterality: N/A;   LAPAROSCOPY  2006   TUBAL LIGATION      Family History  Problem Relation Age of Onset   Cancer Mother 88       breast   Breast cancer Mother 32   Cancer Sister 46   Breast cancer Sister 78   Kidney disease Son    Heart attack Maternal Grandfather    Heart attack Paternal Grandmother    Heart attack Paternal Grandfather  Social History   Tobacco Use   Smoking status: Never   Smokeless tobacco: Never  Substance Use Topics   Alcohol use: Yes    Alcohol/week: 0.0 standard drinks of alcohol    Comment: occasionally     Current Outpatient Medications:    amLODipine (NORVASC) 10 MG tablet, TAKE 1 TABLET BY MOUTH EVERY DAY, Disp: 90 tablet, Rfl: 0   cholecalciferol (VITAMIN D3) 25 MCG (1000 UNIT) tablet, Take 1,000 Units by mouth daily., Disp: , Rfl:    famotidine (PEPCID) 40 MG tablet, Take 40 mg by mouth daily., Disp: , Rfl:    fluticasone (FLONASE) 50 MCG/ACT nasal spray, Place 2 sprays into both nostrils daily., Disp: 16 g, Rfl: 6   levalbuterol (XOPENEX HFA)  45 MCG/ACT inhaler, INHALE 2 PUFFS INTO THE LUNGS EVERY 8 (EIGHT) HOURS AS NEEDED FOR WHEEZING OR SHORTNESS OF BREATH., Disp: 15 each, Rfl: 2   losartan (COZAAR) 50 MG tablet, TAKE 1 TABLET BY MOUTH EVERY DAY, Disp: 90 tablet, Rfl: 0   lurasidone (LATUDA) 40 MG TABS tablet, TAKE 1 TABLET BY MOUTH DAILY WITH BREAKFAST., Disp: 90 tablet, Rfl: 3   medroxyPROGESTERone (DEPO-PROVERA) 150 MG/ML injection, Inject 1 mL (150 mg total) into the muscle every 3 (three) months., Disp: 1 mL, Rfl: 4   nortriptyline (PAMELOR) 50 MG capsule, Take 50 mg by mouth at bedtime., Disp: , Rfl:    pantoprazole (PROTONIX) 40 MG tablet, TAKE 1 TABLET BY MOUTH EVERY DAY, Disp: 30 tablet, Rfl: 11   rizatriptan (MAXALT) 10 MG tablet, Take 1 tablet (10 mg total) by mouth as needed for migraine. May repeat in 2 hours if needed, Disp: 10 tablet, Rfl: 3   benzonatate (TESSALON) 100 MG capsule, Take 1 capsule (100 mg total) by mouth 2 (two) times daily as needed for cough. (Patient not taking: Reported on 04/30/2023), Disp: 20 capsule, Rfl: 0   ondansetron (ZOFRAN) 4 MG tablet, Take one tablet every 8 hours as needed. #10 to last 30 days. For migraines., Disp: 20 tablet, Rfl: 0  Allergies  Allergen Reactions   Imitrex [Sumatriptan] Other (See Comments)    Heart palpitations, hot/cold sweats   Demerol [Meperidine] Other (See Comments)    hallucinations   Flagyl [Metronidazole] Hives   Motrin [Ibuprofen] Other (See Comments)    swelling    I personally reviewed active problem list, medication list, allergies, notes from last encounter, lab results with the patient/caregiver today.   Review of Systems  Eyes:  Negative for blurred vision and double vision.  Respiratory:  Negative for shortness of breath and wheezing.   Cardiovascular:  Negative for chest pain, palpitations and leg swelling.  Gastrointestinal:  Positive for constipation, heartburn and nausea. Negative for blood in stool, diarrhea and vomiting.  Neurological:   Negative for dizziness and headaches.      Objective  Vitals:   04/30/23 0853  BP: 126/78  Pulse: 90  Resp: 16  SpO2: 97%  Weight: 224 lb (101.6 kg)  Height: 5\' 8"  (1.727 m)    Body mass index is 34.06 kg/m.  Physical Exam Vitals reviewed.  Constitutional:      General: She is awake.     Appearance: Normal appearance. She is well-developed and well-groomed.  HENT:     Head: Normocephalic and atraumatic.  Cardiovascular:     Rate and Rhythm: Normal rate and regular rhythm.     Pulses: Normal pulses.          Radial pulses are 2+ on the right  side and 2+ on the left side.     Heart sounds: Normal heart sounds. No murmur heard.    No friction rub. No gallop.  Pulmonary:     Effort: Pulmonary effort is normal.     Breath sounds: Normal breath sounds. No decreased air movement. No decreased breath sounds, wheezing, rhonchi or rales.  Musculoskeletal:     Cervical back: Normal range of motion.     Right lower leg: No edema.     Left lower leg: No edema.  Neurological:     General: No focal deficit present.     Mental Status: She is alert and oriented to person, place, and time. Mental status is at baseline.     GCS: GCS eye subscore is 4. GCS verbal subscore is 5. GCS motor subscore is 6.  Psychiatric:        Attention and Perception: Attention and perception normal.        Mood and Affect: Mood and affect normal.        Speech: Speech normal.        Behavior: Behavior normal. Behavior is cooperative.        Thought Content: Thought content normal.        Cognition and Memory: Cognition normal.      No results found for this or any previous visit (from the past 2160 hours).   PHQ2/9:    12/05/2022    3:43 PM 10/23/2022    2:47 PM 04/24/2022    8:03 AM 01/12/2022    2:34 PM 11/02/2021    1:46 PM  Depression screen PHQ 2/9  Decreased Interest 0 0 0 0 0  Down, Depressed, Hopeless 0 0 0 0 0  PHQ - 2 Score 0 0 0 0 0  Altered sleeping 0 0  0 0  Tired, decreased  energy 0 0  0 0  Change in appetite 0 0  0 0  Feeling bad or failure about yourself  0 0  0 0  Trouble concentrating 0 0  0 0  Moving slowly or fidgety/restless 0 0  0 0  Suicidal thoughts 0 0  0 0  PHQ-9 Score 0 0  0 0  Difficult doing work/chores Not difficult at all Not difficult at all  Not difficult at all Not difficult at all      Fall Risk:    12/05/2022    3:43 PM 10/23/2022    2:47 PM 04/24/2022    8:03 AM 01/12/2022    2:32 PM 11/02/2021    1:46 PM  Fall Risk   Falls in the past year? 1 0 0 0 0  Number falls in past yr: 1 0 0 0 0  Injury with Fall? 1 0 0 0 0  Risk for fall due to :   History of fall(s)    Follow up   Falls evaluation completed        Functional Status Survey:      Assessment & Plan  Problem List Items Addressed This Visit       Cardiovascular and Mediastinum   Hypertension - Primary   Chronic, historic condition Appears well managed, BP in goal today in office Appears to be tolerating current regimen well. Currently taking Amlodipine 10 mg PO every day, Losartan 50 mg PO every day,  Continue current regimen Follow up in 6 months or sooner if concerns arise        Relevant Orders   COMPLETE METABOLIC PANEL WITH  GFR   CBC w/Diff/Platelet     Digestive   Acid reflux (Chronic)   Chronic, ongoing  She reports this seems to be better managed at this time since she is taking Pepcid and pantoprazole per GI instructions She is having a flare 1-2 times per month and states she has nausea often after eating She denies recent antacid use  Recommend she continues with current regimen and follows up with GI as directed. Follow up in 6 months or sooner if concerns arise        Relevant Medications   ondansetron (ZOFRAN) 4 MG tablet     Other   Class 1 obesity due to excess calories with serious comorbidity and body mass index (BMI) of 34.0 to 34.9 in adult   Chronic, ongoing  Patient reports continued weight gain since being on Depo and she  would like to discuss weight loss medications Recommend she calls insurance provider to discuss coverage. Recommend counting calories and engaging in regular exercise to further assist with symptoms Reviewed that GLP-1 medications usually cause significant constipation and gastric motility changes and this should be a consideration before she starts given prior history.  Recommend follow up in about 3 months to assess lifestyle changes and discuss medications further if still desired.       Relevant Orders   Lipid Profile   HgB A1c   TSH   Other Visit Diagnoses       Nausea       Relevant Medications   ondansetron (ZOFRAN) 4 MG tablet        Return in about 3 months (around 07/29/2023) for Obesity, GERD, HTN .   I, Shatima Zalar E Shirlee Whitmire, PA-C, have reviewed all documentation for this visit. The documentation on 04/30/23 for the exam, diagnosis, procedures, and orders are all accurate and complete.   Jacquelin Hawking, MHS, PA-C [facility]

## 2023-04-30 NOTE — Assessment & Plan Note (Signed)
Chronic, historic condition Appears well managed, BP in goal today in office Appears to be tolerating current regimen well. Currently taking Amlodipine 10 mg PO every day, Losartan 50 mg PO every day,  Continue current regimen Follow up in 6 months or sooner if concerns arise

## 2023-05-01 ENCOUNTER — Encounter: Payer: Self-pay | Admitting: Physician Assistant

## 2023-05-01 ENCOUNTER — Encounter: Payer: Self-pay | Admitting: Internal Medicine

## 2023-05-01 LAB — COMPLETE METABOLIC PANEL WITH GFR
AG Ratio: 1.6 (calc) (ref 1.0–2.5)
ALT: 15 U/L (ref 6–29)
AST: 13 U/L (ref 10–35)
Albumin: 4.6 g/dL (ref 3.6–5.1)
Alkaline phosphatase (APISO): 69 U/L (ref 31–125)
BUN/Creatinine Ratio: 7 (calc) (ref 6–22)
BUN: 7 mg/dL (ref 7–25)
CO2: 26 mmol/L (ref 20–32)
Calcium: 9.7 mg/dL (ref 8.6–10.2)
Chloride: 102 mmol/L (ref 98–110)
Creat: 1 mg/dL — ABNORMAL HIGH (ref 0.50–0.99)
Globulin: 2.9 g/dL (ref 1.9–3.7)
Glucose, Bld: 90 mg/dL (ref 65–99)
Potassium: 4.1 mmol/L (ref 3.5–5.3)
Sodium: 138 mmol/L (ref 135–146)
Total Bilirubin: 0.7 mg/dL (ref 0.2–1.2)
Total Protein: 7.5 g/dL (ref 6.1–8.1)
eGFR: 70 mL/min/{1.73_m2} (ref >=60)

## 2023-05-01 LAB — LIPID PANEL
Cholesterol: 204 mg/dL — ABNORMAL HIGH (ref <200)
HDL: 40 mg/dL — ABNORMAL LOW (ref >=50)
LDL Cholesterol (Calc): 132 mg/dL — ABNORMAL HIGH
Non-HDL Cholesterol (Calc): 164 mg/dL — ABNORMAL HIGH (ref <130)
Total CHOL/HDL Ratio: 5.1 (calc) — ABNORMAL HIGH (ref <5.0)
Triglycerides: 183 mg/dL — ABNORMAL HIGH (ref <150)

## 2023-05-01 LAB — CBC WITH DIFFERENTIAL/PLATELET
Absolute Lymphocytes: 3249 {cells}/uL (ref 850–3900)
Absolute Monocytes: 608 {cells}/uL (ref 200–950)
Basophils Absolute: 67 {cells}/uL (ref 0–200)
Basophils Relative: 0.7 %
Eosinophils Absolute: 399 {cells}/uL (ref 15–500)
Eosinophils Relative: 4.2 %
HCT: 49.9 % — ABNORMAL HIGH (ref 35.0–45.0)
Hemoglobin: 17.2 g/dL — ABNORMAL HIGH (ref 11.7–15.5)
MCH: 30.2 pg (ref 27.0–33.0)
MCHC: 34.5 g/dL (ref 32.0–36.0)
MCV: 87.5 fL (ref 80.0–100.0)
MPV: 9 fL (ref 7.5–12.5)
Monocytes Relative: 6.4 %
Neutro Abs: 5178 {cells}/uL (ref 1500–7800)
Neutrophils Relative %: 54.5 %
Platelets: 416 10*3/uL — ABNORMAL HIGH (ref 140–400)
RBC: 5.7 10*6/uL — ABNORMAL HIGH (ref 3.80–5.10)
RDW: 12.8 % (ref 11.0–15.0)
Total Lymphocyte: 34.2 %
WBC: 9.5 10*3/uL (ref 3.8–10.8)

## 2023-05-01 LAB — HEMOGLOBIN A1C
Hgb A1c MFr Bld: 4.9 %{Hb} (ref <5.7)
Mean Plasma Glucose: 94 mg/dL
eAG (mmol/L): 5.2 mmol/L

## 2023-05-01 LAB — TSH: TSH: 1.44 m[IU]/L

## 2023-05-01 NOTE — Progress Notes (Signed)
Your labs are back Your CMP is overall normal/stable.  Your thyroid testing was normal  Your A1c was 4.9% which is in normal range.  Your CBC did not show signs of anemia but your RBC and hemoglobin was a bit elevated. This can occur with a patient is having increased work of breathing or unmanaged sleep apnea. I recommend follow up with your PCP to discuss management options.  Your cholesterol is elevated and appears to be increased from previous labs.  I recommend a trial of diet and exercise to see if we can control this without starting medication.  This would include reducing saturated fats in your daily diet and increasing "good" fats such as those from olive oil, tree nuts, avocado, etc.  I also recommend increasing your weekly exercise to include at least 150 minutes of moderate intensity physical activity.  If these measures do not improve your cholesterol over the next 6 months I recommend starting a medication to manage your cholesterol Please let us know if you have further questions or concerns.

## 2023-05-02 ENCOUNTER — Ambulatory Visit: Payer: 59 | Admitting: Dermatology

## 2023-05-02 ENCOUNTER — Telehealth: Payer: 59 | Admitting: Internal Medicine

## 2023-05-02 DIAGNOSIS — G4733 Obstructive sleep apnea (adult) (pediatric): Secondary | ICD-10-CM

## 2023-05-02 DIAGNOSIS — E785 Hyperlipidemia, unspecified: Secondary | ICD-10-CM | POA: Diagnosis not present

## 2023-05-02 DIAGNOSIS — E669 Obesity, unspecified: Secondary | ICD-10-CM | POA: Diagnosis not present

## 2023-05-02 DIAGNOSIS — E66811 Obesity, class 1: Secondary | ICD-10-CM

## 2023-05-02 DIAGNOSIS — I10 Essential (primary) hypertension: Secondary | ICD-10-CM

## 2023-05-02 MED ORDER — ZEPBOUND 2.5 MG/0.5ML ~~LOC~~ SOAJ: 2.5000 mg | SUBCUTANEOUS | 0 refills | Status: DC

## 2023-05-02 NOTE — Progress Notes (Signed)
Virtual Visit via Video Note  I connected with Anita Weaver on 05/02/23 at 11:00 AM EST by a video enabled telemedicine application and verified that I am speaking with the correct person using two identifiers.  Location: Patient: Home Provider: Home   I discussed the limitations of evaluation and management by telemedicine and the availability of in person appointments. The patient expressed understanding and agreed to proceed.  History of Present Illness:  Patient is presenting via telemedicine to discuss weight loss medication. She was seen in the office earlier in the week for follow up on chronic medical conditions and had labs done. Her weight at that visit was 224 lbs. She does have co-morbidities, including HTN, HLD and OSA, unable to tolerate CPAP. She does eat out frequently but will try to eat healthy occasionally. She is eating about 2 meals a day but does eat a lot of sugar in her diet. No current exercise but is planning on starting.    Observations/Objective:  General: well appearing, no acute distress ENT: conjunctiva normal appearing bilaterally  Skin: no rashes, cyanosis or abnormal bruising noted Neuro: answers all questions appropriately   Assessment and Plan:  1. Obesity (BMI 30.0-34.9) (Primary)/Primary hypertension/Hyperlipidemia, unspecified hyperlipidemia type/OSA (obstructive sleep apnea): Discussed how medication works, potential side effects and contraindications. No personal or family history of medullary thyroid carcinoma. Discussed importance of starting lifestyle modifications along with medication as well. Will prescribe low dose Zepbound, patient will call for recheck 3-4 weeks after starting the medication.   - tirzepatide (ZEPBOUND) 2.5 MG/0.5ML Pen; Inject 2.5 mg into the skin once a week.  Dispense: 2 mL; Refill: 0   Follow Up Instructions: patient will call and scheduled 3-4 weeks after she starts medication    I discussed the assessment and  treatment plan with the patient. The patient was provided an opportunity to ask questions and all were answered. The patient agreed with the plan and demonstrated an understanding of the instructions.   The patient was advised to call back or seek an in-person evaluation if the symptoms worsen or if the condition fails to improve as anticipated.  I provided 22 minutes of non-face-to-face time during this encounter.   Margarita Mail, DO

## 2023-05-04 ENCOUNTER — Other Ambulatory Visit: Payer: Self-pay | Admitting: Physician Assistant

## 2023-05-04 DIAGNOSIS — R413 Other amnesia: Secondary | ICD-10-CM

## 2023-05-08 ENCOUNTER — Encounter: Payer: Self-pay | Admitting: Internal Medicine

## 2023-05-21 ENCOUNTER — Encounter: Payer: Self-pay | Admitting: Internal Medicine

## 2023-05-21 ENCOUNTER — Ambulatory Visit: Payer: 59 | Admitting: Internal Medicine

## 2023-05-21 DIAGNOSIS — E66811 Obesity, class 1: Secondary | ICD-10-CM

## 2023-05-21 DIAGNOSIS — I10 Essential (primary) hypertension: Secondary | ICD-10-CM | POA: Diagnosis not present

## 2023-05-21 DIAGNOSIS — G4733 Obstructive sleep apnea (adult) (pediatric): Secondary | ICD-10-CM

## 2023-05-21 DIAGNOSIS — E785 Hyperlipidemia, unspecified: Secondary | ICD-10-CM

## 2023-05-21 MED ORDER — ZEPBOUND 2.5 MG/0.5ML ~~LOC~~ SOAJ: 2.5000 mg | SUBCUTANEOUS | 2 refills | Status: DC

## 2023-05-21 NOTE — Progress Notes (Signed)
 Established Patient Office Visit  Subjective   Patient ID: Anita Weaver, female    DOB: 05/08/1975  Age: 48 y.o. MRN: [id number]  Chief Complaint  Patient presents with   Obesity    Discuss zepbound     HPI  Patient is here for follow up on weight loss medication. She was started on Zepbound  2.5 mg 3 weeks ago, so far she is doing well and lost 10 pounds. She did have a viral illness and was vomiting for a few days but this resolved. She has some general queasiness after the injection and increased acid reflux but overall tolerated well. She is also working on lifestyle choices and monitoring eating habits.   Patient Active Problem List   Diagnosis Date Noted   Class 1 obesity due to excess calories with serious comorbidity and body mass index (BMI) of 34.0 to 34.9 in adult 04/30/2023   Encounter for screening colonoscopy 04/06/2022   Hypertension 05/27/2021   Headache disorder 03/23/2020   COVID-19 12/30/2019   Arthritis 07/09/2018   Chronic interstitial cystitis 07/09/2018   Irritable bowel syndrome 07/09/2018   Scoliosis deformity of spine 07/09/2018   Asthma 04/27/2017   Acid reflux 04/27/2017   Hiatal hernia with GERD 04/27/2017   Contact dermatitis 05/15/2016   Fatigue 01/20/2016   Mild dysplasia of cervix (CIN I) 09/29/2014   Pap smear abnormality of cervix with LGSIL 09/21/2014   FH: genetic disease carrier 04/22/2014   Migraine headache 05/08/2013   Bipolar disorder, unspecified (HCC) 05/08/2013   Endometriosis 04/18/2012   Chronic pelvic pain in female 04/18/2012   Female genital symptoms 04/18/2012   Past Medical History:  Diagnosis Date   Allergy    Arthritis    Asthma    Chronic pelvic pain in female    Endometriosis    FH: breast cancer in first degree relative    Mother and sister both are BRCA positive; patient is BRCA negative   GERD (gastroesophageal reflux disease)    Headache    migraines   Heart murmur    Hyperlipidemia    Hypertension     Interstitial cystitis    Irritable bowel disease    Neck pain    Past Surgical History:  Procedure Laterality Date   BREAST BIOPSY Left 02/08/2017   CHOLECYSTECTOMY     COLONOSCOPY WITH PROPOFOL  N/A 04/06/2022   Procedure: COLONOSCOPY WITH PROPOFOL ;  Surgeon: Marnee Sink, MD;  Location: [facility] ENDOSCOPY;  Service: Endoscopy;  Laterality: N/A;   LAPAROSCOPY  2006   TUBAL LIGATION     Social History   Tobacco Use   Smoking status: Never   Smokeless tobacco: Never  Vaping Use   Vaping status: Never Used  Substance Use Topics   Alcohol use: Yes    Alcohol/week: 0.0 standard drinks of alcohol    Comment: occasionally   Drug use: No   Social History   Socioeconomic History   Marital status: Married    Spouse name: Autry Legions   Number of children: 3   Years of education: 12   Highest education level: Not on file  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  Tobacco Use   Smoking status: Never   Smokeless tobacco: Never  Vaping Use   Vaping status: Never Used  Substance and Sexual Activity   Alcohol use: Yes    Alcohol/week: 0.0 standard drinks of alcohol    Comment: occasionally   Drug use: No   Sexual activity: Yes    Partners: Male  Birth control/protection: Surgical    Comment: tubaligation  Other Topics Concern   Not on file  Social History Narrative   ** Merged History Encounter **       ** Data from: 02/09/14 Enc Dept: [facility]       ** Data from: 11/10/13 Enc Dept: [facility]   Patient lives at home with her husband Autry Legions)   Patient works full time.   Right handed.   Caffeine- none   Education- high school   Social Drivers of Corporate investment banker Strain: Not on file  Food Insecurity: Not on file  Transportation Needs: Not on file  Physical Activity: Not on file  Stress: Not on file  Social Connections: Not on file  Intimate Partner Violence: Not on file   Family Status  Relation Name Status   Mother  Alive   Father  Alive    Sister  Alive   Son  Alive   MGM  Alive   MGF  Deceased       heart attack   PGM  Deceased       heart attack   PGF  Deceased       heart attack   Son  Alive   Son  Alive  No partnership data on file   Family History  Problem Relation Age of Onset   Cancer Mother 67       breast   Breast cancer Mother 13   Cancer Sister 34   Breast cancer Sister 38   Kidney disease Son    Heart attack Maternal Grandfather    Heart attack Paternal Grandmother    Heart attack Paternal Grandfather    Allergies  Allergen Reactions   Imitrex  [Sumatriptan ] Other (See Comments)    Heart palpitations, hot/cold sweats   Demerol [Meperidine] Other (See Comments)    hallucinations   Flagyl  [Metronidazole ] Hives   Motrin  [Ibuprofen ] Other (See Comments)    swelling      Review of Systems  Gastrointestinal:  Positive for heartburn. Negative for abdominal pain, constipation, nausea and vomiting.      Objective:     BP 124/82 (Cuff Size: Large)   Pulse 98   Temp 97.9 F (36.6 C) (Oral)   Resp 16   Ht 5\' 8"  (1.727 m)   Wt 214 lb 1.6 oz (97.1 kg)   SpO2 95%   BMI 32.55 kg/m  BP Readings from Last 3 Encounters:  05/21/23 124/82  04/30/23 126/78  02/25/23 121/83   Wt Readings from Last 3 Encounters:  05/21/23 214 lb 1.6 oz (97.1 kg)  04/30/23 224 lb (101.6 kg)  12/05/22 218 lb 4.8 oz (99 kg)      Physical Exam Constitutional:      Appearance: Normal appearance.  HENT:     Head: Normocephalic and atraumatic.  Eyes:     Conjunctiva/sclera: Conjunctivae normal.  Cardiovascular:     Rate and Rhythm: Normal rate and regular rhythm.  Pulmonary:     Effort: Pulmonary effort is normal.     Breath sounds: Normal breath sounds.  Skin:    General: Skin is warm and dry.  Neurological:     General: No focal deficit present.     Mental Status: She is alert. Mental status is at baseline.  Psychiatric:        Mood and Affect: Mood normal.        Behavior: Behavior normal.       No results found  for any visits on 05/21/23.  Last CBC Lab Results  Component Value Date   WBC 9.5 04/30/2023   HGB 17.2 (H) 04/30/2023   HCT 49.9 (H) 04/30/2023   MCV 87.5 04/30/2023   MCH 30.2 04/30/2023   RDW 12.8 04/30/2023   PLT 416 (H) 04/30/2023   Last metabolic panel Lab Results  Component Value Date   GLUCOSE 90 04/30/2023   NA 138 04/30/2023   K 4.1 04/30/2023   CL 102 04/30/2023   CO2 26 04/30/2023   BUN 7 04/30/2023   CREATININE 1.00 (H) 04/30/2023   EGFR 70 04/30/2023   CALCIUM 9.7 04/30/2023   PROT 7.5 04/30/2023   ALBUMIN 3.8 03/03/2013   BILITOT 0.7 04/30/2023   ALKPHOS 41 (L) 03/03/2013   AST 13 04/30/2023   ALT 15 04/30/2023   ANIONGAP 6 (L) 03/03/2013   Last lipids Lab Results  Component Value Date   CHOL 204 (H) 04/30/2023   HDL 40 (L) 04/30/2023   LDLCALC 132 (H) 04/30/2023   TRIG 183 (H) 04/30/2023   CHOLHDL 5.1 (H) 04/30/2023   Last hemoglobin A1c Lab Results  Component Value Date   HGBA1C 4.9 04/30/2023   Last thyroid functions Lab Results  Component Value Date   TSH 1.44 04/30/2023   T4TOTAL 8.3 01/23/2019   Last vitamin D  Lab Results  Component Value Date   VD25OH 30 04/24/2022   Last vitamin B12 and Folate Lab Results  Component Value Date   VITAMINB12 799 04/24/2022   FOLATE 7.0 04/24/2022      The 10-year ASCVD risk score (Arnett DK, et al., 2019) is: 2%    Assessment & Plan:   1. Obesity (BMI 30.0-34.9)/Primary hypertension/Hyperlipidemia, unspecified hyperlipidemia type/OSA (obstructive sleep apnea): Doing well on medication, will refill current dose until she plateaus with her weight.   - tirzepatide  (ZEPBOUND ) 2.5 MG/0.5ML Pen; Inject 2.5 mg into the skin once a week.  Dispense: 2 mL; Refill: 2   Return in about 3 months (around 08/18/2023).    Rockney Cid, DO

## 2023-06-04 ENCOUNTER — Other Ambulatory Visit: Payer: Self-pay | Admitting: Internal Medicine

## 2023-06-04 DIAGNOSIS — R110 Nausea: Secondary | ICD-10-CM

## 2023-06-04 MED ORDER — ONDANSETRON HCL 4 MG PO TABS: ORAL_TABLET | ORAL | 1 refills | Status: DC

## 2023-06-05 ENCOUNTER — Other Ambulatory Visit: Payer: Self-pay | Admitting: Gastroenterology

## 2023-06-05 ENCOUNTER — Encounter: Payer: Self-pay | Admitting: Gastroenterology

## 2023-06-05 MED ORDER — VOQUEZNA 10 MG PO TABS: 1.0000 | ORAL_TABLET | Freq: Every day | ORAL | 5 refills | Status: DC

## 2023-06-05 NOTE — Addendum Note (Signed)
 Addended by: Roena Malady on: 06/05/2023 05:07 PM   Modules accepted: Orders

## 2023-06-11 ENCOUNTER — Other Ambulatory Visit: Payer: Self-pay | Admitting: Gastroenterology

## 2023-06-13 ENCOUNTER — Telehealth: Payer: 59 | Admitting: Adult Health

## 2023-06-13 ENCOUNTER — Ambulatory Visit: Payer: 59 | Admitting: Adult Health

## 2023-06-13 NOTE — Telephone Encounter (Signed)
PA has been submitted via [email or web address]... Awaiting response 

## 2023-06-14 NOTE — Telephone Encounter (Signed)
 The request for coverage for VOQUEZNA TAB 10MG ,use as directed (30permonth), is denied.This decision is based on health plan criteria for VOQUEZNA TAB 10MG .  This medicine is covered only if:   One of thefollowing:   (1)Themedicationisforthetreatmentormaintenanceoferosiveesophagitis. (2)ThemedicationisforthetreatmentofHelicobacterpylori(H.pylori)incombinationwithamoxicillin or amoxicillin and clarithromycin.

## 2023-06-18 ENCOUNTER — Ambulatory Visit: Payer: Self-pay | Admitting: Internal Medicine

## 2023-06-18 NOTE — Telephone Encounter (Signed)
 Copied from CRM [id number]. Topic: Clinical - Red Word Triage >> Jun 18, 2023  4:08 PM Shelah Lewandowsky wrote: Red Word that prompted transfer to Nurse Triage: sharp pain on right side, had some vomiting this morning   Chief Complaint: Abdominal pain  Symptoms: Abdominal pain, constipation   Frequency: Intermittent (no pain in certain positions but pain returns when moving or laying certain ways) Pertinent Negatives: Patient denies vomiting, fever, diarrhea  Disposition: [] ED /[] Urgent Care (no appt availability in office) / [x] Appointment(In office/virtual)/ []  [facility]/ [] Home Care/ [] Refused Recommended Disposition /[] [facility]/ []  Follow-up with PCP Additional Notes: Patient reports she has had right sided abdominal pain for a little over a week. She states her pain has been mostly constant but that it goes away depending on how she is laying down. She states she has also had some constipation recently. She denies any fever, vomiting, or diarrhea. Appointment made for the patient tomorrow for evaluation.     Reason for Disposition  [1] MODERATE pain (e.g., interferes with normal activities) AND [2] pain comes and goes (cramps) AND [3] present > 24 hours  (Exception: Pain with Vomiting or Diarrhea - see that Guideline.)  Answer Assessment - Initial Assessment Questions 1. LOCATION: "Where does it hurt?"      Right lower abdomen  2. RADIATION: "Does the pain shoot anywhere else?" (e.g., chest, back)     No 3. ONSET: "When did the pain begin?" (e.g., minutes, hours or days ago)      2 weeks 4. SUDDEN: "Gradual or sudden onset?"     Gradual  5. PATTERN "Does the pain come and go, or is it constant?"    - If it comes and goes: "How long does it last?" "Do you have pain now?"     Constant  6. SEVERITY: "How bad is the pain?"  (e.g., Scale 1-10; mild, moderate, or severe)    - MILD (1-3): Doesn't interfere with normal activities, abdomen soft and not tender to touch.      - MODERATE (4-7): Interferes with normal activities or awakens from sleep, abdomen tender to touch.     - SEVERE (8-10): Excruciating pain, doubled over, unable to do any normal activities.       Moderate to severe 7. RECURRENT SYMPTOM: "Have you ever had this type of stomach pain before?" If Yes, ask: "When was the last time?" and "What happened that time?"      No 8. CAUSE: "What do you think is causing the stomach pain?"     Unsure 9. RELIEVING/AGGRAVATING FACTORS: "What makes it better or worse?" (e.g., antacids, bending or twisting motion, bowel movement)     Laying in certain positions  10. OTHER SYMPTOMS: "Do you have any other symptoms?" (e.g., back pain, diarrhea, fever, urination pain, vomiting)       No  Protocols used: Abdominal Pain - Female-A-AH

## 2023-06-19 ENCOUNTER — Other Ambulatory Visit: Payer: Self-pay

## 2023-06-19 ENCOUNTER — Ambulatory Visit: Admitting: Internal Medicine

## 2023-06-19 ENCOUNTER — Encounter: Payer: Self-pay | Admitting: Internal Medicine

## 2023-06-19 ENCOUNTER — Other Ambulatory Visit: Payer: Self-pay | Admitting: Adult Health

## 2023-06-19 ENCOUNTER — Ambulatory Visit
Admission: RE | Admit: 2023-06-19 | Discharge: 2023-06-19 | Disposition: A | Discharge status: Home / Self Care | Source: Ambulatory Visit | Attending: Internal Medicine | Admitting: Internal Medicine

## 2023-06-19 ENCOUNTER — Observation Stay
Admission: EM | Admit: 2023-06-19 | Discharge: 2023-06-21 | Disposition: A | Discharge status: Home / Self Care | Attending: Surgery | Admitting: Surgery

## 2023-06-19 VITALS — BP 132/80 | HR 83 | Temp 97.9°F | Resp 18 | Ht 68.0 in | Wt 212.8 lb

## 2023-06-19 DIAGNOSIS — Z79899 Other long term (current) drug therapy: Secondary | ICD-10-CM | POA: Insufficient documentation

## 2023-06-19 DIAGNOSIS — J45909 Unspecified asthma, uncomplicated: Secondary | ICD-10-CM | POA: Insufficient documentation

## 2023-06-19 DIAGNOSIS — R1031 Right lower quadrant pain: Secondary | ICD-10-CM | POA: Insufficient documentation

## 2023-06-19 DIAGNOSIS — K3532 Acute appendicitis with perforation and localized peritonitis, without abscess: Principal | ICD-10-CM | POA: Diagnosis present

## 2023-06-19 DIAGNOSIS — I10 Essential (primary) hypertension: Secondary | ICD-10-CM | POA: Diagnosis not present

## 2023-06-19 DIAGNOSIS — F3130 Bipolar disorder, current episode depressed, mild or moderate severity, unspecified: Secondary | ICD-10-CM

## 2023-06-19 DIAGNOSIS — G4700 Insomnia, unspecified: Secondary | ICD-10-CM

## 2023-06-19 LAB — COMPREHENSIVE METABOLIC PANEL
ALT: 22 U/L (ref 0–44)
AST: 14 U/L — ABNORMAL LOW (ref 15–41)
Albumin: 4.1 g/dL (ref 3.5–5.0)
Alkaline Phosphatase: 52 U/L (ref 38–126)
Anion gap: 12 (ref 5–15)
BUN: 11 mg/dL (ref 6–20)
CO2: 22 mmol/L (ref 22–32)
Calcium: 9.3 mg/dL (ref 8.9–10.3)
Chloride: 100 mmol/L (ref 98–111)
Creatinine, Ser: 0.98 mg/dL (ref 0.44–1.00)
GFR, Estimated: >60 mL/min (ref >60)
Glucose, Bld: 110 mg/dL — ABNORMAL HIGH (ref 70–99)
Potassium: 3.4 mmol/L — ABNORMAL LOW (ref 3.5–5.1)
Sodium: 134 mmol/L — ABNORMAL LOW (ref 135–145)
Total Bilirubin: 1 mg/dL (ref 0.0–1.2)
Total Protein: 8.1 g/dL (ref 6.5–8.1)

## 2023-06-19 LAB — POC URINE PREG, ED: Preg Test, Ur: NEGATIVE

## 2023-06-19 LAB — CBC
HCT: 39.9 % (ref 36.0–46.0)
Hemoglobin: 13.8 g/dL (ref 12.0–15.0)
MCH: 29.6 pg (ref 26.0–34.0)
MCHC: 34.6 g/dL (ref 30.0–36.0)
MCV: 85.6 fL (ref 80.0–100.0)
Platelets: 470 10*3/uL — ABNORMAL HIGH (ref 150–400)
RBC: 4.66 MIL/uL (ref 3.87–5.11)
RDW: 12.2 % (ref 11.5–15.5)
WBC: 13.3 10*3/uL — ABNORMAL HIGH (ref 4.0–10.5)
nRBC: 0 % (ref 0.0–0.2)

## 2023-06-19 LAB — URINALYSIS, ROUTINE W REFLEX MICROSCOPIC
Bilirubin Urine: NEGATIVE
Glucose, UA: NEGATIVE mg/dL
Ketones, ur: NEGATIVE mg/dL
Nitrite: NEGATIVE
Protein, ur: NEGATIVE mg/dL
Specific Gravity, Urine: >1.046 — ABNORMAL HIGH (ref 1.005–1.030)
pH: 6 (ref 5.0–8.0)

## 2023-06-19 LAB — LIPASE, BLOOD: Lipase: 34 U/L (ref 11–51)

## 2023-06-19 LAB — TYPE AND SCREEN
ABO/RH(D): A POS
Antibody Screen: NEGATIVE

## 2023-06-19 LAB — LACTIC ACID, PLASMA: Lactic Acid, Venous: 1 mmol/L (ref 0.5–1.9)

## 2023-06-19 MED ORDER — NON FORMULARY: 10.0000 mg | Status: DC | PRN

## 2023-06-19 MED ORDER — AMLODIPINE BESYLATE 10 MG PO TABS: 10.0000 mg | ORAL_TABLET | Freq: Every evening | ORAL | Status: DC

## 2023-06-19 MED ORDER — ONDANSETRON 4 MG PO TBDP: 4.0000 mg | ORAL_TABLET | Freq: Four times a day (QID) | ORAL | Status: DC | PRN

## 2023-06-19 MED ORDER — LURASIDONE HCL 40 MG PO TABS
40.0000 mg | ORAL_TABLET | Freq: Every evening | ORAL | Status: DC
  Administered 2023-06-19 – 2023-06-20 (×2): 40 mg via ORAL
  Filled 2023-06-19 (×3): qty 1

## 2023-06-19 MED ORDER — LACTATED RINGERS IV BOLUS
1000.0000 mL | Freq: Once | INTRAVENOUS | Status: AC
  Administered 2023-06-19: 1000 mL via INTRAVENOUS

## 2023-06-19 MED ORDER — FLUTICASONE PROPIONATE 50 MCG/ACT NA SUSP
2.0000 | Freq: Every day | NASAL | Status: DC | PRN
Start: 2023-06-19 — End: 2023-06-21

## 2023-06-19 MED ORDER — OXYCODONE HCL 5 MG PO TABS
5.0000 mg | ORAL_TABLET | ORAL | Status: DC | PRN
  Administered 2023-06-20 – 2023-06-21 (×4): 10 mg via ORAL
  Filled 2023-06-19 (×4): qty 2

## 2023-06-19 MED ORDER — PIPERACILLIN-TAZOBACTAM 3.375 G IVPB
3.3750 g | Freq: Three times a day (TID) | INTRAVENOUS | Status: DC
  Administered 2023-06-19 – 2023-06-21 (×5): 3.375 g via INTRAVENOUS
  Filled 2023-06-19 (×4): qty 50

## 2023-06-19 MED ORDER — LEVALBUTEROL HCL 1.25 MG/0.5ML IN NEBU: 1.2500 mg | INHALATION_SOLUTION | Freq: Three times a day (TID) | RESPIRATORY_TRACT | Status: DC | PRN

## 2023-06-19 MED ORDER — HYDROMORPHONE HCL 1 MG/ML IJ SOLN
1.0000 mg | INTRAMUSCULAR | Status: DC | PRN
  Filled 2023-06-19 (×2): qty 1

## 2023-06-19 MED ORDER — ZOLPIDEM TARTRATE 5 MG PO TABS: 5.0000 mg | ORAL_TABLET | Freq: Every evening | ORAL | Status: DC | PRN

## 2023-06-19 MED ORDER — PANTOPRAZOLE SODIUM 40 MG IV SOLR
40.0000 mg | Freq: Two times a day (BID) | INTRAVENOUS | Status: DC
  Administered 2023-06-19 – 2023-06-21 (×4): 40 mg via INTRAVENOUS
  Filled 2023-06-19 (×4): qty 10

## 2023-06-19 MED ORDER — METHOCARBAMOL 500 MG PO TABS: 500.0000 mg | ORAL_TABLET | Freq: Three times a day (TID) | ORAL | Status: DC | PRN

## 2023-06-19 MED ORDER — IOHEXOL 300 MG/ML  SOLN
100.0000 mL | Freq: Once | INTRAMUSCULAR | Status: AC | PRN
  Administered 2023-06-19: 100 mL via INTRAVENOUS

## 2023-06-19 MED ORDER — PROCHLORPERAZINE MALEATE 10 MG PO TABS: 10.0000 mg | ORAL_TABLET | Freq: Four times a day (QID) | ORAL | Status: DC | PRN

## 2023-06-19 MED ORDER — PROCHLORPERAZINE EDISYLATE 10 MG/2ML IJ SOLN
5.0000 mg | Freq: Four times a day (QID) | INTRAMUSCULAR | Status: DC | PRN
  Administered 2023-06-19: 10 mg via INTRAVENOUS
  Filled 2023-06-19: qty 2

## 2023-06-19 MED ORDER — DIPHENHYDRAMINE HCL 12.5 MG/5ML PO ELIX: 12.5000 mg | ORAL_SOLUTION | Freq: Four times a day (QID) | ORAL | Status: DC | PRN

## 2023-06-19 MED ORDER — ACETAMINOPHEN 500 MG PO TABS
1000.0000 mg | ORAL_TABLET | Freq: Four times a day (QID) | ORAL | Status: DC
  Administered 2023-06-20 – 2023-06-21 (×4): 1000 mg via ORAL
  Filled 2023-06-19 (×5): qty 2

## 2023-06-19 MED ORDER — METHOCARBAMOL 1000 MG/10ML IJ SOLN: 500.0000 mg | Freq: Three times a day (TID) | INTRAMUSCULAR | Status: DC | PRN

## 2023-06-19 MED ORDER — DIPHENHYDRAMINE HCL 50 MG/ML IJ SOLN: 12.5000 mg | Freq: Four times a day (QID) | INTRAMUSCULAR | Status: DC | PRN

## 2023-06-19 MED ORDER — SODIUM CHLORIDE 0.9 % IV SOLN: INTRAVENOUS | Status: DC

## 2023-06-19 MED ORDER — HYDROMORPHONE HCL 1 MG/ML IJ SOLN
1.0000 mg | Freq: Once | INTRAMUSCULAR | Status: AC
  Administered 2023-06-19: 1 mg via INTRAVENOUS
  Filled 2023-06-19: qty 1

## 2023-06-19 MED ORDER — ONDANSETRON HCL 4 MG/2ML IJ SOLN
4.0000 mg | Freq: Once | INTRAMUSCULAR | Status: AC
  Administered 2023-06-19: 4 mg via INTRAVENOUS
  Filled 2023-06-19: qty 2

## 2023-06-19 MED ORDER — HYDRALAZINE HCL 20 MG/ML IJ SOLN: 10.0000 mg | INTRAMUSCULAR | Status: DC | PRN

## 2023-06-19 MED ORDER — SODIUM CHLORIDE 0.9 % IV SOLN
2.0000 g | Freq: Once | INTRAVENOUS | Status: AC
  Administered 2023-06-19: 2 g via INTRAVENOUS
  Filled 2023-06-19: qty 20

## 2023-06-19 MED ORDER — ONDANSETRON HCL 4 MG/2ML IJ SOLN
4.0000 mg | Freq: Four times a day (QID) | INTRAMUSCULAR | Status: DC | PRN
  Administered 2023-06-19 – 2023-06-20 (×3): 4 mg via INTRAVENOUS
  Filled 2023-06-19 (×2): qty 2

## 2023-06-19 MED ORDER — NORTRIPTYLINE HCL 25 MG PO CAPS
50.0000 mg | ORAL_CAPSULE | Freq: Every day | ORAL | Status: DC
Start: 2023-06-19 — End: 2023-06-21
  Administered 2023-06-19: 50 mg via ORAL
  Filled 2023-06-19 (×2): qty 2

## 2023-06-19 MED ORDER — ENOXAPARIN SODIUM 60 MG/0.6ML IJ SOSY
45.0000 mg | PREFILLED_SYRINGE | INTRAMUSCULAR | Status: DC
  Administered 2023-06-19 – 2023-06-20 (×2): 45 mg via SUBCUTANEOUS
  Filled 2023-06-19 (×2): qty 0.6

## 2023-06-19 NOTE — Progress Notes (Signed)
 48 yo female presents w 10 hx of abd pain CT pers reviewed c/w contained perf. Appendicitis. Recently discontinued GLP-1 analog, She is not toxic nor peritonitic. We will admit, start A/bs, resuscitate her and tentatively perform lap appy in am.

## 2023-06-19 NOTE — ED Provider Notes (Signed)
 [facility] Provider Note    Event Date/Time   First MD Initiated Contact with Patient 06/19/23 1747     (approximate)   History   Abdominal Pain   HPI  Anita Weaver is a 48 y.o. female with history of migraine, asthma, endometriosis, hyperlipidemia, hypertension, and GERD who presents with right lower quadrant abdominal pain for approximately last 1.5 weeks, gradual onset, worsening course, and acutely worsened today.  The patient had an outpatient CT obtained which shows perforated appendicitis.  She denies any nausea or vomiting.  She states that she has constipated and does not remember her last bowel movement.  She has no fever or chills.  I reviewed the past medical records.  The most recent documented outpatient encounter is with internal medicine on 2/10 for follow-up after she was started on Zepbound for weight loss.   Physical Exam   Triage Vital Signs: ED Triage Vitals [06/19/23 1744]  Encounter Vitals Group     BP (!) 162/95     Systolic BP Percentile      Diastolic BP Percentile      Pulse Rate (!) 127     Resp 20     Temp 98.6 F (37 C)     Temp Source Oral     SpO2 98 %     Weight      Height      Head Circumference      Peak Flow      Pain Score 4     Pain Loc      Pain Education      Exclude from Growth Chart     Most recent vital signs: Vitals:   06/19/23 1832 06/19/23 1840  BP: 139/83   Pulse:  (!) 118  Resp:    Temp:    SpO2:  98%     General: Alert, well-appearing, no distress.  CV:  Good peripheral perfusion.  Resp:  Normal effort.  Abd:  Mild right lower quadrant tenderness.  No distention.  No peritoneal signs. Other:  No jaundice or scleral icterus.   ED Results / Procedures / Treatments   Labs (all labs ordered are listed, but only abnormal results are displayed) Labs Reviewed  COMPREHENSIVE METABOLIC PANEL - Abnormal; Notable for the following components:      Result Value   Sodium 134 (*)     Potassium 3.4 (*)    Glucose, Bld 110 (*)    AST 14 (*)    All other components within normal limits  CBC - Abnormal; Notable for the following components:   WBC 13.3 (*)    Platelets 470 (*)    All other components within normal limits  LIPASE, BLOOD  URINALYSIS, ROUTINE W REFLEX MICROSCOPIC  LACTIC ACID, PLASMA  LACTIC ACID, PLASMA  POC URINE PREG, ED  TYPE AND SCREEN     EKG     RADIOLOGY  CT abdomen/pelvis:  IMPRESSION: 1. Findings described above, compatible with acute perforated appendicitis. No appendicolith. No pneumoperitoneum. 2. Multiple other nonacute observations, as described above.  PROCEDURES:  Critical Care performed: No  Procedures   MEDICATIONS ORDERED IN ED: Medications  cefTRIAXone (ROCEPHIN) 2 g in sodium chloride 0.9 % 100 mL IVPB (2 g Intravenous New Bag/Given 06/19/23 1849)  lactated ringers bolus 1,000 mL (has no administration in time range)  HYDROmorphone (DILAUDID) injection 1 mg (1 mg Intravenous Given 06/19/23 1847)  ondansetron (ZOFRAN) injection 4 mg (4 mg Intravenous Given 06/19/23 1841)  IMPRESSION / MDM / ASSESSMENT AND PLAN / ED COURSE  I reviewed the triage vital signs and the nursing notes.  48 year old female with PMH as noted above presents that is gradually worsened over the last 1.5 weeks.  Today she had an outpatient CT showing perforated appendicitis.  On exam her tensive and tachycardic.  Other vital signs are normal.  She has some tenderness in the right lower quadrant but no peritoneal signs.  Differential diagnosis includes, but is not limited to, acute perforated appendicitis.  Patient's presentation is most consistent with acute presentation with potential threat to life or bodily function.  We will obtain lab workup, give fluids, empiric antibiotics, and consult general surgery.  ----------------------------------------- 6:53 PM on 06/19/2023 -----------------------------------------  CBC shows mild  leukocytosis.  CMP is unremarkable.  Lactate is pending.  I consulted and discussed case with Dr. Everlene Farrier from general surgery who has admitted the patient with a plan for laparoscopic appendectomy tomorrow.  I counseled the patient on the plan of care and she is in agreement.   FINAL CLINICAL IMPRESSION(S) / ED DIAGNOSES   Final diagnoses:  Perforated appendicitis     Rx / DC Orders   ED Discharge Orders     None        Note:  This document was prepared using Dragon voice recognition software and may include unintentional dictation errors.    Dionne Bucy, MD 06/19/23 [phone]

## 2023-06-19 NOTE — Progress Notes (Signed)
 PHARMACIST - PHYSICIAN COMMUNICATION  CONCERNING:  Enoxaparin (Lovenox) for DVT Prophylaxis   RECOMMENDATION: Patient was prescribed enoxaprin 40mg  q24 hours for VTE prophylaxis.   There were no vitals filed for this visit.  There is no height or weight on file to calculate BMI.  Estimated Creatinine Clearance: 85.2 mL/min (by C-G formula based on SCr of 0.98 mg/dL).  Based on [facility] policy patient is candidate for enoxaparin 0.5mg /kg TBW SQ every 24 hours based on BMI being >30.  DESCRIPTION: Pharmacy has adjusted enoxaparin dose per [facility] policy.  Patient is now receiving enoxaparin 45 mg every 24 hours   Effie Shy, PharmD Pharmacy Resident  06/19/2023 8:11 PM

## 2023-06-19 NOTE — Progress Notes (Signed)
   Acute Office Visit  Subjective:     Patient ID: Anita Weaver, female    DOB: 1975-07-20, 48 y.o.   MRN: [id number]  Chief Complaint  Patient presents with   Abdominal Pain    Lower abdominal pain right side worst    HPI Patient is in today for abdominal pain x 10 days. Had been on Zepbound but discontinued it 3 weeks ago due to acid reflux.   Abdominal Complaint: -Duration:  10 days -Frequency: constant -Nature: bloating, sharp, dull, and aching -Location: RLQ  -Severity: severe  -Radiation: yes into LLQ -Alleviating factors: Nothing -Aggravating factors: Moving, engaging core, laying down -Treatments attempted: antacids and PPI, Zofran -Constipation: yes, chronic, unchanged -Diarrhea: no -Heartburn:  did while on the Zepbound but no longer -Bloating:yes -Nausea: yes -Vomiting: yes -Episodes of vomit/day: 1 yesterday -Melena or hematochezia: no -Fever: no -Weight loss: yes - had been on Zepbound but stopped taking 3 weeks ago -Change in Appetite: yes   Review of Systems  Constitutional:  Negative for chills and fever.  Gastrointestinal:  Positive for abdominal pain, constipation, nausea and vomiting. Negative for blood in stool, diarrhea, heartburn and melena.  Genitourinary:  Negative for dysuria, frequency, hematuria and urgency.        Objective:    BP 132/80 (Cuff Size: Large)   Pulse 83   Temp 97.9 F (36.6 C) (Oral)   Resp 18   Ht 5\' 8"  (1.727 m)   Wt 212 lb 12.8 oz (96.5 kg)   SpO2 99%   BMI 32.36 kg/m  BP Readings from Last 3 Encounters:  06/19/23 132/80  05/21/23 124/82  04/30/23 126/78   Wt Readings from Last 3 Encounters:  06/19/23 212 lb 12.8 oz (96.5 kg)  05/21/23 214 lb 1.6 oz (97.1 kg)  04/30/23 224 lb (101.6 kg)      Physical Exam Constitutional:      Appearance: Normal appearance.  HENT:     Head: Normocephalic and atraumatic.  Eyes:     Conjunctiva/sclera: Conjunctivae normal.  Cardiovascular:     Rate and Rhythm:  Regular rhythm. Tachycardia present.  Pulmonary:     Effort: Pulmonary effort is normal.     Breath sounds: Normal breath sounds.  Abdominal:     General: Bowel sounds are normal. There is distension.     Palpations: There is no mass.     Tenderness: There is abdominal tenderness. There is guarding. There is no rebound.     Hernia: No hernia is present.     Comments: Localized RLQ tenderness to palpation with guarding  Skin:    General: Skin is warm and dry.  Neurological:     General: No focal deficit present.     Mental Status: She is alert. Mental status is at baseline.  Psychiatric:        Mood and Affect: Mood normal.        Behavior: Behavior normal.     No results found for any visits on 06/19/23.      Assessment & Plan:   1. Right lower quadrant abdominal pain (Primary): Concern for acute appendicitis, will order stat CT.   - CT ABDOMEN PELVIS W CONTRAST; Future   Return if symptoms worsen or fail to improve.  Margarita Mail, DO

## 2023-06-19 NOTE — ED Triage Notes (Signed)
 Pt to ED via CT scan. + for acute appendicitis with perforation. Pt reports RLQ pain x1 wk

## 2023-06-20 ENCOUNTER — Encounter: Payer: Self-pay | Admitting: Surgery

## 2023-06-20 ENCOUNTER — Inpatient Hospital Stay: Admitting: Certified Registered Nurse Anesthetist

## 2023-06-20 ENCOUNTER — Other Ambulatory Visit: Payer: Self-pay

## 2023-06-20 ENCOUNTER — Encounter
Admission: EM | Disposition: A | Discharge status: Home / Self Care | Payer: Self-pay | Source: Home / Self Care | Attending: Emergency Medicine

## 2023-06-20 DIAGNOSIS — K3533 Acute appendicitis with perforation and localized peritonitis, with abscess: Secondary | ICD-10-CM

## 2023-06-20 HISTORY — PX: LAPAROSCOPIC APPENDECTOMY: SHX408

## 2023-06-20 HISTORY — PX: LAPAROSCOPIC LYSIS OF ADHESIONS: SHX5905

## 2023-06-20 LAB — BASIC METABOLIC PANEL
Anion gap: 9 (ref 5–15)
BUN: 8 mg/dL (ref 6–20)
CO2: 23 mmol/L (ref 22–32)
Calcium: 8.5 mg/dL — ABNORMAL LOW (ref 8.9–10.3)
Chloride: 105 mmol/L (ref 98–111)
Creatinine, Ser: 0.75 mg/dL (ref 0.44–1.00)
GFR, Estimated: >60 mL/min (ref >60)
Glucose, Bld: 106 mg/dL — ABNORMAL HIGH (ref 70–99)
Potassium: 3.3 mmol/L — ABNORMAL LOW (ref 3.5–5.1)
Sodium: 137 mmol/L (ref 135–145)

## 2023-06-20 LAB — CBC
HCT: 33 % — ABNORMAL LOW (ref 36.0–46.0)
Hemoglobin: 11.7 g/dL — ABNORMAL LOW (ref 12.0–15.0)
MCH: 30.1 pg (ref 26.0–34.0)
MCHC: 35.5 g/dL (ref 30.0–36.0)
MCV: 84.8 fL (ref 80.0–100.0)
Platelets: 346 10*3/uL (ref 150–400)
RBC: 3.89 MIL/uL (ref 3.87–5.11)
RDW: 12.1 % (ref 11.5–15.5)
WBC: 8.7 10*3/uL (ref 4.0–10.5)
nRBC: 0 % (ref 0.0–0.2)

## 2023-06-20 LAB — HIV ANTIBODY (ROUTINE TESTING W REFLEX): HIV Screen 4th Generation wRfx: NONREACTIVE

## 2023-06-20 SURGERY — APPENDECTOMY, LAPAROSCOPIC
Anesthesia: General

## 2023-06-20 MED ORDER — ROCURONIUM BROMIDE 100 MG/10ML IV SOLN
INTRAVENOUS | Status: DC | PRN
  Administered 2023-06-20: 20 mg via INTRAVENOUS
  Administered 2023-06-20: 30 mg via INTRAVENOUS

## 2023-06-20 MED ORDER — MIDAZOLAM HCL 2 MG/2ML IJ SOLN
INTRAMUSCULAR | Status: AC
  Filled 2023-06-20: qty 2

## 2023-06-20 MED ORDER — FENTANYL CITRATE (PF) 100 MCG/2ML IJ SOLN
INTRAMUSCULAR | Status: AC
  Filled 2023-06-20: qty 2

## 2023-06-20 MED ORDER — METOPROLOL TARTRATE 5 MG/5ML IV SOLN
INTRAVENOUS | Status: DC | PRN
  Administered 2023-06-20 (×2): 2 mg via INTRAVENOUS
  Administered 2023-06-20: 1 mg via INTRAVENOUS

## 2023-06-20 MED ORDER — MORPHINE SULFATE (PF) 2 MG/ML IV SOLN
2.0000 mg | INTRAVENOUS | Status: DC | PRN
  Administered 2023-06-20: 2 mg via INTRAVENOUS

## 2023-06-20 MED ORDER — OXYCODONE HCL 5 MG PO TABS
5.0000 mg | ORAL_TABLET | Freq: Once | ORAL | Status: AC | PRN
  Administered 2023-06-20: 5 mg via ORAL

## 2023-06-20 MED ORDER — SUGAMMADEX SODIUM 200 MG/2ML IV SOLN
INTRAVENOUS | Status: DC | PRN
  Administered 2023-06-20: 200 mg via INTRAVENOUS

## 2023-06-20 MED ORDER — DEXAMETHASONE SODIUM PHOSPHATE 10 MG/ML IJ SOLN
INTRAMUSCULAR | Status: DC | PRN
  Administered 2023-06-20: 10 mg via INTRAVENOUS

## 2023-06-20 MED ORDER — LIDOCAINE HCL (PF) 2 % IJ SOLN
INTRAMUSCULAR | Status: AC
  Filled 2023-06-20: qty 5

## 2023-06-20 MED ORDER — BUPIVACAINE-EPINEPHRINE (PF) 0.25% -1:200000 IJ SOLN
INTRAMUSCULAR | Status: AC
  Filled 2023-06-20: qty 30

## 2023-06-20 MED ORDER — PROPOFOL 10 MG/ML IV BOLUS
INTRAVENOUS | Status: AC
  Filled 2023-06-20: qty 20

## 2023-06-20 MED ORDER — SODIUM CHLORIDE 0.9 % IV SOLN: INTRAVENOUS | Status: AC

## 2023-06-20 MED ORDER — ROCURONIUM BROMIDE 10 MG/ML (PF) SYRINGE
PREFILLED_SYRINGE | INTRAVENOUS | Status: AC
  Filled 2023-06-20: qty 10

## 2023-06-20 MED ORDER — FENTANYL CITRATE (PF) 100 MCG/2ML IJ SOLN
INTRAMUSCULAR | Status: DC | PRN
  Administered 2023-06-20: 100 ug via INTRAVENOUS

## 2023-06-20 MED ORDER — BUPIVACAINE-EPINEPHRINE 0.25% -1:200000 IJ SOLN
INTRAMUSCULAR | Status: DC | PRN
  Administered 2023-06-20: 30 mL

## 2023-06-20 MED ORDER — OXYCODONE HCL 5 MG PO TABS
ORAL_TABLET | ORAL | Status: AC
  Filled 2023-06-20: qty 1

## 2023-06-20 MED ORDER — BUPIVACAINE LIPOSOME 1.3 % IJ SUSP
INTRAMUSCULAR | Status: DC | PRN
  Administered 2023-06-20: 20 mL

## 2023-06-20 MED ORDER — DEXMEDETOMIDINE HCL IN NACL 80 MCG/20ML IV SOLN
INTRAVENOUS | Status: DC | PRN
  Administered 2023-06-20: 12 ug via INTRAVENOUS
  Administered 2023-06-20: 8 ug via INTRAVENOUS

## 2023-06-20 MED ORDER — ONDANSETRON HCL 4 MG/2ML IJ SOLN
INTRAMUSCULAR | Status: AC
  Filled 2023-06-20: qty 2

## 2023-06-20 MED ORDER — SUCCINYLCHOLINE CHLORIDE 200 MG/10ML IV SOSY
PREFILLED_SYRINGE | INTRAVENOUS | Status: AC
  Filled 2023-06-20: qty 10

## 2023-06-20 MED ORDER — OXYCODONE HCL 5 MG/5ML PO SOLN: 5.0000 mg | Freq: Once | ORAL | Status: AC | PRN

## 2023-06-20 MED ORDER — LIDOCAINE HCL (PF) 2 % IJ SOLN
INTRAMUSCULAR | Status: DC | PRN
  Administered 2023-06-20: 100 mg via INTRADERMAL
  Administered 2023-06-20: 60 mg via INTRADERMAL

## 2023-06-20 MED ORDER — SUCCINYLCHOLINE CHLORIDE 200 MG/10ML IV SOSY
PREFILLED_SYRINGE | INTRAVENOUS | Status: DC | PRN
  Administered 2023-06-20: 60 mg via INTRAVENOUS

## 2023-06-20 MED ORDER — MIDAZOLAM HCL 2 MG/2ML IJ SOLN
INTRAMUSCULAR | Status: DC | PRN
  Administered 2023-06-20: 2 mg via INTRAVENOUS

## 2023-06-20 MED ORDER — DEXAMETHASONE SODIUM PHOSPHATE 10 MG/ML IJ SOLN
INTRAMUSCULAR | Status: AC
  Filled 2023-06-20: qty 1

## 2023-06-20 MED ORDER — PREGABALIN 50 MG PO CAPS
100.0000 mg | ORAL_CAPSULE | Freq: Three times a day (TID) | ORAL | Status: DC
  Administered 2023-06-20 – 2023-06-21 (×2): 100 mg via ORAL
  Filled 2023-06-20 (×2): qty 2

## 2023-06-20 MED ORDER — PIPERACILLIN-TAZOBACTAM 3.375 G IVPB
INTRAVENOUS | Status: AC
  Filled 2023-06-20: qty 50

## 2023-06-20 MED ORDER — PROPOFOL 10 MG/ML IV BOLUS
INTRAVENOUS | Status: DC | PRN
  Administered 2023-06-20: 150 mg via INTRAVENOUS

## 2023-06-20 MED ORDER — FENTANYL CITRATE (PF) 100 MCG/2ML IJ SOLN
25.0000 ug | INTRAMUSCULAR | Status: DC | PRN
  Administered 2023-06-20 (×2): 50 ug via INTRAVENOUS
  Administered 2023-06-20 (×2): 25 ug via INTRAVENOUS

## 2023-06-20 SURGICAL SUPPLY — 45 items
APPLIER CLIP 5 13 M/L LIGAMAX5 (MISCELLANEOUS) IMPLANT
BLADE CLIPPER SURG (BLADE) ×2 IMPLANT
CLIP APPLIE 5 13 M/L LIGAMAX5 (MISCELLANEOUS) IMPLANT
CUTTER FLEX LINEAR 45M (STAPLE) ×2 IMPLANT
DERMABOND ADVANCED .7 DNX12 (GAUZE/BANDAGES/DRESSINGS) ×2 IMPLANT
DRAIN CHANNEL 19F RND (DRAIN) IMPLANT
DRSG TEGADERM 4X4.75 (GAUZE/BANDAGES/DRESSINGS) IMPLANT
ELECT CAUTERY BLADE TIP 2.5 (TIP) ×2 IMPLANT
ELECT REM PT RETURN 9FT ADLT (ELECTROSURGICAL) ×2 IMPLANT
ELECTRODE CAUTERY BLDE TIP 2.5 (TIP) ×2 IMPLANT
ELECTRODE REM PT RTRN 9FT ADLT (ELECTROSURGICAL) ×2 IMPLANT
EVACUATOR SILICONE 100CC (DRAIN) IMPLANT
GLOVE BIO SURGEON STRL SZ7 (GLOVE) ×2 IMPLANT
GOWN STRL REUS W/ TWL LRG LVL3 (GOWN DISPOSABLE) ×4 IMPLANT
IRRIGATION STRYKERFLOW (MISCELLANEOUS) ×2 IMPLANT
IRRIGATOR STRYKERFLOW (MISCELLANEOUS) ×2 IMPLANT
IV NS 1000ML BAXH (IV SOLUTION) ×2 IMPLANT
MANIFOLD NEPTUNE II (INSTRUMENTS) ×2 IMPLANT
NDL HYPO 22X1.5 SAFETY MO (MISCELLANEOUS) ×2 IMPLANT
NEEDLE HYPO 22X1.5 SAFETY MO (MISCELLANEOUS) ×2 IMPLANT
NS IRRIG 500ML POUR BTL (IV SOLUTION) ×2 IMPLANT
PACK LAP CHOLECYSTECTOMY (MISCELLANEOUS) ×2 IMPLANT
RELOAD 45 VASCULAR/THIN (ENDOMECHANICALS) IMPLANT
RELOAD STAPLE 45 2.5 WHT GRN (ENDOMECHANICALS) IMPLANT
RELOAD STAPLE 45 3.5 BLU ETS (ENDOMECHANICALS) ×2 IMPLANT
RELOAD STAPLE TA45 3.5 REG BLU (ENDOMECHANICALS) ×2 IMPLANT
SCISSORS METZENBAUM CVD 33 (INSTRUMENTS) IMPLANT
SET TUBE SMOKE EVAC HIGH FLOW (TUBING) ×2 IMPLANT
SHEARS HARMONIC 36 ACE (MISCELLANEOUS) ×2 IMPLANT
SLEEVE Z-THREAD 5X100MM (TROCAR) ×2 IMPLANT
SPONGE DRAIN TRACH 4X4 STRL 2S (GAUZE/BANDAGES/DRESSINGS) IMPLANT
SPONGE T-LAP 18X18 ~~LOC~~+RFID (SPONGE) ×2 IMPLANT
SUT ETHILON 3 0 PS 1 (SUTURE) IMPLANT
SUT MNCRL AB 4-0 PS2 18 (SUTURE) ×2 IMPLANT
SUT VICRYL 0 UR6 27IN ABS (SUTURE) ×4 IMPLANT
SYR 20ML LL LF (SYRINGE) ×2 IMPLANT
SYR 30ML LL (SYRINGE) IMPLANT
SYS BAG RETRIEVAL 10MM (BASKET) ×2 IMPLANT
SYSTEM BAG RETRIEVAL 10MM (BASKET) ×2 IMPLANT
TRAP FLUID SMOKE EVACUATOR (MISCELLANEOUS) ×2 IMPLANT
TRAP SPECIMEN MUCUS 40CC (MISCELLANEOUS) IMPLANT
TRAY FOLEY MTR SLVR 16FR STAT (SET/KITS/TRAYS/PACK) ×2 IMPLANT
TROCAR BALLN 12MMX100 BLUNT (TROCAR) ×2 IMPLANT
TROCAR Z-THREAD FIOS 5X100MM (TROCAR) ×2 IMPLANT
WATER STERILE IRR 500ML POUR (IV SOLUTION) ×2 IMPLANT

## 2023-06-20 NOTE — Anesthesia Preprocedure Evaluation (Signed)
 Anesthesia Evaluation  Patient identified by MRN, date of birth, ID band Patient awake    Reviewed: Allergy & Precautions, NPO status , Patient's Chart, lab work & pertinent test results  Airway Mallampati: III  TM Distance: >3 FB Neck ROM: full    Dental  (+) Dental Advidsory Given, Teeth Intact   Pulmonary asthma , sleep apnea    Pulmonary exam normal        Cardiovascular hypertension, negative cardio ROS Normal cardiovascular exam     Neuro/Psych  PSYCHIATRIC DISORDERS   Bipolar Disorder   negative neurological ROS     GI/Hepatic Neg liver ROS,GERD  Medicated and Poorly Controlled,,  Endo/Other  negative endocrine ROS    Renal/GU      Musculoskeletal   Abdominal   Peds  Hematology negative hematology ROS (+)   Anesthesia Other Findings Past Medical History: No date: Allergy No date: Arthritis No date: Asthma No date: Chronic pelvic pain in female No date: Endometriosis No date: FH: breast cancer in first degree relative     Comment:  Mother and sister both are BRCA positive; patient is               BRCA negative No date: GERD (gastroesophageal reflux disease) No date: Headache     Comment:  migraines No date: Heart murmur No date: Hyperlipidemia No date: Hypertension No date: Interstitial cystitis No date: Irritable bowel disease No date: Neck pain  Past Surgical History: 02/08/2017: BREAST BIOPSY; Left No date: CHOLECYSTECTOMY 04/06/2022: COLONOSCOPY WITH PROPOFOL; N/A     Comment:  Procedure: COLONOSCOPY WITH PROPOFOL;  Surgeon: Midge Minium, MD;  Location: [facility] ENDOSCOPY;  Service:               Endoscopy;  Laterality: N/A; 2006: LAPAROSCOPY No date: TUBAL LIGATION  BMI    Body Mass Index: 32.68 kg/m      Reproductive/Obstetrics negative OB ROS                             Anesthesia Physical Anesthesia Plan  ASA: 2  Anesthesia Plan: General  ETT   Post-op Pain Management:    Induction: Intravenous  PONV Risk Score and Plan: 3 and Ondansetron, Dexamethasone and Midazolam  Airway Management Planned: Oral ETT  Additional Equipment:   Intra-op Plan:   Post-operative Plan: Extubation in OR  Informed Consent: I have reviewed the patients History and Physical, chart, labs and discussed the procedure including the risks, benefits and alternatives for the proposed anesthesia with the patient or authorized representative who has indicated his/her understanding and acceptance.     Dental Advisory Given  Plan Discussed with: Anesthesiologist, CRNA and Surgeon  Anesthesia Plan Comments: (Patient consented for risks of anesthesia including but not limited to:  - adverse reactions to medications - damage to eyes, teeth, lips or other oral mucosa - nerve damage due to positioning  - sore throat or hoarseness - Damage to heart, brain, nerves, lungs, other parts of body or loss of life  Patient voiced understanding and assent.)       Anesthesia Quick Evaluation

## 2023-06-20 NOTE — Anesthesia Postprocedure Evaluation (Signed)
 Anesthesia Post Note  Patient: Anita Weaver  Procedure(s) Performed: APPENDECTOMY, LAPAROSCOPIC LYSIS, ADHESIONS, LAPAROSCOPIC  Patient location during evaluation: PACU Anesthesia Type: General Level of consciousness: awake and alert Pain management: pain level controlled Vital Signs Assessment: post-procedure vital signs reviewed and stable Respiratory status: spontaneous breathing, nonlabored ventilation, respiratory function stable and patient connected to nasal cannula oxygen Cardiovascular status: blood pressure returned to baseline and stable Postop Assessment: no apparent nausea or vomiting Anesthetic complications: no  There were no known notable events for this encounter.   Last Vitals:  Vitals:   06/20/23 1442 06/20/23 1445  BP: 105/78 106/71  Pulse: 92 81  Resp: 19 15  Temp:    SpO2: 95% 93%    Last Pain:  Vitals:   06/20/23 1445  TempSrc:   PainSc: Asleep                 Stephanie Coup

## 2023-06-20 NOTE — Transfer of Care (Signed)
 Immediate Anesthesia Transfer of Care Note  Patient: Anita Weaver  Procedure(s) Performed: APPENDECTOMY, LAPAROSCOPIC LYSIS, ADHESIONS, LAPAROSCOPIC  Patient Location: PACU  Anesthesia Type:General  Level of Consciousness: drowsy and patient cooperative  Airway & Oxygen Therapy: Patient Spontanous Breathing  Post-op Assessment: Report given to RN and Post -op Vital signs reviewed and stable  Post vital signs: stable, pt pain 0/10 and snoring between wake ups  Last Vitals:  Vitals Value Taken Time  BP 94/60 06/20/23 1351  Temp    Pulse 80 06/20/23 1354  Resp 17 06/20/23 1354  SpO2 94 % 06/20/23 1354  Vitals shown include unfiled device data.  Last Pain:  Vitals:   06/20/23 1101  TempSrc: Temporal  PainSc: 3          Complications: No notable events documented.

## 2023-06-20 NOTE — Op Note (Addendum)
 laparascopic appendectomy with drain placement  Maddox R Sult Date of operation:  06/20/2023  Indications: The patient presented with a history of  abdominal pain. Workup has revealed findings consistent withappendicitis.  Pre-operative Diagnosis: Acute appendicitis without mention of peritonitis  Post-operative Diagnosis: Other appendicitis  Surgeon: Sterling Big, MD, FACS Anesthesia: General with endotracheal tube  Findings: Acute gangrenous perforated appendicitis with Phlegmonous changes Right Lower quadrant and periappendiceal asbcess  Estimated Blood Loss: 10cc         Specimens: appendix         Complications:  none  Procedure Details  The patient was seen again in the preop area. The options of surgery versus observation were reviewed with the patient and/or family. The risks of bleeding, infection, recurrence of symptoms, negative laparoscopy, potential for an open procedure, bowel injury, abscess or infection, were all reviewed as well. The patient was taken to Operating Room, identified as Martita R Rebello and the procedure verified as laparoscopic appendectomy. A Time Out was held and the above information confirmed.  The patient was placed in the supine position and general anesthesia was induced.  Antibiotic prophylaxis was administered and VT E prophylaxis was in place. The abdomen was prepped and draped in a sterile fashion. An infraumbilical incision was made. A cutdown technique was used to enter the abdominal cavity. Two vicryl stitches were placed on the fascia and a Hasson trocar inserted. Pneumoperitoneum obtained. Two 5 mm ports were placed under direct visualization. Inspection revealed a large phlegmon with in the RLQ, dense adhesion were encounter from the cecum to the pelvic wall  Meticulous dissection performed with suction device, THe White line of Toldt was excised to mobilize the colon to allow a good window of dissection. Please note that this case was difficult  due to inflammatory process, The appendix was wrapped around the veil of Treveis, I was able to use suction to break down loculations. I was able to finally identified the appendix and there was an abscess that was drained about 5 cc was drained and cultured. Irrigation was done as well.  Please note that adhesions and loculation were extensive requiring significant additional time that were time consuming. After restoring the anatomy,  The appendix was carefully dissected. The mesoappendix was divided withHarmonic scalpel. The base of the appendix was dissected out and divided with a standard load Endo GIA.The appendix was placed in a Endo Catch bag and removed via the Hasson port. The right lower quadrant and pelvis was then irrigated with  normal saline which was aspirated. Inspection  failed to identify any additional bleeding and there were no signs of bowel injury. Again the right lower quadrant was inspected there was no sign of bleeding or bowel injury therefore pneumoperitoneum was released, all ports were removed. 19 blake drain placed RLQ under direct visualization and secured to the abd wall Exparel was infiltrated. The umbilical fascia was closed with 0 Vicryl interrupted sutures and the skin incisions were approximated with subcuticular 4-0 Monocryl. Dermabond was placed The patient tolerated the procedure well, there were no complications. The sponge lap and needle count were correct at the end of the procedure.  The patient was taken to the recovery room in stable condition to be admitted for continued care.    Sterling Big, MD FACS

## 2023-06-20 NOTE — H&P (Signed)
 [facility] SURGICAL HISTORY & PHYSICAL (cpt [phone])  HISTORY OF PRESENT ILLNESS (HPI):  48 y.o. female presented to [facility] ED yesterday for abdominal pain. Patient reports around a 1.5 week history of RLQ abdominal pain. This has been progressively worsening. No fever, chills, nausea, emesis, urinary changes, nor bowel changes. She did initially get CT abdomen/Pelvis with her PCP as an outpatient which was concerning for perforated appendicitis. As such, she was referred to the ED. Work up in the ED revealed a leukocytosis to 13.3K, Hgb to 13.8, sCr - 0.98, hypokalemia to 3.4, venous lactate normal at 1.0. Currently on Zosyn.   General surgery is consulted by emergency medicine physician Dr Dionne Bucy, MD for evaluation and management of acute appendicitis.   PAST MEDICAL HISTORY (PMH):  Past Medical History:  Diagnosis Date   Allergy    Arthritis    Asthma    Chronic pelvic pain in female    Endometriosis    FH: breast cancer in first degree relative    Mother and sister both are BRCA positive; patient is BRCA negative   GERD (gastroesophageal reflux disease)    Headache    migraines   Heart murmur    Hyperlipidemia    Hypertension    Interstitial cystitis    Irritable bowel disease    Neck pain     Reviewed. Otherwise negative.   PAST SURGICAL HISTORY [facility]):  Past Surgical History:  Procedure Laterality Date   BREAST BIOPSY Left 02/08/2017   CHOLECYSTECTOMY     COLONOSCOPY WITH PROPOFOL N/A 04/06/2022   Procedure: COLONOSCOPY WITH PROPOFOL;  Surgeon: Midge Minium, MD;  Location: [facility] ENDOSCOPY;  Service: Endoscopy;  Laterality: N/A;   LAPAROSCOPY  2006   TUBAL LIGATION      Reviewed. Otherwise negative.   MEDICATIONS:  Prior to Admission medications   Medication Sig Start Date End Date Taking? Authorizing Provider  amLODipine (NORVASC) 10 MG tablet TAKE 1 TABLET BY MOUTH EVERY DAY 04/23/23  Yes Margarita Mail, DO  famotidine (PEPCID) 40 MG  tablet Take 40 mg by mouth daily.   Yes [provider]  levalbuterol (XOPENEX HFA) 45 MCG/ACT inhaler INHALE 2 PUFFS INTO THE LUNGS EVERY 8 (EIGHT) HOURS AS NEEDED FOR WHEEZING OR SHORTNESS OF BREATH. 07/24/22 07/24/23 Yes Berniece Salines, FNP  losartan (COZAAR) 50 MG tablet TAKE 1 TABLET BY MOUTH EVERY DAY 04/23/23  Yes Margarita Mail, DO  nortriptyline (PAMELOR) 10 MG capsule Take 40 mg by mouth at bedtime. 10/31/21  Yes [provider]  ondansetron (ZOFRAN) 4 MG tablet Take one tablet every 8 hours as needed. #10 to last 30 days. For migraines. 06/04/23  Yes Margarita Mail, DO  rizatriptan (MAXALT) 10 MG tablet Take 1 tablet (10 mg total) by mouth as needed for migraine. May repeat in 2 hours if needed 11/12/19  Yes Jamelle Haring, MD  cholecalciferol (VITAMIN D3) 25 MCG (1000 UNIT) tablet Take 1,000 Units by mouth daily. Patient not taking: Reported on 06/19/2023    [provider]  lurasidone (LATUDA) 40 MG TABS tablet Take 1 tablet by mouth once daily with breakfast 06/20/23   Mozingo, Thereasa Solo, NP  medroxyPROGESTERone (DEPO-PROVERA) 150 MG/ML injection Inject 1 mL (150 mg total) into the muscle every 3 (three) months. 03/20/23   Constant, Peggy, MD  tirzepatide (ZEPBOUND) 2.5 MG/0.5ML Pen Inject 2.5 mg into the skin once a week. Patient not taking: Reported on 06/19/2023 05/21/23   Margarita Mail, DO  Vonoprazan Fumarate (VOQUEZNA) 10 MG  TABS Take 1 tablet by mouth daily. 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  Tobacco Use   Smoking status: Never   Smokeless tobacco: Never  Vaping Use   Vaping status: Never Used  Substance and Sexual Activity   Alcohol use: Yes    Alcohol/week: 0.0 standard drinks of alcohol    Comment: occasionally   Drug use: No   Sexual activity: Yes    Partners: Male    Birth control/protection: Surgical    Comment: tubaligation  Other Topics Concern   Not on file  Social History Narrative   ** Merged History Encounter **       ** Data from: 02/09/14 Enc Dept: [facility]       ** Data from: 11/10/13 Enc Dept: [facility]   Patient lives at home with her husband Jonny Ruiz)   Patient works full time.   Right handed.   Caffeine- none   Education- high school   Social Drivers of Corporate investment banker Strain: Not on file  Food Insecurity: No Food Insecurity (06/20/2023)   Hunger Vital Sign    Worried About Running Out of Food in the Last Year: Never true    Ran Out of Food in the Last Year: Never true  Transportation Needs: No Transportation Needs (06/20/2023)   PRAPARE - Administrator, Civil Service (Medical): No    Lack of Transportation (Non-Medical): No  Physical Activity: Not on file  Stress: Not on file  Social Connections: Not on file  Intimate Partner Violence: Not At Risk (06/20/2023)   Humiliation, Afraid, Rape, and Kick questionnaire    Fear of Current or Ex-Partner: No    Emotionally Abused: No    Physically Abused: No    Sexually Abused: No     FAMILY HISTORY:  Family History  Problem Relation Age of Onset   Cancer Mother 50       breast   Breast cancer Mother 41   Cancer Sister 16   Breast cancer Sister 74   Kidney disease Son    Heart attack Maternal Grandfather    Heart attack Paternal Grandmother    Heart attack Paternal Grandfather     Otherwise negative.   REVIEW OF SYSTEMS:   Review of Systems  Constitutional:  Negative for chills and fever.  Respiratory:  Negative for cough and shortness of breath.   Cardiovascular:  Negative for chest pain and palpitations.  Gastrointestinal:  Positive for abdominal pain. Negative for constipation, diarrhea, nausea and vomiting.  Genitourinary:  Negative for dysuria and urgency.  All other systems reviewed and are negative.   VITAL SIGNS:  Temp:  [97.5 F (36.4 C)-98.7 F (37.1 C)] 98.7 F (37.1 C) (03/12 0500) Pulse Rate:  [83-127] 97 (03/12 0500) Resp:  [18-21] 20 (03/12 0433) BP: (98-162)/(54-95) 98/54 (03/12 0500) SpO2:  [  94 %-100 %] 98 % (03/12 0500) Weight:  [96.5 kg-97.5 kg] 97.5 kg (03/12 0500)     Height: 5\' 8"  (172.7 cm) Weight: 97.5 kg BMI (Calculated): 32.69   PHYSICAL EXAM:  Physical Exam Vitals and nursing note reviewed. Exam conducted with a chaperone present.  Constitutional:      General: She is not in acute distress.    Appearance: She is well-developed and normal weight. She is not ill-appearing.  HENT:     Head: Normocephalic and atraumatic.  Eyes:     General: No scleral icterus.    Extraocular Movements: Extraocular movements intact.  Cardiovascular:     Rate and Rhythm: Normal rate and regular rhythm.     Heart sounds: Normal heart sounds. No murmur heard. Pulmonary:     Effort: Pulmonary effort is normal. No respiratory distress.     Breath sounds: Normal breath sounds.  Abdominal:     General: Abdomen is flat. There is no distension.     Palpations: Abdomen is soft.     Tenderness: There is abdominal tenderness in the right lower quadrant. There is no guarding or rebound. Positive signs include Rovsing's sign and McBurney's sign.     Comments: Abdomen is soft, marked RLQ tenderness, non-distended, no rebound/guarding   Genitourinary:    Comments: Deferred Skin:    General: Skin is warm and dry.     Coloration: Skin is not jaundiced.     Findings: No erythema.  Neurological:      General: No focal deficit present.     Mental Status: She is alert and oriented to person, place, and time.  Psychiatric:        Mood and Affect: Mood normal.        Behavior: Behavior normal.     INTAKE/OUTPUT:  This shift: No intake/output data recorded.  Last 2 shifts: @IOLAST2SHIFTS @  Labs:     Latest Ref Rng & Units 06/20/2023    5:42 AM 06/19/2023    5:46 PM 04/30/2023    9:27 AM  CBC  WBC 4.0 - 10.5 K/uL 8.7  13.3  9.5   Hemoglobin 12.0 - 15.0 g/dL 21.3  08.6  57.8   Hematocrit 36.0 - 46.0 % 33.0  39.9  49.9   Platelets 150 - 400 K/uL 346  470  416       Latest Ref Rng & Units 06/20/2023    5:42 AM 06/19/2023    5:46 PM 04/30/2023    9:27 AM  CMP  Glucose 70 - 99 mg/dL 469  629  90   BUN 6 - 20 mg/dL 8  11  7    Creatinine 0.44 - 1.00 mg/dL 5.28  4.13  2.44   Sodium 135 - 145 mmol/L 137  134  138   Potassium 3.5 - 5.1 mmol/L 3.3  3.4  4.1   Chloride 98 - 111 mmol/L 105  100  102   CO2 22 - 32 mmol/L 23  22  26    Calcium 8.9 - 10.3 mg/dL 8.5  9.3  9.7   Total Protein 6.5 - 8.1 g/dL  8.1  7.5   Total Bilirubin 0.0 - 1.2 mg/dL  1.0  0.7   Alkaline Phos 38 - 126 U/L  52    AST 15 - 41 U/L  14  13   ALT 0 - 44 U/L  22  15      Imaging studies:   CT Abdomen/Pelvis (06/19/2023) personally reviewed  consistent with appendicitis with  significant inflammatory response, and radiologist report reviewed below; IMPRESSION: 1. Findings described above, compatible with acute perforated appendicitis. No appendicolith. No pneumoperitoneum. 2. Multiple other nonacute observations, as described above.   Assessment/Plan: (ICD-10's: K35.30) 48 y.o. female with acute appendicitis with likely contained perforation without abscess    - Admit to general surgery - Plan for laparoscopic appendectomy today with Dr Everlene Farrier pending OR/Anesthesia availability  - All risks, benefits, and alternatives to above procedure(s) were discussed with the patient, all of her questions were answered to  her expressed satisfaction, patient expresses she wishes to proceed, and informed consent was obtained.   - NPO + IVF support - IV Abx (Zosyn) - Monitor abdominal examination - Pain control prn; antiemetics prn   - Mobilize as tolerated    All of the above findings and recommendations were discussed with the patient, and all of her questions were answered to her expressed satisfaction.  -- Lynden Oxford, PA-C [facility] 06/20/2023, 7:20 AM M-F: 7am - 4pm

## 2023-06-20 NOTE — Plan of Care (Signed)

## 2023-06-20 NOTE — TOC CM/SW Note (Signed)
 Transition of Care [facility]) - Inpatient Brief Assessment   Patient Details  Name: Anita Weaver MRN: [id number] Date of Birth: Mar 13, 1976  Transition of Care [facility]) CM/SW Contact:    Chapman Fitch, RN Phone Number: 06/20/2023, 4:01 PM   Clinical Narrative:   Transition of Care [facility]) Screening Note   Patient Details  Name: Anita Weaver Date of Birth: April 23, 1975   Transition of Care [facility]) CM/SW Contact:    Chapman Fitch, RN Phone Number: 06/20/2023, 4:01 PM    Transition of Care Department [facility]) has reviewed patient and no TOC needs have been identified at this time. If new patient transition needs arise, please place a TOC consult.    Transition of Care Asessment: Insurance and Status: Insurance coverage has been reviewed Patient has primary care physician: Yes     Prior/Current Home Services: No current home services Social Drivers of Health Review: SDOH reviewed no interventions necessary Readmission risk has been reviewed: Yes Transition of care needs: no transition of care needs at this time

## 2023-06-20 NOTE — Plan of Care (Signed)

## 2023-06-20 NOTE — Anesthesia Procedure Notes (Signed)
 Procedure Name: Intubation Date/Time: 06/20/2023 12:40 PM  Performed by: Maryla Morrow., CRNAPre-anesthesia Checklist: Patient identified, Patient being monitored, Timeout performed, Emergency Drugs available and Suction available Patient Re-evaluated:Patient Re-evaluated prior to induction Oxygen Delivery Method: Circle system utilized Preoxygenation: Pre-oxygenation with 100% oxygen Induction Type: IV induction Laryngoscope Size: 3 and McGrath Grade View: Grade I Tube type: Oral Tube size: 6.5 mm Number of attempts: 1 Airway Equipment and Method: Stylet Placement Confirmation: ETT inserted through vocal cords under direct vision, positive ETCO2 and breath sounds checked- equal and bilateral Secured at: 20 cm Tube secured with: Tape Dental Injury: Teeth and Oropharynx as per pre-operative assessment

## 2023-06-20 NOTE — Progress Notes (Signed)
Patient transported to the OR

## 2023-06-20 NOTE — Progress Notes (Signed)
 Patient returned from surgery

## 2023-06-21 ENCOUNTER — Encounter: Payer: Self-pay | Admitting: Surgery

## 2023-06-21 LAB — SURGICAL PATHOLOGY

## 2023-06-21 MED ORDER — OXYCODONE HCL 5 MG PO TABS: 5.0000 mg | ORAL_TABLET | Freq: Four times a day (QID) | ORAL | 0 refills | Status: DC | PRN

## 2023-06-21 MED ORDER — AMOXICILLIN-POT CLAVULANATE 875-125 MG PO TABS: 1.0000 | ORAL_TABLET | Freq: Two times a day (BID) | ORAL | 0 refills | Status: AC

## 2023-06-21 NOTE — Progress Notes (Signed)
 Discharge instructions reviewed with the patient and her husband. Both were shown how to take care of drain. Supplies for drain care given to the patient

## 2023-06-21 NOTE — Discharge Summary (Signed)
 [facility] SURGICAL DISCHARGE SUMMARY  Patient ID: Anita Weaver MRN: [id number] DOB/AGE: 13-Jun-1975 48 y.o.  Admit date: 06/19/2023 Discharge date: 06/21/2023  Discharge Diagnoses Patient Active Problem List   Diagnosis Date Noted   Perforated appendicitis 06/19/2023    Consultants None  Procedures 06/20/2023:  Laparoscopic Appendectomy   HPI: 48 y.o. female presented to [facility] ED yesterday for abdominal pain. Patient reports around a 1.5 week history of RLQ abdominal pain. This has been progressively worsening. No fever, chills, nausea, emesis, urinary changes, nor bowel changes. She did initially get CT abdomen/Pelvis with her PCP as an outpatient which was concerning for perforated appendicitis. As such, she was referred to the ED. Work up in the ED revealed a leukocytosis to 13.3K, Hgb to 13.8, sCr - 0.98, hypokalemia to 3.4, venous lactate normal at 1.0. Currently on Zosyn.   Hospital Course: Informed consent was obtained and documented, and patient underwent uneventful laparoscopic appendectomy (Dr Everlene Farrier, 06/20/2023).  Post-operatively, patient did well with improvement in her symptoms/pain. Advancement of patient's diet and ambulation were well-tolerated. The remainder of patient's hospital course was essentially unremarkable, and discharge planning was initiated accordingly with patient safely able to be discharged home with appropriate discharge instructions, antibiotics (Augmentin x7 days), pain control, and outpatient follow-up after all of her questions were answered to her expressed satisfaction.   Discharge Condition: Good    Physical Examination:  Constitutional: Well appearing female; NAD Pulmonary: Normal effort, no respiratory distress Gastrointestinal: Soft, incisional soreness, non-distended. Drain in suprapubic region; output serous Skin: Laparoscopic incisions are CDI with dermabond; no erythema    Allergies as of 06/21/2023       Reactions    Imitrex [sumatriptan] Other (See Comments)   Heart palpitations, hot/cold sweats   Demerol [meperidine] Other (See Comments)   hallucinations   Flagyl [metronidazole] Hives   Motrin [ibuprofen] Other (See Comments)   swelling        Medication List     TAKE these medications    amLODipine 10 MG tablet Commonly known as: NORVASC TAKE 1 TABLET BY MOUTH EVERY DAY   amoxicillin-clavulanate 875-125 MG tablet Commonly known as: AUGMENTIN Take 1 tablet by mouth 2 (two) times daily for 7 days.   cholecalciferol 25 MCG (1000 UNIT) tablet Commonly known as: VITAMIN D3 Take 1,000 Units by mouth daily.   famotidine 40 MG tablet Commonly known as: PEPCID Take 40 mg by mouth daily.   levalbuterol 45 MCG/ACT inhaler Commonly known as: XOPENEX HFA INHALE 2 PUFFS INTO THE LUNGS EVERY 8 (EIGHT) HOURS AS NEEDED FOR WHEEZING OR SHORTNESS OF BREATH.   losartan 50 MG tablet Commonly known as: COZAAR TAKE 1 TABLET BY MOUTH EVERY DAY   lurasidone 40 MG Tabs tablet Commonly known as: LATUDA Take 1 tablet by mouth once daily with breakfast What changed:  how much to take how to take this when to take this additional instructions   medroxyPROGESTERone 150 MG/ML injection Commonly known as: DEPO-PROVERA Inject 1 mL (150 mg total) into the muscle every 3 (three) months.   nortriptyline 10 MG capsule Commonly known as: PAMELOR Take 40 mg by mouth at bedtime.   ondansetron 4 MG tablet Commonly known as: ZOFRAN Take one tablet every 8 hours as needed. #10 to last 30 days. For migraines.   oxyCODONE 5 MG immediate release tablet Commonly known as: Oxy IR/ROXICODONE Take 1 tablet (5 mg total) by mouth every 6 (six) hours as needed for severe pain (pain score 7-10) or breakthrough pain.  rizatriptan 10 MG tablet Commonly known as: MAXALT Take 1 tablet (10 mg total) by mouth as needed for migraine. May repeat in 2 hours if needed   Voquezna 10 MG Tabs Generic drug: Vonoprazan  Fumarate Take 1 tablet by mouth daily.   Zepbound 2.5 MG/0.5ML Pen Generic drug: tirzepatide Inject 2.5 mg into the skin once a week.          Follow-up Information     Donovan Kail, PA-C. Go on 06/27/2023.   Specialty: Physician Assistant Why: Go to appointment on 03/19 at 200 PM Contact information: [address] [place] [address] [phone]                  Time spent on discharge management including discussion of hospital course, clinical condition, outpatient instructions, prescriptions, and follow up with the patient and members of the medical team: >30 minutes  -- Lynden Oxford , PA-C [facility]  06/21/2023, 8:36 AM [phone] M-F: 7am - 4pm

## 2023-06-21 NOTE — Plan of Care (Signed)

## 2023-06-21 NOTE — Plan of Care (Signed)

## 2023-06-21 NOTE — Discharge Instructions (Signed)
 In addition to included general post-operative instructions,  Diet: Resume home diet.   Activity: No heavy lifting >20 pounds (children, pets, laundry, garbage) or strenuous activity for 4 weeks, but light activity and walking are encouraged. Do not drive or drink alcohol if taking narcotic pain medications or having pain that might distract from driving.  Drain: Monitor and record drain output daily at home; handout given. Please bring this with you to your follow up.   Wound care: If you can keep drain site covered/water proof, you may shower/get incision wet with soapy water and pat dry (do not rub incisions), but no baths or submerging incision underwater until follow-up.   Medications: Resume all home medications. For mild to moderate pain: acetaminophen (Tylenol) or ibuprofen/naproxen (if no kidney disease). Combining Tylenol with alcohol can substantially increase your risk of causing liver disease. Narcotic pain medications, if prescribed, can be used for severe pain, though may cause nausea, constipation, and drowsiness. Do not combine Tylenol and Percocet (or similar) within a 6 hour period as Percocet (and similar) contain(s) Tylenol. If you do not need the narcotic pain medication, you do not need to fill the prescription.  Call office [phone] / [phone]) at any time if any questions, worsening pain, fevers/chills, bleeding, drainage from incision site, or other concerns.

## 2023-06-23 LAB — AEROBIC/ANAEROBIC CULTURE W GRAM STAIN (SURGICAL/DEEP WOUND)

## 2023-06-27 ENCOUNTER — Ambulatory Visit (INDEPENDENT_AMBULATORY_CARE_PROVIDER_SITE_OTHER): Payer: Self-pay | Admitting: Physician Assistant

## 2023-06-27 ENCOUNTER — Encounter: Payer: Self-pay | Admitting: Physician Assistant

## 2023-06-27 VITALS — BP 120/84 | HR 112 | Temp 98.0°F | Ht 68.0 in | Wt 205.2 lb

## 2023-06-27 DIAGNOSIS — Z09 Encounter for follow-up examination after completed treatment for conditions other than malignant neoplasm: Secondary | ICD-10-CM

## 2023-06-27 DIAGNOSIS — K3532 Acute appendicitis with perforation and localized peritonitis, without abscess: Secondary | ICD-10-CM

## 2023-06-27 NOTE — Progress Notes (Signed)
 [facility] POST-OP OFFICE VISIT  06/27/2023  HPI: Anita Weaver is a 48 y.o. female s/p laparoscopic appendectomy for acute perforated/gangrenous appendicitis with Dr Everlene Farrier on 03/12  She is overall doing well Abdominal soreness expectedly; some intermittent RLQ spasms No fever, chills, nausea, emesis Bowel function is baseline; tolerating diet  Drain output is serous; <10 ccs daily  Cx grew pan-sensitive E coli; on Augmentin Ambulating well No other complaints   Vital signs: BP 120/84   Pulse (!) 112   Temp 98 F (36.7 C) (Oral)   Ht 5\' 8"  (1.727 m)   Wt 205 lb 3.2 oz (93.1 kg)   SpO2 97%   BMI 31.20 kg/m    Physical Exam: Constitutional: Well appearing female, NAD Abdomen: Soft, incisional soreness, non-distended, no rebound/guarding. Surgical drain in suprapubic region; output serous (removed) Skin: Laparoscopic incisions are healing well, healing ecchymosis, no erythema or drainage   Assessment/Plan: This is a 48 y.o. female s/p laparoscopic appendectomy for acute perforated/gangrenous appendicitis with Dr Everlene Farrier on 03/12   - Drain removed without issue; dressing placed  - Pain control prn  - Reviewed wound care recommendation  - Reviewed lifting restrictions; 4 weeks total  - Reviewed surgical pathology; Appendicitis  - She can follow up on as needed basis; She understands to call with questions/concerns  -- Lynden Oxford, PA-C [facility] 06/27/2023, 1:44 PM M-F: 7am - 4pm

## 2023-06-27 NOTE — Patient Instructions (Signed)

## 2023-07-03 ENCOUNTER — Telehealth: Admitting: Adult Health

## 2023-07-03 ENCOUNTER — Encounter: Payer: Self-pay | Admitting: Adult Health

## 2023-07-03 ENCOUNTER — Ambulatory Visit: Admitting: Adult Health

## 2023-07-03 DIAGNOSIS — G4700 Insomnia, unspecified: Secondary | ICD-10-CM | POA: Diagnosis not present

## 2023-07-03 DIAGNOSIS — F3130 Bipolar disorder, current episode depressed, mild or moderate severity, unspecified: Secondary | ICD-10-CM | POA: Diagnosis not present

## 2023-07-03 DIAGNOSIS — F411 Generalized anxiety disorder: Secondary | ICD-10-CM

## 2023-07-03 MED ORDER — LURASIDONE HCL 40 MG PO TABS: 40.0000 mg | ORAL_TABLET | Freq: Every day | ORAL | 5 refills | Status: DC

## 2023-07-03 NOTE — Progress Notes (Signed)
 Anita Weaver [id number] 09-18-1975 48 y.o.  Virtual Visit via Video Note  I connected with pt @ on 07/03/23 at  3:00 PM EDT by a video enabled telemedicine application and verified that I am speaking with the correct person using two identifiers.   I discussed the limitations of evaluation and management by telemedicine and the availability of in person appointments. The patient expressed understanding and agreed to proceed.  I discussed the assessment and treatment plan with the patient. The patient was provided an opportunity to ask questions and all were answered. The patient agreed with the plan and demonstrated an understanding of the instructions.   The patient was advised to call back or seek an in-person evaluation if the symptoms worsen or if the condition fails to improve as anticipated.  I provided 15 minutes of non-face-to-face time during this encounter.  The patient was located at home.  The provider was located at [facility].   Dorothyann Gibbs, NP   Subjective:   Patient ID:  Anita Weaver is a 48 y.o. (DOB 03-22-1976) female.  Chief Complaint: No chief complaint on file.   HPI Anita Weaver presents for follow-up of BPD 1, anxiety and insomnia.  Describes mood today as "ok". Pleasant. Denies tearfulness. Mood symptoms - denies depression, anxiety, and irritability. Reports stable interest ad motivation. Denies panic attacks. Denies worry, rumination, and over thinking. Mood is stable. Stating "I feel like I'm doing ok". Feels like the Latuda at 40mg  daily works well for her. Taking medications as prescribed.  Energy levels lower. Recovering from appendectomy. Active, does not have a regular exercise routine.  Enjoys some usual interests and activities. Married. Lives with husband. Has 3 grown sons - 3 grandchildren - sons. Parents live in [place]. Appetite adequate. Weight loss. Sleeps well most nights. Averages 8 or more hours. Focus and concentration  good. Completing tasks. Managing aspects of household. Works full time - preschool. Denies SI or HI.  Denies AH or VH. Denies self harm. Denies substance use.   Review of Systems:  Review of Systems  Musculoskeletal:  Negative for gait problem.  Neurological:  Negative for tremors.  Psychiatric/Behavioral:         Please refer to HPI    Medications: I have reviewed the patient's current medications.  Current Outpatient Medications  Medication Sig Dispense Refill   amLODipine (NORVASC) 10 MG tablet TAKE 1 TABLET BY MOUTH EVERY DAY 90 tablet 0   cholecalciferol (VITAMIN D3) 25 MCG (1000 UNIT) tablet Take 1,000 Units by mouth daily.     famotidine (PEPCID) 40 MG tablet Take 40 mg by mouth daily.     levalbuterol (XOPENEX HFA) 45 MCG/ACT inhaler INHALE 2 PUFFS INTO THE LUNGS EVERY 8 (EIGHT) HOURS AS NEEDED FOR WHEEZING OR SHORTNESS OF BREATH. 15 each 2   losartan (COZAAR) 50 MG tablet TAKE 1 TABLET BY MOUTH EVERY DAY 90 tablet 0   lurasidone (LATUDA) 40 MG TABS tablet Take 1 tablet (40 mg total) by mouth daily with breakfast. 30 tablet 5   medroxyPROGESTERone (DEPO-PROVERA) 150 MG/ML injection Inject 1 mL (150 mg total) into the muscle every 3 (three) months. 1 mL 4   nortriptyline (PAMELOR) 10 MG capsule Take 40 mg by mouth at bedtime.     ondansetron (ZOFRAN) 4 MG tablet Take one tablet every 8 hours as needed. #10 to last 30 days. For migraines. 20 tablet 1   oxyCODONE (OXY IR/ROXICODONE) 5 MG immediate release tablet Take 1 tablet (  5 mg total) by mouth every 6 (six) hours as needed for severe pain (pain score 7-10) or breakthrough pain. 20 tablet 0   rizatriptan (MAXALT) 10 MG tablet Take 1 tablet (10 mg total) by mouth as needed for migraine. May repeat in 2 hours if needed 10 tablet 3   No current facility-administered medications for this visit.    Medication Side Effects: None  Allergies:  Allergies  Allergen Reactions   Imitrex [Sumatriptan] Other (See Comments)    Heart  palpitations, hot/cold sweats   Demerol [Meperidine] Other (See Comments)    hallucinations   Flagyl [Metronidazole] Hives   Motrin [Ibuprofen] Other (See Comments)    swelling    Past Medical History:  Diagnosis Date   Allergy    Arthritis    Asthma    Chronic pelvic pain in female    Endometriosis    FH: breast cancer in first degree relative    Mother and sister both are BRCA positive; patient is BRCA negative   GERD (gastroesophageal reflux disease)    Headache    migraines   Heart murmur    Hyperlipidemia    Hypertension    Interstitial cystitis    Irritable bowel disease    Neck pain     Family History  Problem Relation Age of Onset   Cancer Mother 47       breast   Breast cancer Mother 38   Cancer Sister 62   Breast cancer Sister 97   Kidney disease Son    Heart attack Maternal Grandfather    Heart attack Paternal Grandmother    Heart attack Paternal Grandfather     Social History   Socioeconomic History   Marital status: Married    Spouse name: Jonny Ruiz   Number of children: 3   Years of education: 12   Highest education level: Not on file  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  Tobacco Use   Smoking status: Never   Smokeless tobacco: Never  Vaping Use   Vaping status: Never Used  Substance and Sexual Activity   Alcohol use: Yes    Alcohol/week: 0.0 standard drinks of alcohol    Comment: occasionally   Drug use: No   Sexual activity: Yes    Partners: Male    Birth control/protection: Surgical    Comment: tubaligation  Other Topics Concern   Not on file  Social History Narrative   ** Merged History Encounter **       ** Data from: 02/09/14 Enc Dept: [facility]       ** Data from: 11/10/13 Enc Dept: [facility]   Patient lives at home with her husband Jonny Ruiz)   Patient works full time.   Right handed.   Caffeine- none   Education- high school   Social Drivers of Corporate investment banker Strain: Not on file  Food  Insecurity: No Food Insecurity (06/20/2023)   Hunger Vital Sign    Worried About Running Out of Food in the Last Year: Never true    Ran Out of Food in the Last Year: Never true  Transportation Needs: No Transportation Needs (06/20/2023)   PRAPARE - Administrator, Civil Service (Medical): No    Lack of Transportation (Non-Medical): No  Physical Activity: Not on file  Stress: Not on file  Social Connections: Not on file  Intimate Partner Violence: Not At Risk (06/20/2023)   Humiliation, Afraid, Rape, and Kick questionnaire  Fear of Current or Ex-Partner: No    Emotionally Abused: No    Physically Abused: No    Sexually Abused: No    Past Medical History, Surgical history, Social history, and Family history were reviewed and updated as appropriate.   Please see review of systems for further details on the patient's review from today.   Objective:   Physical Exam:  There were no vitals taken for this visit.  Physical Exam Constitutional:      General: She is not in acute distress. Musculoskeletal:        General: No deformity.  Neurological:     Mental Status: She is alert and oriented to person, place, and time.     Coordination: Coordination normal.  Psychiatric:        Attention and Perception: Attention and perception normal. She does not perceive auditory or visual hallucinations.        Mood and Affect: Mood normal. Mood is not anxious or depressed. Affect is not labile, blunt, angry or inappropriate.        Speech: Speech normal.        Behavior: Behavior normal.        Thought Content: Thought content normal. Thought content is not paranoid or delusional. Thought content does not include homicidal or suicidal ideation. Thought content does not include homicidal or suicidal plan.        Cognition and Memory: Cognition and memory normal.        Judgment: Judgment normal.     Comments: Insight intact     Lab Review:     Component Value Date/Time   NA  137 06/20/2023 0542   NA 139 03/03/2013 1545   K 3.3 (L) 06/20/2023 0542   K 3.7 03/03/2013 1545   CL 105 06/20/2023 0542   CL 105 03/03/2013 1545   CO2 23 06/20/2023 0542   CO2 28 03/03/2013 1545   GLUCOSE 106 (H) 06/20/2023 0542   GLUCOSE 106 (H) 03/03/2013 1545   BUN 8 06/20/2023 0542   BUN 11 03/03/2013 1545   CREATININE 0.75 06/20/2023 0542   CREATININE 1.00 (H) 04/30/2023 0927   CALCIUM 8.5 (L) 06/20/2023 0542   CALCIUM 8.8 03/03/2013 1545   PROT 8.1 06/19/2023 1746   PROT 7.3 03/03/2013 1545   ALBUMIN 4.1 06/19/2023 1746   ALBUMIN 3.8 03/03/2013 1545   AST 14 (L) 06/19/2023 1746   AST 8 (L) 03/03/2013 1545   ALT 22 06/19/2023 1746   ALT 15 03/03/2013 1545   ALKPHOS 52 06/19/2023 1746   ALKPHOS 41 (L) 03/03/2013 1545   BILITOT 1.0 06/19/2023 1746   BILITOT 0.4 03/03/2013 1545   GFRNONAA >60 06/20/2023 0542   GFRNONAA 79 05/14/2020 1532   GFRAA 91 05/14/2020 1532       Component Value Date/Time   WBC 8.7 06/20/2023 0542   RBC 3.89 06/20/2023 0542   HGB 11.7 (L) 06/20/2023 0542   HGB 13.8 03/03/2013 1545   HCT 33.0 (L) 06/20/2023 0542   HCT 40.2 03/03/2013 1545   PLT 346 06/20/2023 0542   PLT 288 03/03/2013 1545   MCV 84.8 06/20/2023 0542   MCV 85 03/03/2013 1545   MCH 30.1 06/20/2023 0542   MCHC 35.5 06/20/2023 0542   RDW 12.1 06/20/2023 0542   RDW 13.3 03/03/2013 1545   LYMPHSABS 3,039 04/24/2022 0837   LYMPHSABS 2.9 03/03/2013 1545   MONOABS 752 01/20/2016 1603   MONOABS 0.6 03/03/2013 1545   EOSABS 399 04/30/2023 0927  EOSABS 0.2 03/03/2013 1545   BASOSABS 67 04/30/2023 0927   BASOSABS 0.0 03/03/2013 1545    No results found for: "POCLITH", "LITHIUM"   No results found for: "PHENYTOIN", "PHENOBARB", "VALPROATE", "CBMZ"   .res Assessment: Plan:    Plan:  1. Latuda 40mg  daily   RTC 6 months  15 minutes spent dedicated to the care of this patient on the date of this encounter to include pre-visit review of records, ordering of medication,  post visit documentation, and face-to-face time with the patient discussing BPD 1, anxiety and insomnia. Discussed continuing current medication regimen.  Patient advised to contact office with any questions, adverse effects, or acute worsening in signs and symptoms.  Discussed potential metabolic side effects associated with atypical antipsychotics, as well as potential risk for movement side effects. Advised pt to contact office if movement side effects occur.   Diagnoses and all orders for this visit:  Bipolar I disorder, most recent episode depressed (HCC) -     lurasidone (LATUDA) 40 MG TABS tablet; Take 1 tablet (40 mg total) by mouth daily with breakfast.  Generalized anxiety disorder  Insomnia, unspecified type -     lurasidone (LATUDA) 40 MG TABS tablet; Take 1 tablet (40 mg total) by mouth daily with breakfast.     Please see After Visit Summary for patient specific instructions.  Future Appointments  Date Time Provider Department Center  07/11/2023  3:20 PM [facility]  07/12/2023  2:50 PM [facility] NURSE [facility]  08/17/2023  3:20 PM Margarita Mail, DO [facility]  10/29/2023  3:00 PM Margarita Mail, DO [facility]    No orders of the defined types were placed in this encounter.     -------------------------------

## 2023-07-11 ENCOUNTER — Ambulatory Visit (INDEPENDENT_AMBULATORY_CARE_PROVIDER_SITE_OTHER): Admitting: Surgery

## 2023-07-11 ENCOUNTER — Ambulatory Visit
Admission: RE | Admit: 2023-07-11 | Discharge: 2023-07-11 | Disposition: A | Discharge status: Home / Self Care | Payer: 59 | Source: Ambulatory Visit | Attending: Obstetrics and Gynecology | Admitting: Obstetrics and Gynecology

## 2023-07-11 ENCOUNTER — Encounter: Payer: Self-pay | Admitting: Surgery

## 2023-07-11 VITALS — BP 115/79 | HR 93 | Ht 68.0 in | Wt 211.0 lb

## 2023-07-11 DIAGNOSIS — Z1231 Encounter for screening mammogram for malignant neoplasm of breast: Secondary | ICD-10-CM | POA: Diagnosis present

## 2023-07-11 DIAGNOSIS — G8918 Other acute postprocedural pain: Secondary | ICD-10-CM

## 2023-07-11 DIAGNOSIS — R1031 Right lower quadrant pain: Secondary | ICD-10-CM

## 2023-07-11 NOTE — Patient Instructions (Addendum)
 We will get you scheduled for a CT to look at the area to be sure we are not missing anything.  You may call to schedule this at [phone].  We will have you follow up here in 2 weeks for a recheck.   GENERAL POST-OPERATIVE PATIENT INSTRUCTIONS   WOUND CARE INSTRUCTIONS:  Keep a dry clean dressing on the wound if there is drainage. The initial bandage may be removed after 24 hours.  Once the wound has quit draining you may leave it open to air.  If clothing rubs against the wound or causes irritation and the wound is not draining you may cover it with a dry dressing during the daytime.  Try to keep the wound dry and avoid ointments on the wound unless directed to do so.  If the wound becomes bright red and painful or starts to drain infected material that is not clear, please contact your physician immediately.  If the wound is mildly pink and has a thick firm ridge underneath it, this is normal, and is referred to as a healing ridge.  This will resolve over the next 4-6 weeks.  BATHING: You may shower if you have been informed of this by your surgeon. However, Please do not submerge in a tub, hot tub, or pool until incisions are completely sealed or have been told by your surgeon that you may do so.  DIET:  You may eat any foods that you can tolerate.  It is a good idea to eat a high fiber diet and take in plenty of fluids to prevent constipation.  If you do become constipated you may want to take a mild laxative or take ducolax tablets on a daily basis until your bowel habits are regular.  Constipation can be very uncomfortable, along with straining, after recent surgery.  ACTIVITY:  You are encouraged to cough and deep breath or use your incentive spirometer if you were given one, every 15-30 minutes when awake.  This will help prevent respiratory complications and low grade fevers post-operatively if you had a general anesthetic.  You may want to hug a pillow when coughing and sneezing to add  additional support to the surgical area, if you had abdominal or chest surgery, which will decrease pain during these times.  You are encouraged to walk and engage in light activity for the next two weeks.  You should not lift more than 20 pounds for 6 weeks total after surgery as it could put you at increased risk for complications.  Twenty pounds is roughly equivalent to a plastic bag of groceries. At that time- Listen to your body when lifting, if you have pain when lifting, stop and then try again in a few days. Soreness after doing exercises or activities of daily living is normal as you get back in to your normal routine.  MEDICATIONS:  Try to take narcotic medications and anti-inflammatory medications, such as tylenol, ibuprofen, naprosyn, etc., with food.  This will minimize stomach upset from the medication.  Should you develop nausea and vomiting from the pain medication, or develop a rash, please discontinue the medication and contact your physician.  You should not drive, make important decisions, or operate machinery when taking narcotic pain medication.  SUNBLOCK Use sun block to incision area over the next year if this area will be exposed to sun. This helps decrease scarring and will allow you avoid a permanent darkened area over your incision.  QUESTIONS:  Please feel free to call our  office if you have any questions, and we will be glad to assist you. [phone]

## 2023-07-11 NOTE — Progress Notes (Signed)
 Outpatient Surgical Follow Up  07/11/2023  Anita Weaver is an 48 y.o. female.   Chief Complaint  Patient presents with   Routine Post Op    HPI: RLQ pain after some movement. No N/V, tolerating po. No fevers. Appy 3 1/2 weeks ago, necrotic  Past Medical History:  Diagnosis Date   Allergy    Arthritis    Asthma    Chronic pelvic pain in female    Endometriosis    FH: breast cancer in first degree relative    Mother and sister both are BRCA positive; patient is BRCA negative   GERD (gastroesophageal reflux disease)    Headache    migraines   Heart murmur    Hyperlipidemia    Hypertension    Interstitial cystitis    Irritable bowel disease    Neck pain     Past Surgical History:  Procedure Laterality Date   BREAST BIOPSY Left 02/08/2017   CHOLECYSTECTOMY     COLONOSCOPY WITH PROPOFOL N/A 04/06/2022   Procedure: COLONOSCOPY WITH PROPOFOL;  Surgeon: Midge Minium, MD;  Location: [facility] ENDOSCOPY;  Service: Endoscopy;  Laterality: N/A;   LAPAROSCOPIC APPENDECTOMY N/A 06/20/2023   Procedure: APPENDECTOMY, LAPAROSCOPIC;  Surgeon: Leafy Ro, MD;  Location: [facility] ORS;  Service: General;  Laterality: N/A;   LAPAROSCOPIC LYSIS OF ADHESIONS  06/20/2023   Procedure: LYSIS, ADHESIONS, LAPAROSCOPIC;  Surgeon: Leafy Ro, MD;  Location: [facility] ORS;  Service: General;;   LAPAROSCOPY  2006   TUBAL LIGATION      Family History  Problem Relation Age of Onset   Cancer Mother 49       breast   Breast cancer Mother 10   Cancer Sister 31   Breast cancer Sister 75   Kidney disease Son    Heart attack Maternal Grandfather    Heart attack Paternal Grandmother    Heart attack Paternal Grandfather     Social History:  reports that she has never smoked. She has never been exposed to tobacco smoke. She has never used smokeless tobacco. She reports current alcohol use. She reports that she does not use drugs.  Allergies:  Allergies  Allergen Reactions   Imitrex [Sumatriptan] Other  (See Comments)    Heart palpitations, hot/cold sweats   Demerol [Meperidine] Other (See Comments)    hallucinations   Flagyl [Metronidazole] Hives   Motrin [Ibuprofen] Other (See Comments)    swelling    Medications reviewed.    ROS Full ROS performed and is otherwise negative other than what is stated in HPI   BP 115/79   Pulse 93   Ht 5\' 8"  (1.727 m)   Wt 211 lb (95.7 kg)   SpO2 97%   BMI 32.08 kg/m   Physical Exam   NAd alert Abd: soft, mild TTP RLQ , no peritonitis, wounds healed w/o infection   Assessment/Plan: Lower quadrant abdominal pain.  Uncertain etiology given that she did have a perforated and chronic appendicitis I do think that we need to scan him.  Will perform CT scan of abdomen pelvis we will reevaluate once this is done  Sterling Big, MD [facility] General Surgeon

## 2023-07-12 ENCOUNTER — Ambulatory Visit: Payer: 59 | Admitting: *Deleted

## 2023-07-12 VITALS — BP 133/86 | HR 101

## 2023-07-12 DIAGNOSIS — Z3042 Encounter for surveillance of injectable contraceptive: Secondary | ICD-10-CM | POA: Diagnosis not present

## 2023-07-12 MED ORDER — MEDROXYPROGESTERONE ACETATE 150 MG/ML IM SUSY
150.0000 mg | PREFILLED_SYRINGE | Freq: Once | INTRAMUSCULAR | Status: AC
  Administered 2023-07-12: 150 mg via INTRAMUSCULAR

## 2023-07-12 NOTE — Progress Notes (Signed)
 Last Depo-Provera: 04/25/23. Side Effects if any: None. Serum HCG indicated? NA. Depo-Provera 150 mg IM given by: Scheryl Marten, RN . Next appointment due Jun 19th - July 3rd.

## 2023-07-17 ENCOUNTER — Other Ambulatory Visit: Payer: Self-pay | Admitting: Internal Medicine

## 2023-07-17 DIAGNOSIS — I10 Essential (primary) hypertension: Secondary | ICD-10-CM

## 2023-07-18 NOTE — Telephone Encounter (Signed)
 Requested Prescriptions  Pending Prescriptions Disp Refills   losartan (COZAAR) 50 MG tablet [Pharmacy Med Name: LOSARTAN POTASSIUM 50 MG TAB] 90 tablet 0    Sig: TAKE 1 TABLET BY MOUTH EVERY DAY     Cardiovascular:  Angiotensin Receptor Blockers Failed - 07/18/2023  8:17 AM      Failed - K in normal range and within 180 days    Potassium  Date Value Ref Range Status  06/20/2023 3.3 (L) 3.5 - 5.1 mmol/L Final  03/03/2013 3.7 3.5 - 5.1 mmol/L Final         Passed - Cr in normal range and within 180 days    Creat  Date Value Ref Range Status  04/30/2023 1.00 (H) 0.50 - 0.99 mg/dL Final   Creatinine, Ser  Date Value Ref Range Status  06/20/2023 0.75 0.44 - 1.00 mg/dL Final         Passed - Patient is not pregnant      Passed - Last BP in normal range    BP Readings from Last 1 Encounters:  07/12/23 133/86         Passed - Valid encounter within last 6 months    Recent Outpatient Visits           4 weeks ago Right lower quadrant abdominal pain   [facility] Margarita Mail, DO   1 month ago Obesity (BMI 30.0-34.9)   [facility] Margarita Mail, DO       Future Appointments             In 1 month Margarita Mail, DO [facility], [facility]   In 3 months Margarita Mail, DO [facility], [facility]             amLODipine (NORVASC) 10 MG tablet [Pharmacy Med Name: AMLODIPINE BESYLATE 10 MG TAB] 90 tablet 0    Sig: TAKE 1 TABLET BY MOUTH EVERY DAY     Cardiovascular: Calcium Channel Blockers 2 Passed - 07/18/2023  8:17 AM      Passed - Last BP in normal range    BP Readings from Last 1 Encounters:  07/12/23 133/86         Passed - Last Heart Rate in normal range    Pulse Readings from Last 1 Encounters:  07/12/23 (!) 101         Passed - Valid encounter within last 6 months    Recent Outpatient Visits           4 weeks ago Right lower quadrant abdominal  pain   [facility] Margarita Mail, DO   1 month ago Obesity (BMI 30.0-34.9)   [facility] Margarita Mail, DO       Future Appointments             In 1 month Margarita Mail, DO [facility]   In 3 months Margarita Mail, DO [facility], [facility]

## 2023-07-23 ENCOUNTER — Encounter: Admitting: Surgery

## 2023-08-17 ENCOUNTER — Ambulatory Visit: Payer: 59 | Admitting: Internal Medicine

## 2023-08-17 ENCOUNTER — Other Ambulatory Visit: Payer: Self-pay | Admitting: Neurology

## 2023-08-17 DIAGNOSIS — R519 Headache, unspecified: Secondary | ICD-10-CM

## 2023-08-17 DIAGNOSIS — F40298 Other specified phobia: Secondary | ICD-10-CM

## 2023-08-17 DIAGNOSIS — H53149 Visual discomfort, unspecified: Secondary | ICD-10-CM

## 2023-08-17 DIAGNOSIS — R413 Other amnesia: Secondary | ICD-10-CM

## 2023-08-30 ENCOUNTER — Encounter: Payer: Self-pay | Admitting: Neurology

## 2023-10-01 ENCOUNTER — Ambulatory Visit

## 2023-10-01 DIAGNOSIS — Z3042 Encounter for surveillance of injectable contraceptive: Secondary | ICD-10-CM | POA: Diagnosis not present

## 2023-10-01 MED ORDER — MEDROXYPROGESTERONE ACETATE 150 MG/ML IM SUSY
150.0000 mg | PREFILLED_SYRINGE | Freq: Once | INTRAMUSCULAR | Status: AC
  Administered 2023-10-01: 150 mg via INTRAMUSCULAR

## 2023-10-01 NOTE — Progress Notes (Signed)
  Last Depo-Provera : 07/12/23. Side Effects if any: None . Serum HCG indicated? N/A. Depo-Provera  150 mg IM given by: Andrez Butter CMA . Next appointment due 09/8-09/22.

## 2023-10-03 ENCOUNTER — Other Ambulatory Visit (HOSPITAL_COMMUNITY): Payer: Self-pay

## 2023-10-12 ENCOUNTER — Other Ambulatory Visit: Payer: Self-pay | Admitting: Internal Medicine

## 2023-10-12 DIAGNOSIS — I10 Essential (primary) hypertension: Secondary | ICD-10-CM

## 2023-10-16 NOTE — Telephone Encounter (Signed)
 Appt- 10/29/23 Requested Prescriptions  Pending Prescriptions Disp Refills   amLODipine  (NORVASC ) 10 MG tablet [Pharmacy Med Name: AMLODIPINE  BESYLATE 10 MG TAB] 30 tablet 0    Sig: TAKE 1 TABLET BY MOUTH EVERY DAY     Cardiovascular: Calcium Channel Blockers 2 Passed - 10/16/2023 10:44 AM      Passed - Last BP in normal range    BP Readings from Last 1 Encounters:  07/12/23 133/86         Passed - Last Heart Rate in normal range    Pulse Readings from Last 1 Encounters:  07/12/23 (!) 101         Passed - Valid encounter within last 6 months    Recent Outpatient Visits           3 months ago Right lower quadrant abdominal pain   [facility] Bernardo Fend, DO   4 months ago Obesity (BMI 30.0-34.9)   [facility] Bernardo Fend, DO       Future Appointments             In 1 week Bernardo Fend, DO [facility], [facility]             losartan  (COZAAR ) 50 MG tablet [Pharmacy Med Name: LOSARTAN  POTASSIUM 50 MG TAB] 30 tablet 0    Sig: TAKE 1 TABLET BY MOUTH EVERY DAY     Cardiovascular:  Angiotensin Receptor Blockers Failed - 10/16/2023 10:44 AM      Failed - K in normal range and within 180 days    Potassium  Date Value Ref Range Status  06/20/2023 3.3 (L) 3.5 - 5.1 mmol/L Final  03/03/2013 3.7 3.5 - 5.1 mmol/L Final         Passed - Cr in normal range and within 180 days    Creat  Date Value Ref Range Status  04/30/2023 1.00 (H) 0.50 - 0.99 mg/dL Final   Creatinine, Ser  Date Value Ref Range Status  06/20/2023 0.75 0.44 - 1.00 mg/dL Final         Passed - Patient is not pregnant      Passed - Last BP in normal range    BP Readings from Last 1 Encounters:  07/12/23 133/86         Passed - Valid encounter within last 6 months    Recent Outpatient Visits           3 months ago Right lower quadrant abdominal pain   [facility] Bernardo Fend, DO   4 months ago Obesity (BMI 30.0-34.9)   [facility] Bernardo Fend, DO       Future Appointments             In 1 week Bernardo Fend, DO [facility], [facility]

## 2023-10-29 ENCOUNTER — Ambulatory Visit: Payer: Self-pay | Admitting: Internal Medicine

## 2023-10-29 ENCOUNTER — Other Ambulatory Visit: Payer: Self-pay

## 2023-10-29 ENCOUNTER — Encounter: Payer: Self-pay | Admitting: Internal Medicine

## 2023-10-29 VITALS — BP 130/70 | HR 100 | Temp 98.6°F | Resp 18 | Ht 68.0 in | Wt 222.0 lb

## 2023-10-29 DIAGNOSIS — E782 Mixed hyperlipidemia: Secondary | ICD-10-CM

## 2023-10-29 DIAGNOSIS — E669 Obesity, unspecified: Secondary | ICD-10-CM | POA: Diagnosis not present

## 2023-10-29 DIAGNOSIS — L853 Xerosis cutis: Secondary | ICD-10-CM

## 2023-10-29 DIAGNOSIS — I10 Essential (primary) hypertension: Secondary | ICD-10-CM | POA: Diagnosis not present

## 2023-10-29 MED ORDER — AMLODIPINE BESYLATE 10 MG PO TABS: 10.0000 mg | ORAL_TABLET | Freq: Every day | ORAL | 1 refills | Status: DC

## 2023-10-29 MED ORDER — LOSARTAN POTASSIUM 50 MG PO TABS: 50.0000 mg | ORAL_TABLET | Freq: Every day | ORAL | 1 refills | Status: DC

## 2023-10-29 NOTE — Progress Notes (Signed)
 Established Patient Office Visit  Subjective:  Patient ID: Anita Weaver, female    DOB: Jun 29, 1975  Age: 48 y.o. MRN: [id number]  CC:  Chief Complaint  Patient presents with   Medical Management of Chronic Issues    3 month recheck    HPI Anita Weaver presents for follow up.   Discussed the use of AI scribe software for clinical note transcription with the patient, who gave verbal consent to proceed.  History of Present Illness Anita Weaver is a 48 year old female who presents for a follow-up on her regular health issues.  She has gained weight since her last visit in March, increasing from 212 pounds to 222 pounds. She attributes this to a sugar addiction, which she uses as a stress relief mechanism. She has attempted to limit sugar intake to weekends, which she found helpful. She also mentions live glucosemia, requiring some sugar intake to avoid problems.  She experiences muscle aches in her legs since childhood, initially diagnosed as growing pains, but the condition has worsened over time. Despite x-rays, the cause remains undiagnosed.  She has itching in her right ear for the past month or two, causing soreness due to frequent scratching. She occasionally wakes up with a runny nose but does not frequently experience allergies.  She has a family history of cardiovascular issues and slightly elevated cholesterol levels, which she finds concerning.   Hypertension: -Medications: Amlodipine  10 mg, Losartan  50 mg  -Failed Meds: thought Lisinopril due to cough but ended up being PND -Patient is compliant with above medications and reports no side effects. -Checking BP at home (average): stable -Denies any SOB, vision changes, LE edema or symptoms of hypotension.  -Diagnosed with OSA 2/23 - cannot tolerate CPAP, not currently treating but feels like she is not getting good quality of air when she is laying flat  HLD: -Medications: Nothing currently  -Last lipid panel: Lipid  Panel     Component Value Date/Time   CHOL 204 (H) 04/30/2023 0927   TRIG 183 (H) 04/30/2023 0927   HDL 40 (L) 04/30/2023 0927   CHOLHDL 5.1 (H) 04/30/2023 0927   VLDL 21 05/24/2015 0839   LDLCALC 132 (H) 04/30/2023 0927   The 10-year ASCVD risk score (Arnett DK, et al., 2019) is: 2.3%   Values used to calculate the score:     Age: 33 years     Clincally relevant sex: Female     Is Non-Hispanic African American: No     Diabetic: No     Tobacco smoker: No     Systolic Blood Pressure: 130 mmHg     Is BP treated: Yes     HDL Cholesterol: 40 mg/dL     Total Cholesterol: 204 mg/dL  Migraines: -Maxalt  PRN, Nortriptyline  -Following with Neurology  Asthma/Allergies: -Currently on Albuterol  PRN and Flonase   GERD: -Currently on Pepcid 40 mg  Health Maintenance: -Blood work UTD -Pap in 1/23 -Mammogram 4/25 Birads-1 -Colonoscopy 12/23 - repeat 10 years   Past Medical History:  Diagnosis Date   Allergy    Arthritis    Asthma    Chronic pelvic pain in female    Endometriosis    FH: breast cancer in first degree relative    Mother and sister both are BRCA positive; patient is BRCA negative   GERD (gastroesophageal reflux disease)    Headache    migraines   Heart murmur    Hyperlipidemia    Hypertension    Interstitial cystitis  Irritable bowel disease    Neck pain     Past Surgical History:  Procedure Laterality Date   BREAST BIOPSY Left 02/08/2017   CHOLECYSTECTOMY     COLONOSCOPY WITH PROPOFOL  N/A 04/06/2022   Procedure: COLONOSCOPY WITH PROPOFOL ;  Surgeon: Jinny Carmine, MD;  Location: [facility];  Service: Endoscopy;  Laterality: N/A;   LAPAROSCOPIC APPENDECTOMY N/A 06/20/2023   Procedure: APPENDECTOMY, LAPAROSCOPIC;  Surgeon: Jordis Laneta FALCON, MD;  Location: [facility];  Service: General;  Laterality: N/A;   LAPAROSCOPIC LYSIS OF ADHESIONS  06/20/2023   Procedure: LYSIS, ADHESIONS, LAPAROSCOPIC;  Surgeon: Jordis Laneta FALCON, MD;  Location: [facility];  Service:  General;;   LAPAROSCOPY  2006   TUBAL LIGATION      Family History  Problem Relation Age of Onset   Cancer Mother 31       breast   Breast cancer Mother 26   Cancer Sister 65   Breast cancer Sister 88   Kidney disease Son    Heart attack Maternal Grandfather    Heart attack Paternal Grandmother    Heart attack Paternal Grandfather     Social History   Socioeconomic History   Marital status: Married    Spouse name: Norleen   Number of children: 3   Years of education: 12   Highest education level: Not on file  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  Tobacco Use   Smoking status: Never    Passive exposure: Never   Smokeless tobacco: Never  Vaping Use   Vaping status: Never Used  Substance and Sexual Activity   Alcohol use: Yes    Alcohol/week: 0.0 standard drinks of alcohol    Comment: occasionally   Drug use: No   Sexual activity: Yes    Partners: Male    Birth control/protection: Surgical    Comment: tubaligation  Other Topics Concern   Not on file  Social History Narrative   ** Merged History Encounter **       ** Data from: 02/09/14 Enc Dept: [facility]       ** Data from: 11/10/13 Enc Dept: [facility]   Patient lives at home with her husband Vilinda)   Patient works full time.   Right handed.   Caffeine- none   Education- high school   Social Drivers of Corporate investment banker Strain: Not on file  Food Insecurity: No Food Insecurity (06/20/2023)   Hunger Vital Sign    Worried About Running Out of Food in the Last Year: Never true    Ran Out of Food in the Last Year: Never true  Transportation Needs: No Transportation Needs (06/20/2023)   PRAPARE - Administrator, Civil Service (Medical): No    Lack of Transportation (Non-Medical): No  Physical Activity: Not on file  Stress: Not on file  Social Connections: Not on file  Intimate Partner Violence: Not At Risk (06/20/2023)   Humiliation, Afraid, Rape, and Kick  questionnaire    Fear of Current or Ex-Partner: No    Emotionally Abused: No    Physically Abused: No    Sexually Abused: No    Outpatient Medications Prior to Visit  Medication Sig Dispense Refill   amLODipine  (NORVASC ) 10 MG tablet TAKE 1 TABLET BY MOUTH EVERY DAY 30 tablet 0   famotidine (PEPCID) 40 MG tablet Take 40 mg by mouth daily.     levalbuterol  (XOPENEX  HFA) 45 MCG/ACT inhaler INHALE 2 PUFFS INTO THE LUNGS EVERY 8 (  EIGHT) HOURS AS NEEDED FOR WHEEZING OR SHORTNESS OF BREATH. 15 each 2   losartan  (COZAAR ) 50 MG tablet TAKE 1 TABLET BY MOUTH EVERY DAY 30 tablet 0   lurasidone  (LATUDA ) 40 MG TABS tablet Take 1 tablet (40 mg total) by mouth daily with breakfast. 30 tablet 5   medroxyPROGESTERone  (DEPO-PROVERA ) 150 MG/ML injection Inject 1 mL (150 mg total) into the muscle every 3 (three) months. 1 mL 4   nortriptyline  (PAMELOR ) 10 MG capsule Take 40 mg by mouth at bedtime.     ondansetron  (ZOFRAN ) 4 MG tablet Take one tablet every 8 hours as needed. #10 to last 30 days. For migraines. 20 tablet 1   rizatriptan  (MAXALT ) 10 MG tablet Take 1 tablet (10 mg total) by mouth as needed for migraine. May repeat in 2 hours if needed 10 tablet 3   cholecalciferol (VITAMIN D3) 25 MCG (1000 UNIT) tablet Take 1,000 Units by mouth daily. (Patient not taking: Reported on 10/29/2023)     oxyCODONE  (OXY IR/ROXICODONE ) 5 MG immediate release tablet Take 1 tablet (5 mg total) by mouth every 6 (six) hours as needed for severe pain (pain score 7-10) or breakthrough pain. (Patient not taking: Reported on 10/29/2023) 20 tablet 0   No facility-administered medications prior to visit.    Allergies  Allergen Reactions   Imitrex  [Sumatriptan ] Other (See Comments)    Heart palpitations, hot/cold sweats   Demerol [Meperidine] Other (See Comments)    hallucinations   Flagyl  [Metronidazole ] Hives   Motrin  [Ibuprofen ] Other (See Comments)    swelling    ROS Review of Systems  All other systems reviewed and  are negative.     Objective:    Physical Exam Constitutional:      Appearance: Normal appearance.  HENT:     Head: Normocephalic and atraumatic.     Right Ear: Tympanic membrane and external ear normal.     Left Ear: Tympanic membrane, ear canal and external ear normal.     Ears:     Comments: Dry skin with mild flaking in right ear canal at the entrance  Eyes:     Conjunctiva/sclera: Conjunctivae normal.  Cardiovascular:     Rate and Rhythm: Normal rate and regular rhythm.  Pulmonary:     Effort: Pulmonary effort is normal.     Breath sounds: Normal breath sounds.  Skin:    General: Skin is warm and dry.  Neurological:     General: No focal deficit present.     Mental Status: She is alert. Mental status is at baseline.  Psychiatric:        Mood and Affect: Mood normal.        Behavior: Behavior normal.     BP 130/70 (Cuff Size: Large)   Pulse 100   Temp 98.6 F (37 C)   Resp 18   Ht 5' 8 (1.727 m)   Wt 222 lb (100.7 kg)   SpO2 97%   BMI 33.75 kg/m  Wt Readings from Last 3 Encounters:  10/29/23 222 lb (100.7 kg)  07/11/23 211 lb (95.7 kg)  06/27/23 205 lb 3.2 oz (93.1 kg)     Health Maintenance Due  Topic Date Due   Hepatitis B Vaccines (1 of 3 - 19+ 3-dose series) Never done   Cervical Cancer Screening (HPV/Pap Cotest)  12/06/2019   COVID-19 Vaccine (3 - 2024-25 season) 12/10/2022       Topic Date Due   Hepatitis B Vaccines (1 of 3 - 19+ 3-dose  series) Never done    Lab Results  Component Value Date   TSH 1.44 04/30/2023   Lab Results  Component Value Date   WBC 8.7 06/20/2023   HGB 11.7 (L) 06/20/2023   HCT 33.0 (L) 06/20/2023   MCV 84.8 06/20/2023   PLT 346 06/20/2023   Lab Results  Component Value Date   NA 137 06/20/2023   K 3.3 (L) 06/20/2023   CO2 23 06/20/2023   GLUCOSE 106 (H) 06/20/2023   BUN 8 06/20/2023   CREATININE 0.75 06/20/2023   BILITOT 1.0 06/19/2023   ALKPHOS 52 06/19/2023   AST 14 (L) 06/19/2023   ALT 22  06/19/2023   PROT 8.1 06/19/2023   ALBUMIN 4.1 06/19/2023   CALCIUM 8.5 (L) 06/20/2023   ANIONGAP 9 06/20/2023   EGFR 70 04/30/2023   Lab Results  Component Value Date   CHOL 204 (H) 04/30/2023   Lab Results  Component Value Date   HDL 40 (L) 04/30/2023   Lab Results  Component Value Date   LDLCALC 132 (H) 04/30/2023   Lab Results  Component Value Date   TRIG 183 (H) 04/30/2023   Lab Results  Component Value Date   CHOLHDL 5.1 (H) 04/30/2023   Lab Results  Component Value Date   HGBA1C 4.9 04/30/2023      Assessment & Plan:   Assessment & Plan Obesity Intolerance to previous weight loss medication due to severe side effects. Discussed potential future options with better side effect profiles. - Consider oral medication with shorter half-life in future.  Hyperlipidemia Elevated cholesterol likely due to genetics and diet. Low cardiovascular risk score. No immediate medication needed. Discussed future statin use and alternatives like Repatha.  Hypertension Blood pressure well-controlled on amlodipine  and losartan . - Refill amlodipine  and losartan .  Dry skin in right ear Itching and soreness due to dry skin at ear canal entrance. No infection. - Apply Vaseline to entrance of right ear twice daily for one week.  General Health Maintenance Up to date on screenings. Recent labs normal for thyroid and A1c. - Schedule follow-up in six months for fasting labs. - Advise morning appointment for fasting labs.  - amLODipine  (NORVASC ) 10 MG tablet; Take 1 tablet (10 mg total) by mouth daily.  Dispense: 90 tablet; Refill: 1 - losartan  (COZAAR ) 50 MG tablet; Take 1 tablet (50 mg total) by mouth daily.  Dispense: 90 tablet; Refill: 1   Follow-up: Return in about 6 months (around 04/30/2024).    Sharyle Fischer, DO

## 2023-11-17 ENCOUNTER — Other Ambulatory Visit: Payer: Self-pay | Admitting: Gastroenterology

## 2023-11-17 DIAGNOSIS — R0600 Dyspnea, unspecified: Secondary | ICD-10-CM

## 2023-12-17 ENCOUNTER — Ambulatory Visit

## 2023-12-17 DIAGNOSIS — Z3042 Encounter for surveillance of injectable contraceptive: Secondary | ICD-10-CM | POA: Diagnosis not present

## 2023-12-17 MED ORDER — MEDROXYPROGESTERONE ACETATE 150 MG/ML IM SUSP: 150.0000 mg | INTRAMUSCULAR | 3 refills | Status: AC

## 2023-12-17 MED ORDER — MEDROXYPROGESTERONE ACETATE 150 MG/ML IM SUSY
150.0000 mg | PREFILLED_SYRINGE | Freq: Once | INTRAMUSCULAR | Status: AC
  Administered 2023-12-17: 150 mg via INTRAMUSCULAR

## 2023-12-17 NOTE — Progress Notes (Signed)
  Last Depo-Provera : 10/01/23. Side Effects if any: None . Serum HCG indicated? N/A. Depo-Provera  150 mg IM given by: Toya l CMA II. Next appointment due 11/24/-12-8.   Refill sent for Depo.

## 2023-12-20 ENCOUNTER — Ambulatory Visit

## 2024-01-03 ENCOUNTER — Encounter: Payer: Self-pay | Admitting: Adult Health

## 2024-01-03 ENCOUNTER — Telehealth: Admitting: Adult Health

## 2024-01-03 DIAGNOSIS — G4700 Insomnia, unspecified: Secondary | ICD-10-CM | POA: Diagnosis not present

## 2024-01-03 DIAGNOSIS — F419 Anxiety disorder, unspecified: Secondary | ICD-10-CM

## 2024-01-03 DIAGNOSIS — F319 Bipolar disorder, unspecified: Secondary | ICD-10-CM | POA: Diagnosis not present

## 2024-01-03 DIAGNOSIS — F3130 Bipolar disorder, current episode depressed, mild or moderate severity, unspecified: Secondary | ICD-10-CM

## 2024-01-03 DIAGNOSIS — F411 Generalized anxiety disorder: Secondary | ICD-10-CM

## 2024-01-03 MED ORDER — LURASIDONE HCL 40 MG PO TABS: 40.0000 mg | ORAL_TABLET | Freq: Every day | ORAL | 5 refills | Status: AC

## 2024-01-03 NOTE — Progress Notes (Signed)
 Anita Weaver [id number] 03/28/1976 48 y.o.  Virtual Visit via Video Note  I connected with pt @ on 01/03/24 at  3:00 PM EDT by a video enabled telemedicine application and verified that I am speaking with the correct person using two identifiers.   I discussed the limitations of evaluation and management by telemedicine and the availability of in person appointments. The patient expressed understanding and agreed to proceed.  I discussed the assessment and treatment plan with the patient. The patient was provided an opportunity to ask questions and all were answered. The patient agreed with the plan and demonstrated an understanding of the instructions.   The patient was advised to call back or seek an in-person evaluation if the symptoms worsen or if the condition fails to improve as anticipated.  I provided 15 minutes of non-face-to-face time during this encounter.  The patient was located at home.  The provider was located at [facility].   Angeline LOISE Sayers, NP   Subjective:   Patient ID:  Anita Weaver is a 48 y.o. (DOB [birth year]) female.  Chief Complaint: No chief complaint on file.   HPI Anita Weaver presents for follow-up of BPD 1, anxiety and insomnia.  Describes mood today as ok. Pleasant. Denies tearfulness. Mood symptoms - denies depression, anxiety, and irritability. Reports stable interest ad motivation. Denies panic attacks. Denies worry, rumination, and over thinking. Mood is stable. Stating I feel like I'm doing ok. Feels like the Latuda  at 40mg  daily works well for her. Taking medications as prescribed.  Energy levels not too bad. Active, does not have a regular exercise routine.  Enjoys some usual interests and activities. Married. Lives with husband. Has 3 grown sons - 3 grandchildren - sons. Parents live in [place]. Appetite adequate. Weight loss. Sleeps better some nights than others. Focus and concentration stable. Completing tasks.  Managing aspects of household. Works full time - preschool. Denies SI or HI.  Denies AH or VH. Denies self harm. Denies substance use.    Review of Systems:  Review of Systems  Musculoskeletal:  Negative for gait problem.  Neurological:  Negative for tremors.  Psychiatric/Behavioral:         Please refer to HPI    Medications: I have reviewed the patient's current medications.  Current Outpatient Medications  Medication Sig Dispense Refill   amLODipine  (NORVASC ) 10 MG tablet Take 1 tablet (10 mg total) by mouth daily. 90 tablet 1   cholecalciferol (VITAMIN D3) 25 MCG (1000 UNIT) tablet Take 1,000 Units by mouth daily. (Patient not taking: Reported on 10/29/2023)     famotidine (PEPCID) 40 MG tablet Take 40 mg by mouth daily.     levalbuterol  (XOPENEX  HFA) 45 MCG/ACT inhaler INHALE 2 PUFFS INTO THE LUNGS EVERY 8 (EIGHT) HOURS AS NEEDED FOR WHEEZING OR SHORTNESS OF BREATH. 15 each 2   losartan  (COZAAR ) 50 MG tablet Take 1 tablet (50 mg total) by mouth daily. 90 tablet 1   lurasidone  (LATUDA ) 40 MG TABS tablet Take 1 tablet (40 mg total) by mouth daily with breakfast. 30 tablet 5   medroxyPROGESTERone  (DEPO-PROVERA ) 150 MG/ML injection Inject 1 mL (150 mg total) into the muscle every 3 (three) months. 1 mL 4   medroxyPROGESTERone  (DEPO-PROVERA ) 150 MG/ML injection Inject 1 mL (150 mg total) into the muscle every 3 (three) months. 1 mL 3   nortriptyline  (PAMELOR ) 10 MG capsule Take 40 mg by mouth at bedtime.     ondansetron  (ZOFRAN ) 4 MG tablet Take  one tablet every 8 hours as needed. #10 to last 30 days. For migraines. 20 tablet 1   pantoprazole  (PROTONIX ) 40 MG tablet TAKE 1 TABLET BY MOUTH EVERY DAY 30 tablet 2   rizatriptan  (MAXALT ) 10 MG tablet Take 1 tablet (10 mg total) by mouth as needed for migraine. May repeat in 2 hours if needed 10 tablet 3   No current facility-administered medications for this visit.    Medication Side Effects: None  Allergies:  Allergies  Allergen  Reactions   Imitrex  [Sumatriptan ] Other (See Comments)    Heart palpitations, hot/cold sweats   Demerol [Meperidine] Other (See Comments)    hallucinations   Flagyl  [Metronidazole ] Hives   Motrin  [Ibuprofen ] Other (See Comments)    swelling    Past Medical History:  Diagnosis Date   Allergy    Arthritis    Asthma    Chronic pelvic pain in female    Endometriosis    FH: breast cancer in first degree relative    Mother and sister both are BRCA positive; patient is BRCA negative   GERD (gastroesophageal reflux disease)    Headache    migraines   Heart murmur    Hyperlipidemia    Hypertension    Interstitial cystitis    Irritable bowel disease    Neck pain     Family History  Problem Relation Age of Onset   Cancer Mother 86       breast   Breast cancer Mother 21   Cancer Sister 62   Breast cancer Sister 13   Kidney disease Son    Heart attack Maternal Grandfather    Heart attack Paternal Grandmother    Heart attack Paternal Grandfather     Social History   Socioeconomic History   Marital status: Married    Spouse name: Norleen   Number of children: 3   Years of education: 12   Highest education level: Not on file  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  Tobacco Use   Smoking status: Never    Passive exposure: Never   Smokeless tobacco: Never  Vaping Use   Vaping status: Never Used  Substance and Sexual Activity   Alcohol use: Yes    Alcohol/week: 0.0 standard drinks of alcohol    Comment: occasionally   Drug use: No   Sexual activity: Yes    Partners: Male    Birth control/protection: Surgical    Comment: tubaligation  Other Topics Concern   Not on file  Social History Narrative   ** Merged History Encounter **       ** Data from: 02/09/14 Enc Dept: [facility]       ** Data from: 11/10/13 Enc Dept: [facility]   Patient lives at home with her husband Vilinda)   Patient works full time.   Right handed.   Caffeine- none    Education- high school   Social Drivers of Corporate investment banker Strain: Not on file  Food Insecurity: No Food Insecurity (06/20/2023)   Hunger Vital Sign    Worried About Running Out of Food in the Last Year: Never true    Ran Out of Food in the Last Year: Never true  Transportation Needs: No Transportation Needs (06/20/2023)   PRAPARE - Administrator, Civil Service (Medical): No    Lack of Transportation (Non-Medical): No  Physical Activity: Not on file  Stress: Not on file  Social Connections: Not on file  Intimate Partner Violence: Not At Risk (06/20/2023)   Humiliation, Afraid, Rape, and Kick questionnaire    Fear of Current or Ex-Partner: No    Emotionally Abused: No    Physically Abused: No    Sexually Abused: No    Past Medical History, Surgical history, Social history, and Family history were reviewed and updated as appropriate.   Please see review of systems for further details on the patient's review from today.   Objective:   Physical Exam:  There were no vitals taken for this visit.  Physical Exam Constitutional:      General: She is not in acute distress. Musculoskeletal:        General: No deformity.  Neurological:     Mental Status: She is alert and oriented to person, place, and time.     Coordination: Coordination normal.  Psychiatric:        Attention and Perception: Attention and perception normal. She does not perceive auditory or visual hallucinations.        Mood and Affect: Mood normal. Mood is not anxious or depressed. Affect is not labile, blunt, angry or inappropriate.        Speech: Speech normal.        Behavior: Behavior normal.        Thought Content: Thought content normal. Thought content is not paranoid or delusional. Thought content does not include homicidal or suicidal ideation. Thought content does not include homicidal or suicidal plan.        Cognition and Memory: Cognition and memory normal.        Judgment:  Judgment normal.     Comments: Insight intact     Lab Review:     Component Value Date/Time   NA 137 06/20/2023 0542   NA 139 03/03/2013 1545   K 3.3 (L) 06/20/2023 0542   K 3.7 03/03/2013 1545   CL 105 06/20/2023 0542   CL 105 03/03/2013 1545   CO2 23 06/20/2023 0542   CO2 28 03/03/2013 1545   GLUCOSE 106 (H) 06/20/2023 0542   GLUCOSE 106 (H) 03/03/2013 1545   BUN 8 06/20/2023 0542   BUN 11 03/03/2013 1545   CREATININE 0.75 06/20/2023 0542   CREATININE 1.00 (H) 04/30/2023 0927   CALCIUM 8.5 (L) 06/20/2023 0542   CALCIUM 8.8 03/03/2013 1545   PROT 8.1 06/19/2023 1746   PROT 7.3 03/03/2013 1545   ALBUMIN 4.1 06/19/2023 1746   ALBUMIN 3.8 03/03/2013 1545   AST 14 (L) 06/19/2023 1746   AST 8 (L) 03/03/2013 1545   ALT 22 06/19/2023 1746   ALT 15 03/03/2013 1545   ALKPHOS 52 06/19/2023 1746   ALKPHOS 41 (L) 03/03/2013 1545   BILITOT 1.0 06/19/2023 1746   BILITOT 0.4 03/03/2013 1545   GFRNONAA >60 06/20/2023 0542   GFRNONAA 79 05/14/2020 1532   GFRAA 91 05/14/2020 1532       Component Value Date/Time   WBC 8.7 06/20/2023 0542   RBC 3.89 06/20/2023 0542   HGB 11.7 (L) 06/20/2023 0542   HGB 13.8 03/03/2013 1545   HCT 33.0 (L) 06/20/2023 0542   HCT 40.2 03/03/2013 1545   PLT 346 06/20/2023 0542   PLT 288 03/03/2013 1545   MCV 84.8 06/20/2023 0542   MCV 85 03/03/2013 1545   MCH 30.1 06/20/2023 0542   MCHC 35.5 06/20/2023 0542   RDW 12.1 06/20/2023 0542   RDW 13.3 03/03/2013 1545   LYMPHSABS 3,039 04/24/2022 0837   LYMPHSABS 2.9 03/03/2013 1545  MONOABS 752 01/20/2016 1603   MONOABS 0.6 03/03/2013 1545   EOSABS 399 04/30/2023 0927   EOSABS 0.2 03/03/2013 1545   BASOSABS 67 04/30/2023 0927   BASOSABS 0.0 03/03/2013 1545    No results found for: POCLITH, LITHIUM   No results found for: PHENYTOIN, PHENOBARB, VALPROATE, CBMZ   .res Assessment: Plan:    Plan:  1. Latuda  40mg  daily   RTC 6 months  15 minutes spent dedicated to the care of  this patient on the date of this encounter to include pre-visit review of records, ordering of medication, post visit documentation, and face-to-face time with the patient discussing BPD 1, anxiety and insomnia. Discussed continuing current medication regimen.  Patient advised to contact office with any questions, adverse effects, or acute worsening in signs and symptoms.  Discussed potential metabolic side effects associated with atypical antipsychotics, as well as potential risk for movement side effects. Advised pt to contact office if movement side effects occur.   There are no diagnoses linked to this encounter.   Please see After Visit Summary for patient specific instructions.  Future Appointments  Date Time Provider Department Center  01/03/2024  3:00 PM Kameron Glazebrook, Angeline Mattocks, NP [facility] None  03/03/2024  2:50 PM [facility] NURSE [facility]  04/30/2024  8:20 AM Bernardo Fend, DO [facility]    No orders of the defined types were placed in this encounter.     -------------------------------

## 2024-01-07 ENCOUNTER — Encounter: Payer: Self-pay | Admitting: Internal Medicine

## 2024-01-07 DIAGNOSIS — L299 Pruritus, unspecified: Secondary | ICD-10-CM

## 2024-01-13 ENCOUNTER — Encounter: Payer: Self-pay | Admitting: Internal Medicine

## 2024-01-14 ENCOUNTER — Other Ambulatory Visit: Payer: Self-pay | Admitting: Internal Medicine

## 2024-01-14 DIAGNOSIS — J4520 Mild intermittent asthma, uncomplicated: Secondary | ICD-10-CM

## 2024-01-14 MED ORDER — LEVALBUTEROL TARTRATE 45 MCG/ACT IN AERO: 2.0000 | INHALATION_SPRAY | Freq: Three times a day (TID) | RESPIRATORY_TRACT | 2 refills | Status: AC | PRN

## 2024-01-21 ENCOUNTER — Other Ambulatory Visit: Payer: Self-pay

## 2024-01-21 DIAGNOSIS — R0600 Dyspnea, unspecified: Secondary | ICD-10-CM

## 2024-01-21 MED ORDER — PANTOPRAZOLE SODIUM 40 MG PO TBEC: 40.0000 mg | DELAYED_RELEASE_TABLET | Freq: Every day | ORAL | 1 refills | Status: DC

## 2024-03-03 ENCOUNTER — Ambulatory Visit

## 2024-03-03 DIAGNOSIS — Z3042 Encounter for surveillance of injectable contraceptive: Secondary | ICD-10-CM | POA: Diagnosis not present

## 2024-03-03 MED ORDER — MEDROXYPROGESTERONE ACETATE 150 MG/ML IM SUSY
150.0000 mg | PREFILLED_SYRINGE | Freq: Once | INTRAMUSCULAR | Status: AC
  Administered 2024-03-03: 150 mg via INTRAMUSCULAR

## 2024-03-03 NOTE — Progress Notes (Signed)
  Last Depo-Provera : 12/17/2023. Side Effects if any: None . Serum HCG indicated? N/A. Depo-Provera  150 mg IM given by: Kendra Grissett L CMA II. Next appointment due 02/09-02/23.

## 2024-03-09 ENCOUNTER — Other Ambulatory Visit: Payer: Self-pay | Admitting: Gastroenterology

## 2024-03-09 DIAGNOSIS — R0600 Dyspnea, unspecified: Secondary | ICD-10-CM

## 2024-04-14 ENCOUNTER — Ambulatory Visit: Payer: Self-pay

## 2024-04-14 NOTE — Telephone Encounter (Signed)
 Returned pt call and she will go to urgent care because we are currently still booked.

## 2024-04-14 NOTE — Telephone Encounter (Signed)
" °  FYI Only or Action Required?: FYI only for provider: declined appt tomorrow at other facility, trying to get appt together with husband.  Patient was last seen in primary care on 10/29/2023 by Bernardo Fend, DO.  Called Nurse Triage reporting Cough.  Symptoms began several weeks ago.  Interventions attempted: OTC medications: delsum.  Symptoms are: stable.  Triage Disposition: See PCP When Office is Open (Within 3 Days)  Patient/caregiver understands and will follow disposition?: Yes  Copied from CRM #[id number]. Topic: Clinical - Red Word Triage >> Apr 14, 2024  8:07 AM Kendel B wrote: Red Word that prompted transfer to Nurse Triage: Headache, cough, chest pain Reason for Disposition  Cough has been present for > 3 weeks  Answer Assessment - Initial Assessment Questions 1. ONSET: When did the cough begin?      3 weeks ago 2. SEVERITY: How bad is the cough today?      *No Answer* 3. SPUTUM: Describe the color of your sputum (e.g., none, dry cough; clear, white, yellow, green)     denies 4. HEMOPTYSIS: Are you coughing up any blood? If Yes, ask: How much? (e.g., flecks, streaks, tablespoons, etc.)     no 5. DIFFICULTY BREATHING: Are you having difficulty breathing? If Yes, ask: How bad is it? (e.g., mild, moderate, severe)      denies 6. FEVER: Do you have a fever? If Yes, ask: What is your temperature, how was it measured, and when did it start?     *No Answer* 7. CARDIAC HISTORY: Do you have any history of heart disease? (e.g., heart attack, congestive heart failure)      no 8. LUNG HISTORY: Do you have any history of lung disease?  (e.g., pulmonary embolus, asthma, emphysema)     no 9. PE RISK FACTORS: Do you have a history of blood clots? (or: recent major surgery, recent prolonged travel, bedridden)     no 10. OTHER SYMPTOMS: Do you have any other symptoms? (e.g., runny nose, wheezing, chest pain)       no 11. PREGNANCY: Is there any chance you  are pregnant? When was your last menstrual period?       *No Answer* 12. TRAVEL: Have you traveled out of the country in the last month? (e.g., travel history, exposures)       *No Answer*  Protocols used: Cough - Acute Non-Productive-A-AH  "

## 2024-04-21 ENCOUNTER — Other Ambulatory Visit: Payer: Self-pay | Admitting: Internal Medicine

## 2024-04-21 DIAGNOSIS — Z1231 Encounter for screening mammogram for malignant neoplasm of breast: Secondary | ICD-10-CM

## 2024-04-28 ENCOUNTER — Ambulatory Visit: Admitting: Internal Medicine

## 2024-04-28 ENCOUNTER — Other Ambulatory Visit: Payer: Self-pay

## 2024-04-28 ENCOUNTER — Encounter: Payer: Self-pay | Admitting: Internal Medicine

## 2024-04-28 VITALS — BP 128/74 | HR 98 | Temp 98.4°F | Resp 16 | Ht 68.0 in | Wt 222.6 lb

## 2024-04-28 DIAGNOSIS — R110 Nausea: Secondary | ICD-10-CM

## 2024-04-28 DIAGNOSIS — Z13220 Encounter for screening for lipoid disorders: Secondary | ICD-10-CM | POA: Diagnosis not present

## 2024-04-28 DIAGNOSIS — I10 Essential (primary) hypertension: Secondary | ICD-10-CM | POA: Diagnosis not present

## 2024-04-28 LAB — COMPREHENSIVE METABOLIC PANEL WITH GFR
AG Ratio: 1.4 (calc) (ref 1.0–2.5)
ALT: 22 U/L (ref 6–29)
AST: 12 U/L (ref 10–35)
Albumin: 4.4 g/dL (ref 3.6–5.1)
Alkaline phosphatase (APISO): 75 U/L (ref 31–125)
BUN: 7 mg/dL (ref 7–25)
CO2: 26 mmol/L (ref 20–32)
Calcium: 9.5 mg/dL (ref 8.6–10.2)
Chloride: 103 mmol/L (ref 98–110)
Creat: 0.85 mg/dL (ref 0.50–0.99)
Globulin: 3.2 g/dL (ref 1.9–3.7)
Glucose, Bld: 103 mg/dL — ABNORMAL HIGH (ref 65–99)
Potassium: 3.8 mmol/L (ref 3.5–5.3)
Sodium: 139 mmol/L (ref 135–146)
Total Bilirubin: 0.8 mg/dL (ref 0.2–1.2)
Total Protein: 7.6 g/dL (ref 6.1–8.1)
eGFR: 84 mL/min/1.73m2

## 2024-04-28 LAB — LIPID PANEL
Cholesterol: 181 mg/dL
HDL: 34 mg/dL — ABNORMAL LOW
LDL Cholesterol (Calc): 120 mg/dL — ABNORMAL HIGH
Non-HDL Cholesterol (Calc): 147 mg/dL — ABNORMAL HIGH
Total CHOL/HDL Ratio: 5.3 (calc) — ABNORMAL HIGH
Triglycerides: 154 mg/dL — ABNORMAL HIGH

## 2024-04-28 LAB — CBC WITH DIFFERENTIAL/PLATELET
Absolute Lymphocytes: 3278 {cells}/uL (ref 850–3900)
Absolute Monocytes: 627 {cells}/uL (ref 200–950)
Basophils Absolute: 38 {cells}/uL (ref 0–200)
Basophils Relative: 0.4 %
Eosinophils Absolute: 200 {cells}/uL (ref 15–500)
Eosinophils Relative: 2.1 %
HCT: 45.6 % (ref 35.9–46.0)
Hemoglobin: 15.3 g/dL (ref 11.7–15.5)
MCH: 29.1 pg (ref 27.0–33.0)
MCHC: 33.6 g/dL (ref 31.6–35.4)
MCV: 86.9 fL (ref 81.4–101.7)
MPV: 8.8 fL (ref 7.5–12.5)
Monocytes Relative: 6.6 %
Neutro Abs: 5358 {cells}/uL (ref 1500–7800)
Neutrophils Relative %: 56.4 %
Platelets: 508 Thousand/uL — ABNORMAL HIGH (ref 140–400)
RBC: 5.25 Million/uL — ABNORMAL HIGH (ref 3.80–5.10)
RDW: 12.8 % (ref 11.0–15.0)
Total Lymphocyte: 34.5 %
WBC: 9.5 Thousand/uL (ref 3.8–10.8)

## 2024-04-28 MED ORDER — LOSARTAN POTASSIUM 50 MG PO TABS: 50.0000 mg | ORAL_TABLET | Freq: Every day | ORAL | 1 refills | Status: AC

## 2024-04-28 MED ORDER — ONDANSETRON HCL 4 MG PO TABS: ORAL_TABLET | ORAL | 1 refills | Status: AC

## 2024-04-28 MED ORDER — AMLODIPINE BESYLATE 10 MG PO TABS: 10.0000 mg | ORAL_TABLET | Freq: Every day | ORAL | 1 refills | Status: AC

## 2024-04-28 NOTE — Progress Notes (Signed)
 "  Established Patient Office Visit  Subjective:  Patient ID: Anita Weaver, female    DOB: 1975-04-14  Age: 49 y.o. MRN: [id number]  CC:  Chief Complaint  Patient presents with   Medical Management of Chronic Issues    6 month recheck-    HPI Anita Weaver presents for follow up.   Discussed the use of AI scribe software for clinical note transcription with the patient, who gave verbal consent to proceed.  History of Present Illness  Anita Weaver is a 49 year old female who presents for a follow-up visit after recovering from the flu.  She had influenza recently and has been feeling better for over a week. She takes vitamin C during recovery and denies ongoing flu-related concerns.  She takes pantoprazole , inhalers, amlodipine , losartan , and Zofran . She uses Zofran  about once every two weeks for nausea, which she relates to stomach issues after gallbladder removal. She is not on any current weight loss medication.  She uses Depo-Provera  and Latuda  without reported problems. Her Maxalt  was recently refilled by another provider.  She had an EKG in 2023 with a heart rate of 100 and notes her heart rate today is about 98, without recalling a specific prior indication.  She plans to complete fasting labs including cholesterol, kidney and liver function, electrolytes, and thyroid tests. Her A1c was 4.9 in May and she is not due for repeat unless glucose levels rise.   Hypertension: -Medications: Amlodipine  10 mg, Losartan  50 mg  -Failed Meds: thought Lisinopril due to cough but ended up being PND -Patient is compliant with above medications and reports no side effects. -Checking BP at home (average): stable -Denies any SOB, vision changes, LE edema or symptoms of hypotension.  -Diagnosed with OSA 2/23 - cannot tolerate CPAP, not currently treating but feels like she is not getting good quality of air when she is laying flat  HLD: -Medications: Nothing currently  -Last lipid  panel: Lipid Panel     Component Value Date/Time   CHOL 204 (H) 04/30/2023 0927   TRIG 183 (H) 04/30/2023 0927   HDL 40 (L) 04/30/2023 0927   CHOLHDL 5.1 (H) 04/30/2023 0927   VLDL 21 05/24/2015 0839   LDLCALC 132 (H) 04/30/2023 0927   The 10-year ASCVD risk score (Arnett DK, et al., 2019) is: 2.2%   Values used to calculate the score:     Age: 16 years     Clinically relevant sex: Female     Is Non-Hispanic African American: No     Diabetic: No     Tobacco smoker: No     Systolic Blood Pressure: 128 mmHg     Is BP treated: Yes     HDL Cholesterol: 40 mg/dL     Total Cholesterol: 204 mg/dL  Migraines: -Maxalt  PRN, Nortriptyline  -Following with Neurology  Asthma/Allergies: -Currently on Albuterol  PRN and Flonase   GERD: -Currently on Pepcid 40 mg, Protonix  40 mg  Health Maintenance: -Blood work due -Pap in 1/23, appointment in March -Mammogram 4/25 Birads-1, scheduled for April -Colonoscopy 12/23 - repeat 10 years   Past Medical History:  Diagnosis Date   Allergy    Arthritis    Asthma    Chronic pelvic pain in female    Endometriosis    FH: breast cancer in first degree relative    Mother and sister both are BRCA positive; patient is BRCA negative   GERD (gastroesophageal reflux disease)    Headache    migraines  Heart murmur    Hyperlipidemia    Hypertension    Interstitial cystitis    Irritable bowel disease    Neck pain     Past Surgical History:  Procedure Laterality Date   BREAST BIOPSY Left 02/08/2017   CHOLECYSTECTOMY     COLONOSCOPY WITH PROPOFOL  N/A 04/06/2022   Procedure: COLONOSCOPY WITH PROPOFOL ;  Surgeon: Jinny Carmine, MD;  Location: [facility];  Service: Endoscopy;  Laterality: N/A;   LAPAROSCOPIC APPENDECTOMY N/A 06/20/2023   Procedure: APPENDECTOMY, LAPAROSCOPIC;  Surgeon: Jordis Laneta FALCON, MD;  Location: [facility];  Service: General;  Laterality: N/A;   LAPAROSCOPIC LYSIS OF ADHESIONS  06/20/2023   Procedure: LYSIS, ADHESIONS,  LAPAROSCOPIC;  Surgeon: Jordis Laneta FALCON, MD;  Location: [facility];  Service: General;;   LAPAROSCOPY  2006   TUBAL LIGATION      Family History  Problem Relation Age of Onset   Cancer Mother 18       breast   Breast cancer Mother 53   Cancer Sister 86   Breast cancer Sister 40   Kidney disease Son    Heart attack Maternal Grandfather    Heart attack Paternal Grandmother    Heart attack Paternal Grandfather     Social History   Socioeconomic History   Marital status: Married    Spouse name: Norleen   Number of children: 3   Years of education: 12   Highest education level: Not on file  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  Tobacco Use   Smoking status: Never    Passive exposure: Never   Smokeless tobacco: Never  Vaping Use   Vaping status: Never Used  Substance and Sexual Activity   Alcohol use: Yes    Alcohol/week: 0.0 standard drinks of alcohol    Comment: occasionally   Drug use: No   Sexual activity: Yes    Partners: Male    Birth control/protection: Surgical    Comment: tubaligation  Other Topics Concern   Not on file  Social History Narrative   ** Merged History Encounter **       ** Data from: 02/09/14 Enc Dept: [facility]       ** Data from: 11/10/13 Enc Dept: [facility]   Patient lives at home with her husband Vilinda)   Patient works full time.   Right handed.   Caffeine- none   Education- high school   Social Drivers of Health   Tobacco Use: Low Risk (04/28/2024)   Patient History    Smoking Tobacco Use: Never    Smokeless Tobacco Use: Never    Passive Exposure: Never  Financial Resource Strain: Not on file  Food Insecurity: No Food Insecurity (06/20/2023)   Hunger Vital Sign    Worried About Running Out of Food in the Last Year: Never true    Ran Out of Food in the Last Year: Never true  Transportation Needs: No Transportation Needs (06/20/2023)   PRAPARE - Administrator, Civil Service (Medical): No    Lack of  Transportation (Non-Medical): No  Physical Activity: Not on file  Stress: Not on file  Social Connections: Not on file  Intimate Partner Violence: Not At Risk (06/20/2023)   Humiliation, Afraid, Rape, and Kick questionnaire    Fear of Current or Ex-Partner: No    Emotionally Abused: No    Physically Abused: No    Sexually Abused: No  Depression (PHQ2-9): Low Risk (04/28/2024)   Depression (PHQ2-9)  PHQ-2 Score: 0  Alcohol Screen: Low Risk (04/24/2022)   Alcohol Screen    Last Alcohol Screening Score (AUDIT): 0  Housing: Low Risk (06/20/2023)   Housing Stability Vital Sign    Unable to Pay for Housing in the Last Year: No    Number of Times Moved in the Last Year: 0    Homeless in the Last Year: No  Utilities: Not At Risk (06/20/2023)   [facility] Utilities    Threatened with loss of utilities: No  Health Literacy: Not on file    Outpatient Medications Prior to Visit  Medication Sig Dispense Refill   amLODipine  (NORVASC ) 10 MG tablet Take 1 tablet (10 mg total) by mouth daily. 90 tablet 1   famotidine (PEPCID) 40 MG tablet Take 40 mg by mouth daily.     levalbuterol  (XOPENEX  HFA) 45 MCG/ACT inhaler Inhale 2 puffs into the lungs every 8 (eight) hours as needed for wheezing or shortness of breath. 15 each 2   losartan  (COZAAR ) 50 MG tablet Take 1 tablet (50 mg total) by mouth daily. 90 tablet 1   lurasidone  (LATUDA ) 40 MG TABS tablet Take 1 tablet (40 mg total) by mouth daily with breakfast. 30 tablet 5   medroxyPROGESTERone  (DEPO-PROVERA ) 150 MG/ML injection Inject 1 mL (150 mg total) into the muscle every 3 (three) months. 1 mL 4   nortriptyline  (PAMELOR ) 10 MG capsule Take 40 mg by mouth at bedtime.     ondansetron  (ZOFRAN ) 4 MG tablet Take one tablet every 8 hours as needed. #10 to last 30 days. For migraines. 20 tablet 1   pantoprazole  (PROTONIX ) 40 MG tablet TAKE 1 TABLET BY MOUTH EVERY DAY 90 tablet 0   rizatriptan  (MAXALT ) 10 MG tablet Take 1 tablet (10 mg total) by mouth as needed  for migraine. May repeat in 2 hours if needed 10 tablet 3   cholecalciferol (VITAMIN D3) 25 MCG (1000 UNIT) tablet Take 1,000 Units by mouth daily. (Patient not taking: Reported on 10/29/2023)     medroxyPROGESTERone  (DEPO-PROVERA ) 150 MG/ML injection Inject 1 mL (150 mg total) into the muscle every 3 (three) months. 1 mL 3   No facility-administered medications prior to visit.    Allergies  Allergen Reactions   Imitrex  [Sumatriptan ] Other (See Comments)    Heart palpitations, hot/cold sweats   Demerol [Meperidine] Other (See Comments)    hallucinations   Flagyl  [Metronidazole ] Hives   Motrin  [Ibuprofen ] Other (See Comments)    swelling    ROS Review of Systems  All other systems reviewed and are negative.     Objective:    Physical Exam Constitutional:      Appearance: Normal appearance.  HENT:     Head: Normocephalic and atraumatic.     Mouth/Throat:     Mouth: Mucous membranes are moist.     Pharynx: Oropharynx is clear.  Eyes:     Extraocular Movements: Extraocular movements intact.     Conjunctiva/sclera: Conjunctivae normal.     Pupils: Pupils are equal, round, and reactive to light.  Cardiovascular:     Rate and Rhythm: Regular rhythm. Tachycardia present.  Pulmonary:     Effort: Pulmonary effort is normal.     Breath sounds: Normal breath sounds.  Skin:    General: Skin is warm and dry.  Neurological:     General: No focal deficit present.     Mental Status: She is alert. Mental status is at baseline.  Psychiatric:        Mood and  Affect: Mood normal.        Behavior: Behavior normal.     BP 128/74 (Cuff Size: Large)   Pulse 98   Temp 98.4 F (36.9 C) (Oral)   Resp 16   Ht 5' 8 (1.727 m)   Wt 222 lb 9.6 oz (101 kg)   SpO2 99%   BMI 33.85 kg/m  Wt Readings from Last 3 Encounters:  04/28/24 222 lb 9.6 oz (101 kg)  10/29/23 222 lb (100.7 kg)  07/11/23 211 lb (95.7 kg)     Health Maintenance Due  Topic Date Due   Hepatitis B Vaccines 19-59  Average Risk (1 of 3 - 19+ 3-dose series) Never done   Cervical Cancer Screening (HPV/Pap Cotest)  12/06/2019   Influenza Vaccine  Never done   COVID-19 Vaccine (2 - 2025-26 season) 12/10/2023       Topic Date Due   Hepatitis B Vaccines 19-59 Average Risk (1 of 3 - 19+ 3-dose series) Never done    Lab Results  Component Value Date   TSH 1.44 04/30/2023   Lab Results  Component Value Date   WBC 8.7 06/20/2023   HGB 11.7 (L) 06/20/2023   HCT 33.0 (L) 06/20/2023   MCV 84.8 06/20/2023   PLT 346 06/20/2023   Lab Results  Component Value Date   NA 137 06/20/2023   K 3.3 (L) 06/20/2023   CO2 23 06/20/2023   GLUCOSE 106 (H) 06/20/2023   BUN 8 06/20/2023   CREATININE 0.75 06/20/2023   BILITOT 1.0 06/19/2023   ALKPHOS 52 06/19/2023   AST 14 (L) 06/19/2023   ALT 22 06/19/2023   PROT 8.1 06/19/2023   ALBUMIN 4.1 06/19/2023   CALCIUM 8.5 (L) 06/20/2023   ANIONGAP 9 06/20/2023   EGFR 70 04/30/2023   Lab Results  Component Value Date   CHOL 204 (H) 04/30/2023   Lab Results  Component Value Date   HDL 40 (L) 04/30/2023   Lab Results  Component Value Date   LDLCALC 132 (H) 04/30/2023   Lab Results  Component Value Date   TRIG 183 (H) 04/30/2023   Lab Results  Component Value Date   CHOLHDL 5.1 (H) 04/30/2023   Lab Results  Component Value Date   HGBA1C 4.9 04/30/2023      Assessment & Plan:   Assessment & Plan  Essential hypertension Blood pressure controlled at 128/74 mmHg. Borderline elevated heart rate at 98 bpm may cause anxiety-like symptoms. - Refilled amlodipine  and losartan  prescriptions. - Consider beta blocker if medication adjustment needed.  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 Zofran  used as needed. - Refilled Zofran  prescription.  Lipid screening Due for annual lipid screening. - Ordered lipid panel.  - CBC w/Diff/Platelet - Comprehensive Metabolic Panel (CMET) - amLODipine  (NORVASC ) 10 MG tablet;  Take 1 tablet (10 mg total) by mouth daily.  Dispense: 90 tablet; Refill: 1 - losartan  (COZAAR ) 50 MG tablet; Take 1 tablet (50 mg total) by mouth daily.  Dispense: 90 tablet; Refill: 1 - ondansetron  (ZOFRAN ) 4 MG tablet; Take one tablet every 8 hours as needed. #10 to last 30 days. For migraines.  Dispense: 20 tablet; Refill: 1 - Lipid Profile   Follow-up: Return in about 6 months (around 10/26/2024).    Sharyle Fischer, DO "

## 2024-04-29 ENCOUNTER — Ambulatory Visit: Admitting: Family Medicine

## 2024-04-29 ENCOUNTER — Ambulatory Visit: Payer: Self-pay | Admitting: Internal Medicine

## 2024-04-30 ENCOUNTER — Ambulatory Visit: Admitting: Internal Medicine

## 2024-05-26 ENCOUNTER — Ambulatory Visit

## 2024-06-10 ENCOUNTER — Ambulatory Visit: Admitting: Family Medicine

## 2024-07-02 ENCOUNTER — Telehealth: Admitting: Adult Health

## 2024-07-14 ENCOUNTER — Encounter

## 2024-10-27 ENCOUNTER — Ambulatory Visit: Admitting: Internal Medicine
